# Patient Record
Sex: Male | Born: 1950 | Race: White | Hispanic: No | Marital: Married | Smoking: Never smoker
Health system: Southern US, Community
[De-identification: ages and names within clinical notes are randomized; demographics above are authoritative.]

## PROBLEM LIST (undated history)

## (undated) DIAGNOSIS — Z973 Presence of spectacles and contact lenses: Secondary | ICD-10-CM

## (undated) DIAGNOSIS — R140 Abdominal distension (gaseous): Secondary | ICD-10-CM

## (undated) DIAGNOSIS — E7800 Pure hypercholesterolemia, unspecified: Secondary | ICD-10-CM

## (undated) DIAGNOSIS — R42 Dizziness and giddiness: Secondary | ICD-10-CM

## (undated) DIAGNOSIS — K219 Gastro-esophageal reflux disease without esophagitis: Secondary | ICD-10-CM

## (undated) DIAGNOSIS — R109 Unspecified abdominal pain: Secondary | ICD-10-CM

## (undated) DIAGNOSIS — K439 Ventral hernia without obstruction or gangrene: Secondary | ICD-10-CM

## (undated) DIAGNOSIS — C801 Malignant (primary) neoplasm, unspecified: Secondary | ICD-10-CM

## (undated) HISTORY — PX: CYSTECTOMY: SUR359

## (undated) HISTORY — PX: HERNIA REPAIR: SHX51

## (undated) HISTORY — DX: Dizziness and giddiness: R42

## (undated) HISTORY — PX: KNEE ARTHROSCOPY: SUR90

## (undated) HISTORY — DX: Unspecified abdominal pain: R10.9

## (undated) HISTORY — DX: Abdominal distension (gaseous): R14.0

## (undated) HISTORY — DX: Ventral hernia without obstruction or gangrene: K43.9

## (undated) HISTORY — DX: Gastro-esophageal reflux disease without esophagitis: K21.9

---

## 2001-05-31 DIAGNOSIS — D229 Melanocytic nevi, unspecified: Secondary | ICD-10-CM

## 2001-05-31 HISTORY — DX: Melanocytic nevi, unspecified: D22.9

## 2001-08-23 ENCOUNTER — Ambulatory Visit (HOSPITAL_COMMUNITY): Admission: RE | Admit: 2001-08-23 | Discharge: 2001-08-23 | Payer: Self-pay | Admitting: Pulmonary Disease

## 2006-02-24 ENCOUNTER — Ambulatory Visit (HOSPITAL_COMMUNITY): Admission: RE | Admit: 2006-02-24 | Discharge: 2006-02-24 | Payer: Self-pay | Admitting: Pulmonary Disease

## 2006-03-05 ENCOUNTER — Ambulatory Visit (HOSPITAL_COMMUNITY): Admission: RE | Admit: 2006-03-05 | Discharge: 2006-03-05 | Payer: Self-pay | Admitting: Pulmonary Disease

## 2006-03-25 DIAGNOSIS — C4492 Squamous cell carcinoma of skin, unspecified: Secondary | ICD-10-CM

## 2006-03-25 HISTORY — DX: Squamous cell carcinoma of skin, unspecified: C44.92

## 2006-10-26 ENCOUNTER — Emergency Department (HOSPITAL_COMMUNITY): Admission: EM | Admit: 2006-10-26 | Discharge: 2006-10-27 | Payer: Self-pay | Admitting: Emergency Medicine

## 2006-10-28 ENCOUNTER — Encounter (INDEPENDENT_AMBULATORY_CARE_PROVIDER_SITE_OTHER): Payer: Self-pay | Admitting: Specialist

## 2006-10-28 ENCOUNTER — Ambulatory Visit (HOSPITAL_COMMUNITY): Admission: RE | Admit: 2006-10-28 | Discharge: 2006-10-28 | Payer: Self-pay | Admitting: General Surgery

## 2007-04-14 ENCOUNTER — Ambulatory Visit: Payer: Self-pay | Admitting: Orthopedic Surgery

## 2007-04-28 ENCOUNTER — Ambulatory Visit: Payer: Self-pay | Admitting: Orthopedic Surgery

## 2008-07-17 ENCOUNTER — Ambulatory Visit (HOSPITAL_COMMUNITY): Admission: RE | Admit: 2008-07-17 | Discharge: 2008-07-17 | Payer: Self-pay | Admitting: Pulmonary Disease

## 2008-08-30 ENCOUNTER — Ambulatory Visit (HOSPITAL_COMMUNITY): Admission: RE | Admit: 2008-08-30 | Discharge: 2008-08-30 | Payer: Self-pay | Admitting: Internal Medicine

## 2008-08-30 ENCOUNTER — Ambulatory Visit: Payer: Self-pay | Admitting: Internal Medicine

## 2010-02-21 ENCOUNTER — Encounter: Payer: Self-pay | Admitting: Internal Medicine

## 2010-03-13 ENCOUNTER — Ambulatory Visit (HOSPITAL_COMMUNITY): Admission: RE | Admit: 2010-03-13 | Discharge: 2010-03-13 | Payer: Self-pay | Admitting: Internal Medicine

## 2010-03-13 ENCOUNTER — Ambulatory Visit: Payer: Self-pay | Admitting: Internal Medicine

## 2010-10-04 ENCOUNTER — Emergency Department (HOSPITAL_COMMUNITY)
Admission: EM | Admit: 2010-10-04 | Discharge: 2010-10-04 | Payer: Self-pay | Source: Home / Self Care | Admitting: Emergency Medicine

## 2010-11-21 NOTE — Letter (Signed)
Summary: TRIAGE ORDER  TRIAGE ORDER   Imported By: Ave Filter 02/21/2010 11:42:22  _____________________________________________________________________  External Attachment:    Type:   Image     Comment:   External Document  Appended Document: TRIAGE ORDER ok

## 2011-03-04 NOTE — Op Note (Signed)
Jeffrey [name], Jeffrey [name] NO.:  [id number]   MEDICAL RECORD NO.:  [id number]          PATIENT TYPE:  AMB   LOCATION:  DAY                           FACILITY:  [facility]   PHYSICIAN:  R. Roetta Sessions, M.D. DATE OF BIRTH:  1951-10-06   DATE OF PROCEDURE:  08/30/2008  DATE OF DISCHARGE:                               OPERATIVE REPORT   INDICATIONS FOR PROCEDURE:  A 60 year old gentleman with no lower GI  tract symptoms who comes referral from Dr. Juanetta Gosling for colorectal cancer  screening.  He has never had his lower GI tract imaged.  There is no  family history of polyps or colon cancer.  The risks, benefits,  alternatives, and limitations have been reviewed.  Questions are  answered.  Please see the documentation in the medical record.   PROCEDURE NOTE:  O2 saturation, blood pressure, pulse, and respirations  were monitored throughout the entire procedure.  Conscious sedation of  Versed 4 mg IV, Demerol 100 mg IV in divided doses.   INSTRUMENT:  Pentax video chip system.   FINDINGS:  Digital rectal exam revealed no abnormalities.   Endoscopic findings:  The prep was good.   Colon:  Colonic mucosa was surveyed from the rectosigmoid junction  through the left transverse, right colon to the appendiceal orifice,  ileocecal valve, and cecum.  These structures were well seen and  photographed for the record.  Terminal ileum was intubated to 10 cm.  From this level, scope was slowly and cautiously withdrawn.  All  previously mentioned mucosal surfaces were again seen.  The patient had  few scattered shallow sigmoid diverticula in colonic mucosa.  Terminal  ileum mucosa appeared normal.  The scope was pulled down into the rectum  where thorough examination of rectal mucosa including retroflexion and  anteversion demonstrated no abnormalities, confirmed small anal  dilatation.  He tolerated the procedure well and was a reactive  endoscopy.   IMPRESSION:  1. Small anal  papilla, otherwise normal rectum.  2. A few shallow sigmoid diverticula in colonic mucosa.  Terminal      ileal mucosa appeared normal.   RECOMMENDATIONS:  1. Diverticulosis literature provided to Mr. Neighbors.  2. Repeat screening colonoscopy in 10 years.      Jonathon Bellows, M.D.  Electronically Signed     RMR/MEDQ  D:  08/30/2008  T:  08/30/2008  Job:  [id number]   cc:   Ramon Dredge L. Juanetta Gosling, M.D.  Fax: [phone]

## 2011-03-07 NOTE — H&P (Signed)
NAMEJUNAID, [name] NO.:  [id number]   MEDICAL RECORD NO.:  [id number]          PATIENT TYPE:  AMB   LOCATION:  SDS                          FACILITY:  [facility]   PHYSICIAN:  Anselm Pancoast. Weatherly, M.D.DATE OF BIRTH:  1951/09/24   DATE OF ADMISSION:  10/28/2006  DATE OF DISCHARGE:  10/28/2006                              HISTORY & PHYSICAL   CHIEF COMPLAINT:  Pain at the umbilicus.   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  He said, about 3 weeks ago, he  started noticing a little bulge at his umbilicus that was intermittently  painful.  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  He treated himself with mild pain medicine that evening,  started having more pain, and presented to the emergency room in  [place] where they did extensive lab work and a CT of the abdomen.  The CT showed a little, small umbilical hernia with incarcerated  preperitoneal fat.  There was no evidence of any loops of small bowel or  other abnormalities.  Some stool in his colon, but no diverticulitis.  He does have an enlarged prostate, and his bladder was not all prominent  when he had the CT.  Lab studies showed a normal white count, a negative  urine and CMET was negative, and they advised that he see a surgeon  promptly, and Dr. Juanetta Gosling suggested that he see [place] and was seen in the  urgent office yesterday.   EXAM:  On exam, he was acting as if he was having more pain, but it is  all real localized at the umbilicus, and you could reduce the little  preperitoneal fatty mass, but with straining, it would pop back out  again, and he desired to proceed on with a fairly urgent  umbilical  hernia repair.   HISTORY:  He has been on Flomax previously, and he is now on Avodart.  Says he gets up 1-2 times at night, thinks that he can void a  fairly  significant quantity, but he certainly is noticing a very low stream  flow and etc,.  I discussed about that he needed to see a urologist, as  I am wondering if the straining from that could be contributing to the  little hernia.  He felt that his symptoms were bad enough that he needed  to go ahead and proceed with the hernia repair and then if he is still  having symptoms, then see a urologist.  I did get a urine culture on him  this morning, prior to the repair of the hernia.   CURRENT MEDICATIONS:  He is on Avodart 0.5 p.o. in the morning.  He took  his tablet this morning.  Advil p.r.n.  Percocet that  he has been for  the last 36 hours for the hernia, that was prescribed in the ER.  I  discussed with him that the Percocet frequently makes it very difficult  to void and would try to get off the medicine as soon as possible.   PAST MEDICAL HISTORY:  He has no history of cardiac or hypertensive  issues.  The medicines are listed above.   FAMILY HISTORY:  I think his parents had cardiac issues.   PAST SURGICAL HISTORY:  I do not think he has had any previous  surgeries.   SOCIAL HISTORY:  His wife was with him when I saw him yesterday.   PHYSICAL EXAM:  His temperature was 98.3, pulse 72, respirations 16.  Blood pressure is 130/90.  He is 5 feet 8 inches, weighs 185 pounds.  Eyes, ears, nose and throat are unremarkable.  He is well-hydrated.  Lungs are clear.  Cardiac:  Normal sinus rhythm.  Abdomen:  He has a  soft abdomen with good bowel sounds and has voided today without  problems.  He has been n.p.o. since midnight.   IMPRESSION:  1. Small symptomatic umbilical hernia.  2. History of benign prostatic hypertrophy, on Avodart.   PLAN:  The patient will have repair of this umbilical hernia today with   general anesthesia with local in the wound and hopefully, can be  released after OR, providing that he is voiding without problems.  He  understands that he should not be doing any strenuous lifting, etc, for  approximately 3 weeks following the surgery.           ______________________________  Anselm Pancoast. Zachery Dakins, M.D.     WJW/MEDQ  D:  10/28/2006  T:  10/29/2006  Job:  [id number]

## 2011-03-07 NOTE — Op Note (Signed)
Jeffrey, [name] NO.:  [id number]   MEDICAL RECORD NO.:  [id number]          PATIENT TYPE:  AMB   LOCATION:  SDS                          FACILITY:  [facility]   PHYSICIAN:  Anselm Pancoast. Weatherly, M.D.DATE OF BIRTH:  16-Mar-1951   DATE OF PROCEDURE:  10/28/2006  DATE OF DISCHARGE:  10/28/2006                               OPERATIVE REPORT   PREOPERATIVE DIAGNOSIS:  Small incarcerated umbilical hernia.   POSTOPERATIVE DIAGNOSIS:  Small incarcerated umbilical hernia.   OPERATION:  Repair of small incarcerated umbilical hernia.   SURGEON:  Anselm Pancoast. Zachery Dakins, M.D.   ASSISTANT:  Nurse.   ANESTHESIA:  General anesthesia.   HISTORY:  Jeffrey Witherington. [name] is a 60 year old male, who was referred to our  office from the emergency room at [facility] 2 nights earlier, after he  presented their with a pain right at his navel with the following  history.  He runs an exercise center.  He does not really do real  strenuous activity, he said; but approximately 3 weeks earlier, he had  noticed a little bulge at his umbilicus that would intermittently swell  up, and then it would kind of resolve.  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  And as this became more painful and was definitely swollen, he presented  to the emergency room, where he was seen in the early a.m. 2 days  earlier.  They did extensive blood work and urinalysis and a CT that  showed a small incarcerated umbilical hernia,  about the size of a  grape.  There was no bowel up in the area.  There were no diverticulitis  or other problems.  He does have a past history of prostate problems,  for which he is on Avodart now.  He has been on Flomax previously, and  definitely he is having kind of BPH-type symptoms intermittently.  He  says that he voids about 1 time per night, and he  says there has not  really been any actual change of this.  I saw him in the office  yesterday and could reduce the little hernia.  When he strains and  pushes it back out, he is symptomatic enough that he wanted to proceed  on with urgent repair.  I have some concern about the prostate issue,  but on reviewing the CT with the radiologist this morning, he definitely  has an enlarged prostate.  He does not have dilatation of his ureters or  calices.  He has got some cysts, more on the left than right, in the  center portion of his kidneys, and I did get a clean-catch urine for  culture and sensitivity this morning when he came in.   DESCRIPTION OF PROCEDURE:  Preoperatively, the patient was taken to the  operating suite.  We identified him and he had received 1 g of Kefzol.  Induction of general anesthesia, endotracheal tube.  And  then the  abdomen, after clipping the hair around the umbilicus, was prepped with  Betadine Surgical Scrubbing Solution and draped in a sterile manner.  A  small transverse incision below the umbilicus was made.  The hernia sac  was separated from the surrounding skin over the umbilicus, and there  was a well-developed little hernia sac with incarcerated preperitoneal  fat in the area.  This was separated circumferentially, and the actual  little area of fatty tissue that was protruding was actually amputated,  ligating the pedicle with a 3-0 Vicryl.  The hernia sac was likewise  removed.  This defect itself was about the size of a small fingernail,  or a centimeter size, and I elected not to place any mesh.  I did place  transversely interrupted sutures of 0 Surgilon.  The subcutaneous tissue  was closed with 3-0 Vicryl to pull the skin down at the umbilicus, and I  had anesthetized the fascia and the peritoneum with about 10 mL  Sensorcaine 0.25%, and then also used the same in the subcutaneous  tissue.  Three subcutaneous stitches were placed, and then the  skin was  closed with intermittent 5-0 nylon.  I put antibiotic ointment on the  sutures, and then kind of rolled up a 4 x 4 to kind of press the  umbilicus, and then 4 x 4's over this.  If the patient can void without  problems in the recovery room, he can be released today; and he is aware  that he should tolerate a normal amount of fluids - do not over do it -  since he certainly is having problems with his prostate.  Dr. Kari Baars in [place] is his regular physician, but it may be necessary  that he see a urologist if he continues to have more symptoms with his  prostate.  The patient is 60 years of old, and, of course, had an  enlarged prostate on the CT.           ______________________________  Anselm Pancoast. Zachery Dakins, M.D.     WJW/MEDQ  D:  10/28/2006  T:  10/28/2006  Job:  [id number]   cc:   Jeffrey [name], M.D.

## 2011-07-07 NOTE — Progress Notes (Unsigned)
Per RMR. Ok to call in zegerid 40mg  qd #30 [id number]. Rx called to [facility]/[place].

## 2011-11-04 ENCOUNTER — Other Ambulatory Visit: Payer: Self-pay | Admitting: Physician Assistant

## 2011-11-04 DIAGNOSIS — C4491 Basal cell carcinoma of skin, unspecified: Secondary | ICD-10-CM

## 2011-11-04 DIAGNOSIS — C4492 Squamous cell carcinoma of skin, unspecified: Secondary | ICD-10-CM

## 2011-11-04 HISTORY — DX: Basal cell carcinoma of skin, unspecified: C44.91

## 2011-11-04 HISTORY — DX: Squamous cell carcinoma of skin, unspecified: C44.92

## 2011-11-28 ENCOUNTER — Encounter (HOSPITAL_COMMUNITY): Payer: Self-pay | Admitting: Pharmacy Technician

## 2011-12-08 ENCOUNTER — Other Ambulatory Visit: Payer: Self-pay

## 2011-12-08 ENCOUNTER — Encounter (HOSPITAL_COMMUNITY): Payer: Self-pay

## 2011-12-08 ENCOUNTER — Encounter (HOSPITAL_COMMUNITY)
Admission: RE | Admit: 2011-12-08 | Discharge: 2011-12-08 | Disposition: A | Discharge status: Home / Self Care | Payer: 59 | Source: Ambulatory Visit | Attending: Podiatry | Admitting: Podiatry

## 2011-12-08 HISTORY — DX: Malignant (primary) neoplasm, unspecified: C80.1

## 2011-12-08 HISTORY — DX: Pure hypercholesterolemia, unspecified: E78.00

## 2011-12-08 LAB — HEMOGLOBIN AND HEMATOCRIT, BLOOD
HCT: 40.5 % (ref 39.0–52.0)
Hemoglobin: 14 g/dL (ref 13.0–17.0)

## 2011-12-08 LAB — BASIC METABOLIC PANEL
GFR calc Af Amer: >90 mL/min (ref >90)
GFR calc non Af Amer: 90 mL/min — ABNORMAL LOW (ref >90)
Glucose, Bld: 112 mg/dL — ABNORMAL HIGH (ref 70–99)
Potassium: 4.9 mEq/L (ref 3.5–5.1)

## 2011-12-08 LAB — SURGICAL PCR SCREEN: Staphylococcus aureus: NEGATIVE

## 2011-12-08 MED ORDER — CEFAZOLIN SODIUM-DEXTROSE 2-3 GM-% IV SOLR: 2.0000 g | INTRAVENOUS | Status: DC

## 2011-12-08 NOTE — Patient Instructions (Addendum)
[address] SP[address]LER  12/08/2011   Your procedure is scheduled on:  12/11/2011  Report to [facility] at  830  AM.  Call this number if you have problems the morning of surgery: [phone]   Remember:   Do not eat food:After Midnight.  May have clear liquids:until Midnight .  Clear liquids include soda, tea, black coffee, apple or grape juice, broth.  Take these medicines the morning of surgery with A SIP OF WATER: flonase   Do not wear jewelry, make-up or nail polish.  Do not wear lotions, powders, or perfumes. You may wear deodorant.  Do not shave 48 hours prior to surgery.  Do not bring valuables to the hospital.  Contacts, dentures or bridgework may not be worn into surgery.  Leave suitcase in the car. After surgery it may be brought to your room.  For patients admitted to the hospital, checkout time is 11:00 AM the day of discharge.   Patients discharged the day of surgery will not be allowed to drive home.  Name and phone number of your driver: family  Special Instructions: CHG Shower Use Special Wash: 1/2 bottle night before surgery and 1/2 bottle morning of surgery.   Please read over the following fact sheets that you were given: Pain Booklet, MRSA Information, Surgical Site Infection Prevention, Anesthesia Post-op Instructions and Care and Recovery After Surgery PATIENT INSTRUCTIONS POST-ANESTHESIA  IMMEDIATELY FOLLOWING SURGERY:  Do not drive or operate machinery for the first twenty four hours after surgery.  Do not make any important decisions for twenty four hours after surgery or while taking narcotic pain medications or sedatives.  If you develop intractable nausea and vomiting or a severe headache please notify your doctor immediately.  FOLLOW-UP:  Please make an appointment with your surgeon as instructed. You do not need to follow up with anesthesia unless specifically instructed to do so.  WOUND CARE INSTRUCTIONS (if applicable):  Keep a dry clean dressing on the  anesthesia/puncture wound site if there is drainage.  Once the wound has quit draining you may leave it open to air.  Generally you should leave the bandage intact for twenty four hours unless there is drainage.  If the epidural site drains for more than 36-48 hours please call the anesthesia department.  QUESTIONS?:  Please feel free to call your physician or the hospital operator if you have any questions, and they will be happy to assist you.     [facility] Anesthesia Department [address] [place] [phone]

## 2011-12-10 NOTE — H&P (Signed)
Chief Complaint / History of Present Illness: This 61 year old male returns today for a consultation to discusses the proposed surgery.  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  Past Medical History:  Dermatologic Hx: (+) skin cancer.   Cardiovascular Hx: (+) high cholesterol.   Medication History: Active: crestor (active), Aspirin 81 mg (active).   Allergies: No known medical allergies Past Surgical History: Patient/Guardian admits past surgical history of hernia repair, leg surgery, skin cancer removed in 2013.   Social History: Patient/Guardian denies alcohol use, Patient/Guardian denies tobacco use, Patient/Guardian denies illegal drug use.   Family History: Patient/Guardian admits a family history of diabetes associated with father, mother, cancer of bladder associated with father, hypertension associated with father, mother, cancer of breast associated with mother.   Review of Systems: No abnormal findings with the exception of the chief complaint.    Physical Exam: The patient is a pleasant, 61 year old male in no apparent distress.  He is oriented to person, place and time.  He is ambulatory.  Skin is warm, dry and supple.  All nails are well trimmed and free of pathology.  Pedal hair growth and distribution is normal.  No hyperkeratotic lesions are present.  No ulceration are present.  Dorsalis pedis and posterior tibial pulses measure 2/4.  Capillary refill time is immediate.  No pedal edema is present.  Muscle tone is normal.  Muscle strength is 5/5 with regards to dorsiflexion, plantarflexion, inversion and eversion.   There is a firm, palpable tender mass noted along the plantar aspect of the arch of the left foot.  The finding is consistent with a plantar fibroma.  There is pain with palpation of the 2nd MTPJ of the left foot.  Digital drawer is negative.  Light touch sensation is grossly intact.    Test Results:  None to  report at this time.    Impression:  Plantar fibroma of the left foot  Plan:   The podiatric pathology and treatment options were again reviewed with the him.  I discussed with the him the surgical procedure itself, the indications, the risks, possible complications, post-operative course and alternative treatments. I gave no guarantees regarding the outcome. He would like to proceed with the proposed surgery.  An informed consent was obtained.  He is to return for his post-operative appointment or sooner if problems arise.  Prescriptions: Rx: Crutches-  ,  Refills: 0.  Allow Generic: Yes Rx: Walker-  ,  Refills: 0.  Allow Generic: Yes Rx: Phenergan- 12.5 mg tablet , Take 1 tablet by mouth every four to six hours as needed. for nausea or vomiting. Max daily dose: 6.  Dispense: 21 tablet. Refills: 1.  Allow Generic: Yes Rx: Percocet- 325 mg-10mg  tablet , Take 1 tablet by mouth every six hours as needed for pain. Please eat before taking this medicaiton.. Max daily dose: 4.  Dispense: 56 tablet. Refills: 0.  Allow Generic: Yes

## 2011-12-11 ENCOUNTER — Encounter (HOSPITAL_COMMUNITY): Payer: Self-pay | Admitting: *Deleted

## 2011-12-11 ENCOUNTER — Ambulatory Visit (HOSPITAL_COMMUNITY)
Admission: RE | Admit: 2011-12-11 | Discharge: 2011-12-11 | Disposition: A | Discharge status: Home / Self Care | Payer: 59 | Source: Ambulatory Visit | Attending: Podiatry | Admitting: Podiatry

## 2011-12-11 ENCOUNTER — Ambulatory Visit (HOSPITAL_COMMUNITY): Payer: 59 | Admitting: Anesthesiology

## 2011-12-11 ENCOUNTER — Encounter (HOSPITAL_COMMUNITY): Payer: Self-pay | Admitting: Anesthesiology

## 2011-12-11 ENCOUNTER — Encounter (HOSPITAL_COMMUNITY)
Admission: RE | Disposition: A | Discharge status: Home / Self Care | Payer: Self-pay | Source: Ambulatory Visit | Attending: Podiatry

## 2011-12-11 ENCOUNTER — Other Ambulatory Visit: Payer: Self-pay

## 2011-12-11 DIAGNOSIS — Z01810 Encounter for preprocedural cardiovascular examination: Secondary | ICD-10-CM | POA: Insufficient documentation

## 2011-12-11 DIAGNOSIS — Z01812 Encounter for preprocedural laboratory examination: Secondary | ICD-10-CM | POA: Insufficient documentation

## 2011-12-11 DIAGNOSIS — Z7982 Long term (current) use of aspirin: Secondary | ICD-10-CM | POA: Insufficient documentation

## 2011-12-11 DIAGNOSIS — E7800 Pure hypercholesterolemia, unspecified: Secondary | ICD-10-CM | POA: Insufficient documentation

## 2011-12-11 DIAGNOSIS — M722 Plantar fascial fibromatosis: Secondary | ICD-10-CM

## 2011-12-11 DIAGNOSIS — D2370 Other benign neoplasm of skin of unspecified lower limb, including hip: Secondary | ICD-10-CM | POA: Insufficient documentation

## 2011-12-11 HISTORY — PX: MASS EXCISION: SHX2000

## 2011-12-11 SURGERY — EXCISION MASS
Anesthesia: Monitor Anesthesia Care | Site: Foot | Laterality: Left | Wound class: Clean

## 2011-12-11 MED ORDER — BUPIVACAINE HCL (PF) 0.5 % IJ SOLN
INTRAMUSCULAR | Status: DC | PRN
  Administered 2011-12-11: 20 mL

## 2011-12-11 MED ORDER — MIDAZOLAM HCL 2 MG/2ML IJ SOLN
INTRAMUSCULAR | Status: AC
  Filled 2011-12-11: qty 2

## 2011-12-11 MED ORDER — MIDAZOLAM HCL 2 MG/2ML IJ SOLN
INTRAMUSCULAR | Status: AC
  Administered 2011-12-11: 2 mg via INTRAVENOUS
  Filled 2011-12-11: qty 2

## 2011-12-11 MED ORDER — FENTANYL CITRATE 0.05 MG/ML IJ SOLN
INTRAMUSCULAR | Status: DC | PRN
  Administered 2011-12-11 (×2): 50 ug via INTRAVENOUS

## 2011-12-11 MED ORDER — SODIUM CHLORIDE 0.9 % IR SOLN
Status: DC | PRN
  Administered 2011-12-11: 1000 mL

## 2011-12-11 MED ORDER — LIDOCAINE HCL (CARDIAC) 10 MG/ML IV SOLN
INTRAVENOUS | Status: DC | PRN
  Administered 2011-12-11: 25 mg via INTRAVENOUS

## 2011-12-11 MED ORDER — LACTATED RINGERS IV SOLN
INTRAVENOUS | Status: DC | PRN
  Administered 2011-12-11 (×2): via INTRAVENOUS

## 2011-12-11 MED ORDER — POVIDONE-IODINE 10 % EX SOLN
CUTANEOUS | Status: DC | PRN
  Administered 2011-12-11: 1 via TOPICAL

## 2011-12-11 MED ORDER — BUPIVACAINE HCL (PF) 0.5 % IJ SOLN
INTRAMUSCULAR | Status: AC
  Filled 2011-12-11: qty 30

## 2011-12-11 MED ORDER — MIDAZOLAM HCL 2 MG/2ML IJ SOLN
1.0000 mg | INTRAMUSCULAR | Status: DC | PRN
  Administered 2011-12-11: 2 mg via INTRAVENOUS

## 2011-12-11 MED ORDER — LIDOCAINE HCL (PF) 1 % IJ SOLN
INTRAMUSCULAR | Status: AC
  Filled 2011-12-11: qty 5

## 2011-12-11 MED ORDER — CEFAZOLIN SODIUM 1-5 GM-% IV SOLN
INTRAVENOUS | Status: DC | PRN
  Administered 2011-12-11: 2 g via INTRAVENOUS

## 2011-12-11 MED ORDER — PROPOFOL 10 MG/ML IV EMUL
INTRAVENOUS | Status: AC
  Filled 2011-12-11: qty 20

## 2011-12-11 MED ORDER — PROPOFOL 10 MG/ML IV EMUL
INTRAVENOUS | Status: DC | PRN
  Administered 2011-12-11: 75 ug/kg/min via INTRAVENOUS

## 2011-12-11 MED ORDER — MIDAZOLAM HCL 5 MG/5ML IJ SOLN
INTRAMUSCULAR | Status: DC | PRN
  Administered 2011-12-11 (×2): 1 mg via INTRAVENOUS

## 2011-12-11 MED ORDER — CEFAZOLIN SODIUM-DEXTROSE 2-3 GM-% IV SOLR: 2.0000 g | Freq: Once | INTRAVENOUS | Status: DC

## 2011-12-11 MED ORDER — ONDANSETRON HCL 4 MG/2ML IJ SOLN: 4.0000 mg | Freq: Once | INTRAMUSCULAR | Status: DC | PRN

## 2011-12-11 MED ORDER — FENTANYL CITRATE 0.05 MG/ML IJ SOLN: 25.0000 ug | INTRAMUSCULAR | Status: DC | PRN

## 2011-12-11 MED ORDER — LACTATED RINGERS IV SOLN
INTRAVENOUS | Status: DC
  Administered 2011-12-11: 1000 mL via INTRAVENOUS

## 2011-12-11 MED ORDER — CEFAZOLIN SODIUM 1-5 GM-% IV SOLN
INTRAVENOUS | Status: AC
  Filled 2011-12-11: qty 100

## 2011-12-11 MED ORDER — FENTANYL CITRATE 0.05 MG/ML IJ SOLN
INTRAMUSCULAR | Status: AC
  Filled 2011-12-11: qty 2

## 2011-12-11 SURGICAL SUPPLY — 39 items
BAG HAMPER (MISCELLANEOUS) ×2 IMPLANT
BANDAGE ELASTIC 4 VELCRO NS (GAUZE/BANDAGES/DRESSINGS) ×2 IMPLANT
BANDAGE ESMARK 4X12 BL STRL LF (DISPOSABLE) ×1 IMPLANT
BANDAGE GAUZE ELAST BULKY 4 IN (GAUZE/BANDAGES/DRESSINGS) ×2 IMPLANT
BENZOIN TINCTURE PRP APPL 2/3 (GAUZE/BANDAGES/DRESSINGS) ×2 IMPLANT
BLADE SURG 15 STRL LF DISP TIS (BLADE) ×2 IMPLANT
BLADE SURG 15 STRL SS (BLADE) ×2
BNDG ESMARK 4X12 BLUE STRL LF (DISPOSABLE) ×2
CHLORAPREP W/TINT 26ML (MISCELLANEOUS) ×2 IMPLANT
CLOTH BEACON ORANGE TIMEOUT ST (SAFETY) ×2 IMPLANT
COVER LIGHT HANDLE STERIS (MISCELLANEOUS) ×6 IMPLANT
CUFF TOURNIQUET SINGLE 18IN (TOURNIQUET CUFF) IMPLANT
DECANTER SPIKE VIAL GLASS SM (MISCELLANEOUS) ×2 IMPLANT
DRSG ADAPTIC 3X8 NADH LF (GAUZE/BANDAGES/DRESSINGS) ×2 IMPLANT
DURA STEPPER MED (CAST SUPPLIES) ×2 IMPLANT
ELECT REM PT RETURN 9FT ADLT (ELECTROSURGICAL) ×2
ELECTRODE REM PT RTRN 9FT ADLT (ELECTROSURGICAL) ×1 IMPLANT
GLOVE BIO SURGEON STRL SZ7.5 (GLOVE) ×2 IMPLANT
GLOVE ECLIPSE 6.5 STRL STRAW (GLOVE) ×2 IMPLANT
GLOVE EXAM NITRILE MD LF STRL (GLOVE) ×2 IMPLANT
GLOVE INDICATOR 7.0 STRL GRN (GLOVE) ×4 IMPLANT
GOWN STRL REIN XL XLG (GOWN DISPOSABLE) ×6 IMPLANT
KIT ROOM TURNOVER AP CYSTO (KITS) ×2 IMPLANT
MANIFOLD NEPTUNE II (INSTRUMENTS) ×2 IMPLANT
NEEDLE HYPO 27GX1-1/4 (NEEDLE) ×6 IMPLANT
NS IRRIG 1000ML POUR BTL (IV SOLUTION) ×2 IMPLANT
PACK BASIC LIMB (CUSTOM PROCEDURE TRAY) ×2 IMPLANT
PAD ARMBOARD 7.5X6 YLW CONV (MISCELLANEOUS) ×2 IMPLANT
SET BASIN LINEN APH (SET/KITS/TRAYS/PACK) ×2 IMPLANT
SPONGE GAUZE 4X4 12PLY (GAUZE/BANDAGES/DRESSINGS) ×2 IMPLANT
STRIP CLOSURE SKIN 1/2X4 (GAUZE/BANDAGES/DRESSINGS) IMPLANT
STRIP CLOSURE SKIN 1/4X3 (GAUZE/BANDAGES/DRESSINGS) ×4 IMPLANT
SUT MON AB 5-0 PS2 18 (SUTURE) ×4 IMPLANT
SUT PROLENE 4 0 PS 2 18 (SUTURE) ×2 IMPLANT
SUT VIC AB 2-0 CT2 27 (SUTURE) IMPLANT
SUT VIC AB 4-0 PS2 27 (SUTURE) ×2 IMPLANT
SUT VICRYL AB 3-0 FS1 BRD 27IN (SUTURE) ×2 IMPLANT
SYR BULB IRRIGATION 50ML (SYRINGE) ×2 IMPLANT
SYR CONTROL 10ML LL (SYRINGE) ×4 IMPLANT

## 2011-12-11 NOTE — Transfer of Care (Signed)
Immediate Anesthesia Transfer of Care Note  Patient: Jeffrey [name]  Procedure(s) Performed: Procedure(s) (LRB): EXCISION MASS (Left)  Patient Location: PACU  Anesthesia Type: MAC  Level of Consciousness: awake, alert , oriented and patient cooperative  Airway & Oxygen Therapy: Patient Spontanous Breathing  Post-op Assessment: Report given to PACU RN and Post -op Vital signs reviewed and stable  Post vital signs: Reviewed and stable  Complications: No apparent anesthesia complications

## 2011-12-11 NOTE — Brief Op Note (Signed)
OPERATIVE REPORT  SURGEON:   Dallas Schimke, [place]  OR STAFF:   Rogene Houston, RN - Circulator Hurshel Party, CST - Scrub Person Lennox Pippins, RN - Circulator Assistant Sherri Sear, CST - Scrub Person   PREOPERATIVE DIAGNOSIS:   Plantar fibroma left foot  POSTOPERATIVE DIAGNOSIS: Plantar fibroma left foot  PROCEDURE: Excision of plantar fibroma left foot   ANESTHESIA:  Monitor Anesthesia Care   HEMOSTASIS:    Total Tourniquet Time Documented: Calf (Left) - 30 minutes; Tourniquet set at 250 mmHg  ESTIMATED BLOOD LOSS:   * No blood loss amount entered *  MATERIALS USED:  None  INJECTABLES: Marcaine 0.5% plain  PATHOLOGY:   Soft tissue mass left foot  COMPLICATIONS:   None  INDICATIONS:  Painful mass along the bottom of the left foot  DICTATION:  Other Dictation: Dictation Number [id number]

## 2011-12-11 NOTE — H&P (Signed)
HISTORY AND PHYSICAL INTERVAL NOTE:  12/11/2011  9:50 AM  Jeffrey [name]  has presented today for surgery, with the diagnosis of plantar fibroma.  The various methods of treatment have been discussed with the patient.  No guarantees were given.  After consideration of risks, benefits and other options for treatment, the patient has consented to surgery.  I have reviewed the patients' chart and labs.    Patient Vitals for the past 24 hrs:  BP Temp Temp src Pulse Resp SpO2  12/11/11 0925 148/89 mmHg - - - 19  98 %  12/11/11 0920 147/81 mmHg - - - 24  99 %  12/11/11 0915 148/86 mmHg - - - 25  98 %  12/11/11 0910 135/81 mmHg - - - 23  99 %  12/11/11 0906 135/81 mmHg - - - - 98 %  12/11/11 0905 - - - - 19  99 %  12/11/11 0851 - 97.7 F (36.5 C) Oral 76  16  -    A history and physical examination was performed in my office on 12/02/2011.  The patient was reexamined.  There have been no changes to this history and physical examination.  Dallas Schimke, DPM

## 2011-12-11 NOTE — Preoperative (Signed)
Beta Blockers   Reason not to administer Beta Blockers:Not Applicable 

## 2011-12-11 NOTE — Anesthesia Postprocedure Evaluation (Signed)
  Anesthesia Post-op Note  Patient: Jeffrey [name]  Procedure(s) Performed: Procedure(s) (LRB): EXCISION MASS (Left)  Patient Location: PACU  Anesthesia Type: MAC  Level of Consciousness: awake, alert , oriented and patient cooperative  Airway and Oxygen Therapy: Patient Spontanous Breathing  Post-op Pain: none  Post-op Assessment: Post-op Vital signs reviewed, Patient's Cardiovascular Status Stable, Respiratory Function Stable, Patent Airway and No signs of Nausea or vomiting  Post-op Vital Signs: Reviewed and stable  Complications: No apparent anesthesia complications

## 2011-12-11 NOTE — Anesthesia Preprocedure Evaluation (Signed)
Anesthesia Evaluation  Patient identified by MRN, date of birth, ID band Patient awake    Reviewed: Allergy & Precautions, H&P , NPO status , Patient's Chart, lab work & pertinent test results  History of Anesthesia Complications Negative for: history of anesthetic complications  Airway Mallampati: I      Dental  (+) Teeth Intact and Caps   Pulmonary neg pulmonary ROS,    Pulmonary exam normal       Cardiovascular neg cardio ROS Regular Normal    Neuro/Psych    GI/Hepatic   Endo/Other    Renal/GU      Musculoskeletal   Abdominal   Peds  Hematology   Anesthesia Other Findings   Reproductive/Obstetrics                           Anesthesia Physical Anesthesia Plan  ASA: II  Anesthesia Plan: MAC   Post-op Pain Management:    Induction:   Airway Management Planned: Nasal Cannula  Additional Equipment:   Intra-op Plan:   Post-operative Plan:   Informed Consent: I have reviewed the patients History and Physical, chart, labs and discussed the procedure including the risks, benefits and alternatives for the proposed anesthesia with the patient or authorized representative who has indicated his/her understanding and acceptance.     Plan Discussed with:   Anesthesia Plan Comments:         Anesthesia Quick Evaluation

## 2011-12-12 ENCOUNTER — Encounter (HOSPITAL_COMMUNITY): Payer: Self-pay | Admitting: Podiatry

## 2011-12-12 NOTE — Op Note (Signed)
NAMEDWAIN, HUHN NO.:  [id number]  MEDICAL RECORD NO.:  [id number]  LOCATION:  [facility]                          FACILITY:  [facility]  PHYSICIAN:  B. Theola Sequin, MD   DATE OF BIRTH:  [birth year]  DATE OF PROCEDURE:  12/11/2011 DATE OF DISCHARGE:  12/11/2011                              OPERATIVE REPORT   SURGEON:  B. Theola Sequin, MD  ASSISTANT:  None.  PREOPERATIVE DIAGNOSIS:  Plantar fibroma, left foot.  POSTOPERATIVE DIAGNOSIS:  Plantar fibroma, left foot.  PROCEDURE:  Excision of plantar fibroma, left foot.  ANESTHESIA:  MAC with local.  HEMOSTASIS:  Pneumatic ankle tourniquet at 250 mmHg.  MATERIALS:  None.  INJECTABLE:  0.5% Marcaine plain.  PATHOLOGY:  Soft tissue mass, left foot.  COMPLICATIONS:  None.  PROCEDURE IN DETAIL:  The patient was brought to the operating room, placed on the operative table in the supine position.  The pneumatic ankle tourniquet was placed about the patient's left ankle.  The foot was then anesthetized using 0.5% Marcaine plain.  The foot was scrubbed, prepped, and draped in the usual sterile manner.  The limb was then elevated, exsanguinated, and the pneumatic ankle tourniquet was inflated to 250 mmHg.  Attention was directed to the plantar surface of the left foot where the plantar fibroma was identified.  A curvilinear incision was made overlying the mass.  Dissection was continued deep down to the level of the plantar fascia where the plantar fibroma was identified.  The plantar fibroma was excised with a perimeter of normal-appearing plantar fibroma.  After excision of the plantar fibroma, the remaining plantar fascia was identified and evaluated.  No remaining abnormal soft tissue pathology was identified.  The wound was irrigated with copious amounts of sterile irrigant.  The subcutaneous structures were reapproximated using 4-0 Vicryl.  The skin was reapproximated using 5-0 Monocryl in a running  subcuticular manner.  The incision was reinforced with 4-0 Prolene in a horizontal mattress and simple suture technique.  Steri-Strips were applied.  A sterile compressive dressing was then applied to the operative foot.  The pneumatic ankle tourniquet was deflated and a prompt hyperemic response was noted to all digits of the operative foot.          ______________________________ B. Theola Sequin, MD     BIM/MEDQ  D:  12/11/2011  T:  12/12/2011  Job:  [id number]

## 2012-01-29 ENCOUNTER — Other Ambulatory Visit: Payer: Self-pay | Admitting: Physician Assistant

## 2012-03-30 ENCOUNTER — Inpatient Hospital Stay (HOSPITAL_COMMUNITY)
Admission: EM | Admit: 2012-03-30 | Discharge: 2012-04-06 | DRG: 331 | Disposition: A | Discharge status: Home / Self Care | Payer: 59 | Attending: General Surgery | Admitting: General Surgery

## 2012-03-30 ENCOUNTER — Inpatient Hospital Stay (HOSPITAL_COMMUNITY): Payer: 59 | Admitting: Anesthesiology

## 2012-03-30 ENCOUNTER — Encounter (HOSPITAL_COMMUNITY)
Admission: EM | Disposition: A | Discharge status: Home / Self Care | Payer: Self-pay | Source: Home / Self Care | Attending: General Surgery

## 2012-03-30 ENCOUNTER — Emergency Department (HOSPITAL_COMMUNITY): Payer: 59

## 2012-03-30 ENCOUNTER — Encounter (HOSPITAL_COMMUNITY): Payer: Self-pay

## 2012-03-30 ENCOUNTER — Encounter (HOSPITAL_COMMUNITY): Payer: Self-pay | Admitting: Anesthesiology

## 2012-03-30 DIAGNOSIS — E785 Hyperlipidemia, unspecified: Secondary | ICD-10-CM

## 2012-03-30 DIAGNOSIS — Z7982 Long term (current) use of aspirin: Secondary | ICD-10-CM

## 2012-03-30 DIAGNOSIS — R109 Unspecified abdominal pain: Secondary | ICD-10-CM

## 2012-03-30 DIAGNOSIS — E7800 Pure hypercholesterolemia, unspecified: Secondary | ICD-10-CM | POA: Diagnosis present

## 2012-03-30 DIAGNOSIS — D7289 Other specified disorders of white blood cells: Secondary | ICD-10-CM

## 2012-03-30 DIAGNOSIS — K631 Perforation of intestine (nontraumatic): Secondary | ICD-10-CM

## 2012-03-30 DIAGNOSIS — Z85828 Personal history of other malignant neoplasm of skin: Secondary | ICD-10-CM

## 2012-03-30 DIAGNOSIS — K219 Gastro-esophageal reflux disease without esophagitis: Secondary | ICD-10-CM | POA: Diagnosis present

## 2012-03-30 DIAGNOSIS — D72829 Elevated white blood cell count, unspecified: Secondary | ICD-10-CM | POA: Diagnosis present

## 2012-03-30 DIAGNOSIS — Z79899 Other long term (current) drug therapy: Secondary | ICD-10-CM

## 2012-03-30 HISTORY — PX: LAPAROTOMY: SHX154

## 2012-03-30 HISTORY — PX: BOWEL RESECTION: SHX1257

## 2012-03-30 LAB — COMPREHENSIVE METABOLIC PANEL
Albumin: 4.5 g/dL (ref 3.5–5.2)
BUN: 18 mg/dL (ref 6–23)
Chloride: 108 mEq/L (ref 96–112)
Creatinine, Ser: 0.83 mg/dL (ref 0.50–1.35)
Total Bilirubin: 0.4 mg/dL (ref 0.3–1.2)
Total Protein: 7.8 g/dL (ref 6.0–8.3)

## 2012-03-30 LAB — CBC
MCH: 31.8 pg (ref 26.0–34.0)
MCV: 93.4 fL (ref 78.0–100.0)
Platelets: 133 10*3/uL — ABNORMAL LOW (ref 150–400)
RDW: 13 % (ref 11.5–15.5)
WBC: 16.7 10*3/uL — ABNORMAL HIGH (ref 4.0–10.5)

## 2012-03-30 LAB — DIFFERENTIAL
Basophils Absolute: 0 10*3/uL (ref 0.0–0.1)
Basophils Relative: 0 % (ref 0–1)
Eosinophils Absolute: 0.1 10*3/uL (ref 0.0–0.7)
Eosinophils Relative: 0 % (ref 0–5)

## 2012-03-30 LAB — LIPASE, BLOOD: Lipase: 32 U/L (ref 11–59)

## 2012-03-30 LAB — URINALYSIS, ROUTINE W REFLEX MICROSCOPIC
Bilirubin Urine: NEGATIVE
Ketones, ur: NEGATIVE mg/dL
Nitrite: NEGATIVE
Urobilinogen, UA: 0.2 mg/dL (ref 0.0–1.0)

## 2012-03-30 LAB — SURGICAL PCR SCREEN: MRSA, PCR: NEGATIVE

## 2012-03-30 SURGERY — LAPAROTOMY, EXPLORATORY
Anesthesia: General | Site: Abdomen | Wound class: Clean Contaminated

## 2012-03-30 MED ORDER — HYDROMORPHONE HCL PF 1 MG/ML IJ SOLN
1.0000 mg | INTRAMUSCULAR | Status: DC | PRN
  Administered 2012-03-30: 1 mg via INTRAVENOUS
  Filled 2012-03-30: qty 1

## 2012-03-30 MED ORDER — FENTANYL CITRATE 0.05 MG/ML IJ SOLN
INTRAMUSCULAR | Status: AC
  Filled 2012-03-30: qty 5

## 2012-03-30 MED ORDER — ROCURONIUM BROMIDE 100 MG/10ML IV SOLN
INTRAVENOUS | Status: DC | PRN
  Administered 2012-03-30: 15 mg via INTRAVENOUS
  Administered 2012-03-30: 10 mg via INTRAVENOUS
  Administered 2012-03-30: 40 mg via INTRAVENOUS

## 2012-03-30 MED ORDER — SODIUM CHLORIDE 0.9 % IV SOLN: INTRAVENOUS | Status: DC

## 2012-03-30 MED ORDER — SUCCINYLCHOLINE CHLORIDE 20 MG/ML IJ SOLN
INTRAMUSCULAR | Status: DC | PRN
  Administered 2012-03-30: 160 mg via INTRAVENOUS

## 2012-03-30 MED ORDER — ERTAPENEM SODIUM 1 G IJ SOLR
1.0000 g | INTRAMUSCULAR | Status: DC
  Filled 2012-03-30: qty 1

## 2012-03-30 MED ORDER — PHENOL 1.4 % MT LIQD
1.0000 | OROMUCOSAL | Status: DC | PRN
  Filled 2012-03-30: qty 177

## 2012-03-30 MED ORDER — POTASSIUM CHLORIDE IN NACL 20-0.9 MEQ/L-% IV SOLN
INTRAVENOUS | Status: DC
  Administered 2012-03-30: 17:00:00 via INTRAVENOUS

## 2012-03-30 MED ORDER — ROCURONIUM BROMIDE 50 MG/5ML IV SOLN
INTRAVENOUS | Status: AC
  Filled 2012-03-30: qty 1

## 2012-03-30 MED ORDER — HYDROMORPHONE 0.3 MG/ML IV SOLN
INTRAVENOUS | Status: DC
Start: 2012-03-31 — End: 2012-04-05
  Administered 2012-03-30 – 2012-03-31 (×2): via INTRAVENOUS
  Administered 2012-03-31: 4.32 mg via INTRAVENOUS
  Administered 2012-03-31 (×2): 4.8 mg via INTRAVENOUS
  Administered 2012-03-31 (×2): via INTRAVENOUS
  Administered 2012-04-01: 2.95 mg via INTRAVENOUS
  Administered 2012-04-01: 1.6 mg via INTRAVENOUS
  Administered 2012-04-01: 3.2 mg via INTRAVENOUS
  Administered 2012-04-01: 0.3 mg via INTRAVENOUS
  Administered 2012-04-02: 2.7 mg via INTRAVENOUS
  Administered 2012-04-02: 4.13 mg via INTRAVENOUS
  Administered 2012-04-02: 11:00:00 via INTRAVENOUS
  Administered 2012-04-02: 25 mL via INTRAVENOUS
  Administered 2012-04-02: 1.79 mg via INTRAVENOUS
  Administered 2012-04-02: 2.9 mg via INTRAVENOUS
  Administered 2012-04-02: 22:00:00 via INTRAVENOUS
  Administered 2012-04-03: 1.8 mg via INTRAVENOUS
  Administered 2012-04-03: 4.8 mg via INTRAVENOUS
  Administered 2012-04-03: 10 mL via INTRAVENOUS
  Administered 2012-04-03: 13 mL via INTRAVENOUS
  Administered 2012-04-04: 06:00:00 via INTRAVENOUS
  Administered 2012-04-04: 1.2 mg via INTRAVENOUS
  Administered 2012-04-04: 4 mL via INTRAVENOUS
  Administered 2012-04-04: 19.8 mL via INTRAVENOUS
  Administered 2012-04-04: 1.5 mg via INTRAVENOUS
  Administered 2012-04-04: 6 mL via INTRAVENOUS
  Administered 2012-04-05: 05:00:00 via INTRAVENOUS
  Administered 2012-04-05: 0.9 mg via INTRAVENOUS
  Administered 2012-04-05: 4 mg via INTRAVENOUS
  Filled 2012-03-30 (×13): qty 25

## 2012-03-30 MED ORDER — POVIDONE-IODINE 10 % EX OINT
TOPICAL_OINTMENT | CUTANEOUS | Status: DC | PRN
  Administered 2012-03-30: 1 via TOPICAL

## 2012-03-30 MED ORDER — DIPHENHYDRAMINE HCL 50 MG/ML IJ SOLN: 12.5000 mg | Freq: Four times a day (QID) | INTRAMUSCULAR | Status: DC | PRN

## 2012-03-30 MED ORDER — ALBUTEROL SULFATE (5 MG/ML) 0.5% IN NEBU: 2.5000 mg | INHALATION_SOLUTION | RESPIRATORY_TRACT | Status: DC | PRN

## 2012-03-30 MED ORDER — MIDAZOLAM HCL 5 MG/5ML IJ SOLN
INTRAMUSCULAR | Status: DC | PRN
  Administered 2012-03-30: 2 mg via INTRAVENOUS

## 2012-03-30 MED ORDER — NEOSTIGMINE METHYLSULFATE 1 MG/ML IJ SOLN
INTRAMUSCULAR | Status: DC | PRN
  Administered 2012-03-30: 3 mg via INTRAVENOUS

## 2012-03-30 MED ORDER — SODIUM CHLORIDE 0.9 % IV SOLN
Freq: Once | INTRAVENOUS | Status: AC
  Administered 2012-03-30: 11:00:00 via INTRAVENOUS

## 2012-03-30 MED ORDER — NEOSTIGMINE METHYLSULFATE 1 MG/ML IJ SOLN
INTRAMUSCULAR | Status: AC
  Filled 2012-03-30: qty 10

## 2012-03-30 MED ORDER — SODIUM CHLORIDE 0.9 % IR SOLN
Status: DC | PRN
  Administered 2012-03-30: 4000 mL

## 2012-03-30 MED ORDER — SODIUM CHLORIDE 0.9 % IJ SOLN
3.0000 mL | Freq: Two times a day (BID) | INTRAMUSCULAR | Status: DC
  Administered 2012-03-31 – 2012-04-06 (×10): 3 mL via INTRAVENOUS
  Filled 2012-03-30 (×6): qty 3

## 2012-03-30 MED ORDER — NALOXONE HCL 0.4 MG/ML IJ SOLN: 0.4000 mg | INTRAMUSCULAR | Status: DC | PRN

## 2012-03-30 MED ORDER — GLYCOPYRROLATE 0.2 MG/ML IJ SOLN
INTRAMUSCULAR | Status: AC
  Filled 2012-03-30: qty 2

## 2012-03-30 MED ORDER — LIDOCAINE HCL (PF) 1 % IJ SOLN
INTRAMUSCULAR | Status: AC
  Filled 2012-03-30: qty 5

## 2012-03-30 MED ORDER — ONDANSETRON HCL 4 MG/2ML IJ SOLN
4.0000 mg | Freq: Four times a day (QID) | INTRAMUSCULAR | Status: DC | PRN
  Administered 2012-04-02 – 2012-04-06 (×2): 4 mg via INTRAVENOUS
  Filled 2012-03-30 (×2): qty 2

## 2012-03-30 MED ORDER — SODIUM CHLORIDE 0.9 % IV SOLN
INTRAVENOUS | Status: AC
  Filled 2012-03-30: qty 1

## 2012-03-30 MED ORDER — PROPOFOL 10 MG/ML IV EMUL
INTRAVENOUS | Status: AC
  Filled 2012-03-30: qty 20

## 2012-03-30 MED ORDER — SODIUM CHLORIDE 0.9 % IJ SOLN: 9.0000 mL | INTRAMUSCULAR | Status: DC | PRN

## 2012-03-30 MED ORDER — HYDROMORPHONE HCL PF 1 MG/ML IJ SOLN
1.0000 mg | Freq: Once | INTRAMUSCULAR | Status: AC
  Administered 2012-03-30: 1 mg via INTRAVENOUS
  Filled 2012-03-30: qty 1

## 2012-03-30 MED ORDER — HYDROMORPHONE 0.3 MG/ML IV SOLN
INTRAVENOUS | Status: AC
  Filled 2012-03-30: qty 25

## 2012-03-30 MED ORDER — PROPOFOL 10 MG/ML IV BOLUS
INTRAVENOUS | Status: DC | PRN
  Administered 2012-03-30: 160 mg via INTRAVENOUS

## 2012-03-30 MED ORDER — POVIDONE-IODINE 10 % EX OINT
TOPICAL_OINTMENT | CUTANEOUS | Status: AC
  Filled 2012-03-30: qty 3

## 2012-03-30 MED ORDER — DIPHENHYDRAMINE HCL 12.5 MG/5ML PO ELIX: 12.5000 mg | ORAL_SOLUTION | Freq: Four times a day (QID) | ORAL | Status: DC | PRN

## 2012-03-30 MED ORDER — LIDOCAINE HCL 1 % IJ SOLN
INTRAMUSCULAR | Status: DC | PRN
  Administered 2012-03-30: 25 mg via INTRADERMAL

## 2012-03-30 MED ORDER — LACTATED RINGERS IV SOLN
INTRAVENOUS | Status: DC | PRN
  Administered 2012-03-30: 19:00:00 via INTRAVENOUS

## 2012-03-30 MED ORDER — FENTANYL CITRATE 0.05 MG/ML IJ SOLN
INTRAMUSCULAR | Status: DC | PRN
  Administered 2012-03-30: 100 ug via INTRAVENOUS
  Administered 2012-03-30 (×2): 50 ug via INTRAVENOUS
  Administered 2012-03-30: 100 ug via INTRAVENOUS
  Administered 2012-03-30 (×4): 50 ug via INTRAVENOUS

## 2012-03-30 MED ORDER — MIDAZOLAM HCL 2 MG/2ML IJ SOLN
INTRAMUSCULAR | Status: AC
  Filled 2012-03-30: qty 2

## 2012-03-30 MED ORDER — ONDANSETRON HCL 4 MG/2ML IJ SOLN
INTRAMUSCULAR | Status: AC
  Filled 2012-03-30: qty 2

## 2012-03-30 MED ORDER — ONDANSETRON HCL 4 MG/2ML IJ SOLN: 4.0000 mg | Freq: Three times a day (TID) | INTRAMUSCULAR | Status: DC | PRN

## 2012-03-30 MED ORDER — ONDANSETRON HCL 4 MG/2ML IJ SOLN
INTRAMUSCULAR | Status: DC | PRN
  Administered 2012-03-30: 4 mg via INTRAVENOUS

## 2012-03-30 MED ORDER — ONDANSETRON HCL 4 MG PO TABS: 4.0000 mg | ORAL_TABLET | Freq: Four times a day (QID) | ORAL | Status: DC | PRN

## 2012-03-30 MED ORDER — LACTATED RINGERS IV SOLN
INTRAVENOUS | Status: DC
Start: 2012-03-30 — End: 2012-04-06
  Administered 2012-03-31 – 2012-04-01 (×2): 100 mL/h via INTRAVENOUS
  Administered 2012-04-01 – 2012-04-05 (×9): via INTRAVENOUS

## 2012-03-30 MED ORDER — IOHEXOL 300 MG/ML  SOLN
100.0000 mL | Freq: Once | INTRAMUSCULAR | Status: AC | PRN
  Administered 2012-03-30: 100 mL via INTRAVENOUS

## 2012-03-30 MED ORDER — SUCCINYLCHOLINE CHLORIDE 20 MG/ML IJ SOLN
INTRAMUSCULAR | Status: AC
  Filled 2012-03-30: qty 1

## 2012-03-30 MED ORDER — SODIUM CHLORIDE 0.9 % IV SOLN
1.0000 g | INTRAVENOUS | Status: DC | PRN
  Administered 2012-03-30: 1 g via INTRAVENOUS

## 2012-03-30 MED ORDER — ACETAMINOPHEN 650 MG RE SUPP: 650.0000 mg | Freq: Four times a day (QID) | RECTAL | Status: DC | PRN

## 2012-03-30 MED ORDER — PIPERACILLIN-TAZOBACTAM 3.375 G IVPB
3.3750 g | Freq: Three times a day (TID) | INTRAVENOUS | Status: DC
  Administered 2012-03-30: 3.375 g via INTRAVENOUS
  Filled 2012-03-30 (×3): qty 50

## 2012-03-30 MED ORDER — ONDANSETRON HCL 4 MG/2ML IJ SOLN
4.0000 mg | Freq: Once | INTRAMUSCULAR | Status: AC
  Administered 2012-03-30: 4 mg via INTRAVENOUS
  Filled 2012-03-30: qty 2

## 2012-03-30 MED ORDER — ENOXAPARIN SODIUM 40 MG/0.4ML ~~LOC~~ SOLN
40.0000 mg | Freq: Once | SUBCUTANEOUS | Status: AC
  Administered 2012-03-30: 40 mg via SUBCUTANEOUS
  Filled 2012-03-30: qty 0.4

## 2012-03-30 MED ORDER — GLYCOPYRROLATE 0.2 MG/ML IJ SOLN
INTRAMUSCULAR | Status: DC | PRN
  Administered 2012-03-30: 0.4 mg via INTRAVENOUS

## 2012-03-30 MED ORDER — ACETAMINOPHEN 325 MG PO TABS: 650.0000 mg | ORAL_TABLET | Freq: Four times a day (QID) | ORAL | Status: DC | PRN

## 2012-03-30 SURGICAL SUPPLY — 67 items
APPLIER CLIP 11 MED OPEN (CLIP)
APPLIER CLIP 13 LRG OPEN (CLIP)
BAG HAMPER (MISCELLANEOUS) ×3 IMPLANT
BARRIER SKIN 2 3/4 (OSTOMY) IMPLANT
BLADE CLIPPER SURG NEURO (BLADE) ×3 IMPLANT
CLAMP POUCH DRAINAGE QUIET (OSTOMY) IMPLANT
CLIP APPLIE 11 MED OPEN (CLIP) IMPLANT
CLIP APPLIE 13 LRG OPEN (CLIP) IMPLANT
CLOTH BEACON ORANGE TIMEOUT ST (SAFETY) ×3 IMPLANT
COVER LIGHT HANDLE STERIS (MISCELLANEOUS) ×6 IMPLANT
DRAIN PENROSE 12X.25 LTX STRL (MISCELLANEOUS) IMPLANT
DRAPE WARM FLUID 44X44 (DRAPE) ×6 IMPLANT
DURAPREP 26ML APPLICATOR (WOUND CARE) ×3 IMPLANT
ELECT BLADE 6 FLAT ULTRCLN (ELECTRODE) ×3 IMPLANT
ELECT REM PT RETURN 9FT ADLT (ELECTROSURGICAL) ×3
ELECTRODE REM PT RTRN 9FT ADLT (ELECTROSURGICAL) ×2 IMPLANT
GLOVE BIOGEL M 7.0 STRL (GLOVE) IMPLANT
GLOVE BIOGEL PI IND STRL 7.5 (GLOVE) ×4 IMPLANT
GLOVE BIOGEL PI INDICATOR 7.5 (GLOVE) ×2
GLOVE ECLIPSE 7.0 STRL STRAW (GLOVE) ×6 IMPLANT
GOWN STRL REIN XL XLG (GOWN DISPOSABLE) ×6 IMPLANT
HARMONIC SHEARS 14CM COAG (MISCELLANEOUS) IMPLANT
INST SET MAJOR GENERAL (KITS) ×3 IMPLANT
KIT ROOM TURNOVER APOR (KITS) ×3 IMPLANT
LIGASURE IMPACT 36 18CM CVD LR (INSTRUMENTS) IMPLANT
MANIFOLD NEPTUNE II (INSTRUMENTS) ×3 IMPLANT
NS IRRIG 1000ML POUR BTL (IV SOLUTION) ×12 IMPLANT
PACK ABDOMINAL MAJOR (CUSTOM PROCEDURE TRAY) ×3 IMPLANT
PAD ARMBOARD 7.5X6 YLW CONV (MISCELLANEOUS) ×3 IMPLANT
POUCH OSTOMY 2 3/4  H 3804 (WOUND CARE)
POUCH OSTOMY 2 PC DRNBL 2.75 (WOUND CARE) IMPLANT
RELOAD LINEAR CUT PROX 55 BLUE (ENDOMECHANICALS) ×12 IMPLANT
RELOAD PROXIMATE 75MM BLUE (ENDOMECHANICALS) IMPLANT
RETAINER VISCERA MED (MISCELLANEOUS) IMPLANT
RETRACTOR WND ALEXIS 25 LRG (MISCELLANEOUS) IMPLANT
RETRACTOR WOUND ALXS 34CM XLRG (MISCELLANEOUS) IMPLANT
RTRCTR WOUND ALEXIS 25CM LRG (MISCELLANEOUS)
RTRCTR WOUND ALEXIS 34CM XLRG (MISCELLANEOUS)
SEALER TISSUE G2 CVD JAW 35 (ENDOMECHANICALS) ×2 IMPLANT
SEALER TISSUE G2 CVD JAW 45CM (ENDOMECHANICALS) ×1
SET BASIN LINEN APH (SET/KITS/TRAYS/PACK) ×3 IMPLANT
SHEARS HARMONIC 23CM COAG (MISCELLANEOUS) IMPLANT
SOL PREP PROV IODINE SCRUB 4OZ (MISCELLANEOUS) IMPLANT
SPONGE GAUZE 4X4 12PLY (GAUZE/BANDAGES/DRESSINGS) ×3 IMPLANT
SPONGE LAP 18X18 X RAY DECT (DISPOSABLE) ×3 IMPLANT
STAPLER GUN LINEAR PROX 60 (STAPLE) IMPLANT
STAPLER PROXIMATE 55 BLUE (STAPLE) ×3 IMPLANT
STAPLER PROXIMATE 75MM BLUE (STAPLE) IMPLANT
STAPLER VISISTAT 35W (STAPLE) ×3 IMPLANT
SUCTION POOLE TIP (SUCTIONS) ×3 IMPLANT
SUT CHROMIC 0 SH (SUTURE) IMPLANT
SUT CHROMIC 2 0 SH (SUTURE) IMPLANT
SUT ETHIBOND NAB MO 7 #0 18IN (SUTURE) IMPLANT
SUT MNCRL AB 4-0 PS2 18 (SUTURE) IMPLANT
SUT NOVA NAB GS-26 0 60 (SUTURE) IMPLANT
SUT PDS AB 0 CTX 60 (SUTURE) ×6 IMPLANT
SUT SILK 2 0 (SUTURE)
SUT SILK 2 0 REEL (SUTURE) IMPLANT
SUT SILK 2-0 18XBRD TIE 12 (SUTURE) IMPLANT
SUT SILK 3 0 SH CR/8 (SUTURE) ×6 IMPLANT
SUT VIC AB 3-0 SH 27 (SUTURE)
SUT VIC AB 3-0 SH 27X BRD (SUTURE) IMPLANT
SWAB COLLECTION DEVICE MRSA (MISCELLANEOUS) ×3 IMPLANT
SYR BULB IRRIGATION 50ML (SYRINGE) ×3 IMPLANT
TOWEL OR 17X26 4PK STRL BLUE (TOWEL DISPOSABLE) IMPLANT
TRAY FOLEY CATH 14FR (SET/KITS/TRAYS/PACK) ×3 IMPLANT
TUBE ANAEROBIC PORT A CUL  W/M (MISCELLANEOUS) ×3 IMPLANT

## 2012-03-30 NOTE — Progress Notes (Signed)
Ng tube to rt nares. Placement verified per Dr Leticia Penna in OR. Ng connected to LIWS and draining clear pink drainage.

## 2012-03-30 NOTE — Op Note (Signed)
Patient:  Jeffrey [name]  DOB:  [birth year]  MRN:  [id number]   Preop Diagnosis:  Perforated hollow viscus  Postop Diagnosis:  Perforated small bowel diverticulum  Procedure:  Exploratory laparotomy, small bowel resection, primary side-to-side anastomoses  Surgeon:  Dr. Tilford Pillar  Anes:  General endotracheal  Indications:  Patient is a 61 year old male presented to [facility] with right lower quadrant abdominal pain. This is been increasing over the day. He presents the emergency department and workup and evaluation was consistent for perforation. Risks benefits alternatives of exploratory laparotomy possible bowel resection possible ostomy were discussed with the patient and family. Their questions and concerns were addressed the patient was consented for the emergent procedure.  Procedure note:  She was taken to the operating room was placed in supine position on the OR table time the general anesthetic is a Optician, dispensing. Once patient was asleep he is in endotracheally intubated by the nurse anesthetist. At this point a Foley catheter is placed in standard sterile fashion. Patient's abdomen is prepped with DuraPrep solution and draped in standard fashion. A midline incision was created with a 10 blade scalpel with a left lateral keyhole defect around the umbilicus. Additional dissection down to subcuticular tissue including the anterior fascia is carried out using electrocautery. The fascia is grasped Coker clamps and lifted anteriorly. The peritoneum was grasped with a hemostat and entered into with electrocautery. At this point the incision is opened both superiorly and inferiorly. There is some purulent secretions within the abdomen. There is no large amount of gross contamination. There is no odor. There is no enteric debris. At this time I began following the bowel. This continued distally to the cecum. The latter portion of the terminal ileum was tethered retroperitoneally and I did  scored the peritoneum in order to reflect the terminal ileum, cecum and appendix medially. While there was some purulent fluid in this area, there is no evidence of bowel wall thickening or perforation. The appendix appeared normal. I did obtain some cultures at this time for both anaerobic and aerobic evaluation. I continue to mobilize the ascending colon scoring the peritoneal reflection along the white line of Toltz. The ascending colon easily mobilized medially. It appeared normal. I do not find any evidence of a perforation of the right lower quadrant and opted to run the bowel proximally. The small bowel was followed from the terminal ileum to the ligament of Treitz. During this I did identify a large proximal small bowel diverticulum. This is hyperemic with thickening around the area. There was tenderness material surrounding this consistent with a localized perforation. No enteric discharge is noted upon palpation of the bowel. Several other large diverticulum of the small bowel were identified both proximally and distally. At this point I turned my attention to the bowel resection.  A window was created in the small bowel mesentery both proximal and distal to the inflamed diverticulum. I did divide the bowel with a and a GIA 55 staple load x2. The mesentery was divided using the Enseal bipolar device. At this point the small bowel specimen was placed in the back table and sent as a permanent specimen to pathology. On further inspection of the possible segment of small bowel admitted several other large diverticulum. Due to the close proximity to the perforated diverticulum I did opt at this time to remove these diverticulum. A reload of the GIA 55 stapler was utilized to divide the proximal small bowel. Again the Enseal the device was utilized  to divide the mesentery. This portion of small bowel was also sent as a permanent specimen to pathology. Inspection of the remainder of the small intestine did not  elicit any large diverticulum at this time I opted to proceed with the anastomosis. The small bowel was brought to close proximation in a side-to-side fashion. A 3-0 silk suture was utilized as pexing suture. An enterotomy was created and both limbs of the small intestine. A reload of the GIA 55 stapler was utilized to create the stapled anastomosis. The remaining enterotomy was closed with interrupted 3-0 silk. The apex of the anastomosis was also reinforced with a 3-0 silk. The small bowel mesentery was closed using 3-0 silk. Inspection of the remainder of the abdomen demonstrate no evidence of any other abnormalities. The liver was palpated and was felt to be normal. The spleen was palpated and was normal. The remainder of the colon was without abnormalities. No masses were felt. The stomach was palpated and nasogastric tube should then placed by the nurse anesthetist was noted be in excellent position. It was secured by the nurse anesthetist at this point.  At this point I did termite attention to initiating closure. The abdominal cavity was irrigated with a copious amount of sterile saline. All 4 corners were irrigated with sterile saline. I did create an omental tongue places over the anastomosis and secured this with several of the silk sutures which had been left long to facilitate tethering the omentum. The remainder of the small intestine was returned to the abdomen. The remaining omentum was pulled back in its proper orientation over the small intestine. The fascia was reapproximated with an oh looped PDS suture. The initial suture was started superiorly was brought to the midportion of the incision. A second oh looped PDS was started inferiorly and brought to the first. These were secured to each other. The skin edges were reapproximated using skin staples. The skin was washed dried moist dry towel. Betadine ointment was placed over the incision. Sterile dressings were placed. The drapes removed. The  patient was allowed to come out of general anesthetic and was transferred to PACU in stable condition. At the conclusion of the procedure all instrument, sponge, needle counts are correct. Patient tolerated procedure extremely well.  Complications:  None  EBL:  Less than 100 ML's  Specimen:  Small bowel segments x2, anaerobic and aerobic cultures.

## 2012-03-30 NOTE — Progress Notes (Signed)
Awake. Wants to see family. Rates pain 2. Transferred to 326 in good condition.

## 2012-03-30 NOTE — ED Notes (Signed)
Pt reports that he started having some abd pain that began a few hours ago.  Pt reports the pain in the low-mid area of his abd.  Pt reports some nausea but denies any vomiting.
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
## 2012-03-30 NOTE — Transfer of Care (Signed)
Immediate Anesthesia Transfer of Care Note  Patient: Jeffrey [name]  Procedure(s) Performed: Procedure(s) (LRB): EXPLORATORY LAPAROTOMY (N/A) SMALL BOWEL RESECTION (N/A)  Patient Location: PACU  Anesthesia Type: General  Level of Consciousness: awake and patient cooperative  Airway & Oxygen Therapy: Patient Spontanous Breathing and Patient connected to face mask oxygen  Post-op Assessment: Report given to PACU RN, Post -op Vital signs reviewed and stable and Patient moving all extremities  Post vital signs: Reviewed and stable  Complications: No apparent anesthesia complications
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
## 2012-03-30 NOTE — Anesthesia Procedure Notes (Signed)
Procedure Name: Intubation Date/Time: 03/30/2012 8:07 PM Performed by: Despina Hidden Pre-anesthesia Checklist: Emergency Drugs available, Suction available, Patient identified and Patient being monitored Patient Re-evaluated:Patient Re-evaluated prior to inductionOxygen Delivery Method: Circle system utilized Preoxygenation: Pre-oxygenation with 100% oxygen Intubation Type: IV induction, Cricoid Pressure applied and Rapid sequence Ventilation: Mask ventilation without difficulty Laryngoscope Size: 3 and Mac Grade View: Grade I Tube type: Oral Tube size: 8.0 mm Number of attempts: 1 Airway Equipment and Method: Stylet Placement Confirmation: ETT inserted through vocal cords under direct vision,  breath sounds checked- equal and bilateral and positive ETCO2 Secured at: 22 cm Tube secured with: Tape Dental Injury: Teeth and Oropharynx as per pre-operative assessment

## 2012-03-30 NOTE — Progress Notes (Signed)
ANTIBIOTIC CONSULT NOTE - INITIAL  Pharmacy Consult for Zosyn Indication: intra-abdominal infection  No Known Allergies  Patient Measurements: Height: 5\' 8"  (172.7 cm) Weight: 201 lb 15.1 oz (91.6 kg) IBW/kg (Calculated) : 68.4   Vital Signs: Temp: 98.5 F (36.9 C) (06/11 1629) Temp src: Oral (06/11 1629) BP: 152/88 mmHg (06/11 1629) Pulse Rate: 99  (06/11 1629) Intake/Output from previous day:   Intake/Output from this shift:    Labs:  Basename 03/30/12 1031  WBC 16.7*  HGB 14.9  PLT 133*  LABCREA --  CREATININE 0.83   Estimated Creatinine Clearance: 102.7 ml/min (by C-G formula based on Cr of 0.83). No results found for this basename: VANCOTROUGH:2,VANCOPEAK:2,VANCORANDOM:2,GENTTROUGH:2,GENTPEAK:2,GENTRANDOM:2,TOBRATROUGH:2,TOBRAPEAK:2,TOBRARND:2,AMIKACINPEAK:2,AMIKACINTROU:2,AMIKACIN:2, in the last 72 hours   Microbiology: No results found for this or any previous visit (from the past 720 hour(s)).  Medical History: Past Medical History  Diagnosis Date  . Hypercholesteremia   . Cancer     skin cancer-chest and neck    Medications:  Scheduled:    . sodium chloride   Intravenous Once  . HYDROmorphone  1 mg Intravenous Once  . ondansetron  4 mg Intravenous Once  . piperacillin-tazobactam (ZOSYN)  IV  3.375 g Intravenous Q8H  . sodium chloride  3 mL Intravenous Q12H  . DISCONTD: sodium chloride   Intravenous STAT   Assessment: 61 yo M admitted with intra-abdominal infection from perforated bowel to start on Zosyn.    Goal of Therapy:  Eradicate infection.  Plan:  1) Zosyn 3.375gm IV Q8h to be infused over 4hrs. 2) Monitor renal function and cx data.  Elson Clan 03/30/2012,4:46 PM

## 2012-03-30 NOTE — Plan of Care (Signed)
Problem: Phase I Progression Outcomes Goal: Initial discharge plan identified Outcome: Adequate for Discharge Plan to return home with wife
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
## 2012-03-30 NOTE — ED Provider Notes (Signed)
History  This chart was scribed for Jeffrey Lennert, Jeffrey [name] by Bennett Scrape. This patient was seen in room [facility] and the patient's care was started at 10:16AM.  CSN: [id number]  Arrival date & time 03/30/12  0936   First Jeffrey [name] Initiated Contact with Patient 03/30/12 1016      Chief Complaint  Patient presents with  . Abdominal Pain  . Nausea    Patient is a 61 y.o. male presenting with abdominal pain. The history is provided by the patient. No language interpreter was used.  Abdominal Pain The primary symptoms of the illness include abdominal pain and nausea (resolved ). The primary symptoms of the illness do not include fatigue, vomiting or diarrhea. The current episode started 3 to 5 hours ago. The onset of the illness was sudden. The problem has been gradually worsening.  The abdominal pain is located in the RLQ. The abdominal pain radiates to the suprapubic region. The severity of the abdominal pain is 10/10. The abdominal pain is relieved by being still. The abdominal pain is exacerbated by movement.  Symptoms associated with the illness do not include hematuria, frequency or back pain. Significant associated medical issues do not include GERD, inflammatory bowel disease or diabetes.    Jeffrey [name] is a 61 y.o. male who presents to the Emergency Department complaining of 3.5 hour of sudden onset, gradually worsening, constant RLQ abdominal pain described as sharp that radiates in the suprapubic area. He states that he was nauseated but that this has resolved. He denies taking any OTC medications PTA to improve his symptoms. He denies prior episodes of similar symptoms. He denies fever, emesis and diarrhea as associated symptoms. He denies having any major abdominal surgeries. He has a h/o CA and hypercholesteremia. He denies smoking and alcohol use.  PCP is Dr. Dwana [name].    Past Medical History  Diagnosis Date  . Hypercholesteremia   . Cancer     skin cancer-chest and neck      Past Surgical History  Procedure Date  . Knee arthroscopy     with bone removal-right leg- 2 yrs ago  . Hernia repair     umbilical hernia repair-3-4 yrs ago  . Cystectomy     right thumb 2o yrs  . Mass excision 12/11/2011    Procedure: EXCISION MASS;  Surgeon: Dallas Schimke, DPM;  Location: [facility];  Service: Orthopedics;  Laterality: Left;  Excision of plantar fibroma  . Cystectomy     Family History  Problem Relation Age of Onset  . Anesthesia problems Neg Hx   . Hypotension Neg Hx   . Malignant hyperthermia Neg Hx   . Pseudochol deficiency Neg Hx     History  Substance Use Topics  . Smoking status: Never Smoker   . Smokeless tobacco: Not on file  . Alcohol Use: No      Review of Systems  Constitutional: Negative for fatigue.  HENT: Negative for congestion, sinus pressure and ear discharge.   Eyes: Negative for discharge.  Respiratory: Negative for cough.   Cardiovascular: Negative for chest pain.  Gastrointestinal: Positive for nausea (resolved ) and abdominal pain. Negative for vomiting and diarrhea.  Genitourinary: Negative for frequency and hematuria.  Musculoskeletal: Negative for back pain.  Skin: Negative for rash.  Neurological: Negative for seizures and headaches.  Hematological: Negative.   Psychiatric/Behavioral: Negative for hallucinations.    Allergies  Review of patient's allergies indicates no known allergies.  Home Medications   Current Outpatient Rx  Name Route Sig Dispense Refill  . ASPIRIN EC 81 MG PO TBEC Oral Take 81 mg by mouth daily.    Marland Kitchen FLUTICASONE PROPIONATE 50 MCG/ACT NA SUSP Nasal Place 1 spray into the nose daily as needed. Allergies    . OLOPATADINE HCL 0.2 % OP SOLN Both Eyes Place 1 drop into both eyes daily.    Marland Kitchen ROSUVASTATIN CALCIUM 10 MG PO TABS Oral Take 10 mg by mouth daily.      Triage Vitals: BP 146/106  Pulse 106  Temp(Src) 99 F (37.2 C) (Oral)  Resp 18  Ht 5\' 8"  (1.727 m)  Wt 200 lb (90.719 kg)   BMI 30.41 kg/m2  SpO2 99%  Physical Exam  Nursing note and vitals reviewed. Constitutional: He is oriented to person, place, and time. He appears well-developed and well-nourished. No distress.  HENT:  Head: Normocephalic and atraumatic.  Eyes: Conjunctivae and EOM are normal. No scleral icterus.  Neck: Neck supple. No thyromegaly present.  Cardiovascular: Normal rate and regular rhythm.  Exam reveals no gallop and no friction rub.   No murmur heard. Pulmonary/Chest: Effort normal and breath sounds normal. No stridor. He has no wheezes. He has no rales. He exhibits no tenderness.  Abdominal: He exhibits no distension. There is tenderness (Mild tenderness suprapubic). There is rebound (Rebound tenderness in the RLQ).  Musculoskeletal: Normal range of motion. He exhibits no edema.  Lymphadenopathy:    He has no cervical adenopathy.  Neurological: He is alert and oriented to person, place, and time. Coordination normal.  Skin: Skin is warm and dry. No rash noted. No erythema.  Psychiatric: He has a normal mood and affect. His behavior is normal.    ED Course  Procedures (including critical care time)  DIAGNOSTIC STUDIES: Oxygen Saturation is 99% on room air, normal by my interpretation.    COORDINATION OF CARE: 10:23AM-Discussed treatment plan which includes blood work and CT scan with pt and pt agreed to plan. Pt states that the last time that he ate was around 7:30AM this morning.  11:37AM-Pt rechecked and says that his pain is improved. Informed pt of normal x-ray but advised him that I am still waiting on CT to be available.   Labs Reviewed  CBC - Abnormal; Notable for the following:    WBC 16.7 (*)    Platelets 133 (*)    All other components within normal limits  DIFFERENTIAL - Abnormal; Notable for the following:    Neutrophils Relative 87 (*)    Neutro Abs 14.4 (*)    Lymphocytes Relative 10 (*)    All other components within normal limits  COMPREHENSIVE METABOLIC PANEL  - Abnormal; Notable for the following:    Glucose, Bld 129 (*)    All other components within normal limits  LIPASE, BLOOD  URINALYSIS, ROUTINE W REFLEX MICROSCOPIC   Ct Abdomen Pelvis W Contrast  03/30/2012  *RADIOLOGY REPORT*  Clinical Data: Right lower quadrant pelvic and abdominal pain for 3- 5 hours  CT ABDOMEN AND PELVIS WITH CONTRAST  Technique:  Multidetector CT imaging of the abdomen and pelvis was performed following the standard protocol during bolus administration of intravenous contrast.  Contrast: OMNIPAQUE IOHEXOL 300 MG/ML  SOLN  Comparison: CT 10/27/2006  Findings: The lung bases are clear.  Liver, gallbladder, adrenal glands, kidneys, spleen, and pancreas are normal.  Bilateral parapelvic renal cysts are incidentally noted.  No hydronephrosis. Allowing for presence of contrast within the nondilated collecting systems, no radiopaque renal or ureteral calculus  is identified.  No bowel wall thickening or focal segmental dilatation.  The appendix is normal.  There is trace nonspecific fluid adjacent to a nondilated loop of small bowel on the right lower quadrant image 75.  No pelvic lymphadenopathy.  No acute osseous abnormality.  IMPRESSION: Trace nonspecific fluid adjacent to nondilated, non thickened loop of small bowel in the right lower quadrant.  No free air. Generally free fluid would be considered abnormal in a male patient but there is no other abnormality to explain its presence on the current exam.  This could be a subtle sign of inflammatory bowel disease, infection/inflammation, or less likely ischemia.  Original Report Authenticated By: Harrel Lemon, M.D.   Dg Abd Acute W/chest  03/30/2012  *RADIOLOGY REPORT*  Clinical Data: Abdominal pain.  ACUTE ABDOMEN SERIES (ABDOMEN 2 VIEW & CHEST 1 VIEW)  Comparison: Chest radiograph 07/17/2008  Findings: Heart, mediastinal, and hilar contours are within normal limits and stable.  The lung volumes are low.  No focal airspace  opacities or pleural effusions.  Negative for pneumothorax.  No evidence of free intraperitoneal air.  The bowel gas pattern is nonobstructive.  No significant stool burden identified.  Calcified phleboliths in the right pelvis.  No urinary tract stones are identified.  No acute bony abnormality.  IMPRESSION:  1.  Low lung volumes.  No acute cardiopulmonary disease. 2.  Nonobstructive bowel gas pattern.  Original Report Authenticated By: Britta Mccreedy, M.D.     No diagnosis found.    MDM    The chart was scribed for me under my direct supervision.  I personally performed the history, physical, and medical decision making and all procedures in the evaluation of this patient.Jeffrey Lennert, Jeffrey [name] 03/30/12 [phone]

## 2012-03-30 NOTE — Anesthesia Postprocedure Evaluation (Signed)
  Anesthesia Post-op Note  Patient: Jeffrey [name]  Procedure(s) Performed: Procedure(s) (LRB): EXPLORATORY LAPAROTOMY (N/A) SMALL BOWEL RESECTION (N/A)  Patient Location: PACU  Anesthesia Type: General  Level of Consciousness: awake, alert , oriented and patient cooperative  Airway and Oxygen Therapy: Patient Spontanous Breathing and Patient connected to nasal cannula oxygen  Post-op Pain: 5 /10, moderate  Post-op Assessment: Post-op Vital signs reviewed, Patient's Cardiovascular Status Stable, Respiratory Function Stable, Patent Airway, No signs of Nausea or vomiting and Pain level controlled  Post-op Vital Signs: Reviewed and stable  Complications: No apparent anesthesia complications

## 2012-03-31 LAB — CBC
Hemoglobin: 12.7 g/dL — ABNORMAL LOW (ref 13.0–17.0)
MCHC: 33.2 g/dL (ref 30.0–36.0)
RDW: 13.3 % (ref 11.5–15.5)
WBC: 14.3 10*3/uL — ABNORMAL HIGH (ref 4.0–10.5)

## 2012-03-31 LAB — BASIC METABOLIC PANEL
GFR calc Af Amer: >90 mL/min (ref >90)
GFR calc non Af Amer: 90 mL/min — ABNORMAL LOW (ref >90)
Glucose, Bld: 119 mg/dL — ABNORMAL HIGH (ref 70–99)
Potassium: 4.2 mEq/L (ref 3.5–5.1)
Sodium: 139 mEq/L (ref 135–145)

## 2012-03-31 LAB — GLUCOSE, CAPILLARY: Glucose-Capillary: 109 mg/dL — ABNORMAL HIGH (ref 70–99)

## 2012-03-31 MED ORDER — ROCURONIUM BROMIDE 50 MG/5ML IV SOLN
INTRAVENOUS | Status: AC
  Filled 2012-03-31: qty 1

## 2012-03-31 NOTE — Addendum Note (Signed)
Addendum  created 03/31/12 1406 by Despina Hidden, CRNA   Modules edited:Notes Section

## 2012-03-31 NOTE — Progress Notes (Signed)
UR chart review completed.  

## 2012-03-31 NOTE — Anesthesia Postprocedure Evaluation (Signed)
  Anesthesia Post-op Note  Patient: Jeffrey [name]  Procedure(s) Performed: Procedure(s) (LRB): EXPLORATORY LAPAROTOMY (N/A) SMALL BOWEL RESECTION (N/A)  Patient Location: room 326  Anesthesia Type: General  Level of Consciousness: awake and patient cooperative  Airway and Oxygen Therapy: Patient Spontanous Breathing and Patient connected to nasal cannula oxygen  Post-op Pain: 8 /10, severe  Post-op Assessment: Post-op Vital signs reviewed, Patient's Cardiovascular Status Stable, Respiratory Function Stable, Patent Airway, No signs of Nausea or vomiting and Pain level controlled  Post-op Vital Signs: Reviewed and stable  Complications: No apparent anesthesia complications

## 2012-03-31 NOTE — Care Management Note (Unsigned)
    Page 1 of 1   03/31/2012     11:18:40 AM   CARE MANAGEMENT NOTE 03/31/2012  Patient:  Jeffrey [name], Jeffrey [name]   Account Number:  [id number]  Date Initiated:  03/31/2012  Documentation initiated by:  Sharrie Rothman  Subjective/Objective Assessment:   Pt admitted from home with small bowel perforation. Pt lives at home with wife and is independent with ADL's. Pt will return home at discharge. Pt is s/p exp lap with small bowel resection.     Action/Plan:   No CM needs noted.   Anticipated DC Date:  04/04/2012   Anticipated DC Plan:  HOME/SELF CARE      DC Planning Services  CM consult      Choice offered to / List presented to:             Status of service:  Completed, signed off Medicare Important Message given?   (If response is "NO", the following Medicare IM given date fields will be blank) Date Medicare IM given:   Date Additional Medicare IM given:    Discharge Disposition:    Per UR Regulation:    If discussed at Long Length of Stay Meetings, dates discussed:    Comments:  03/31/12 1117 Arlyss Queen, RN BSN CM

## 2012-03-31 NOTE — Progress Notes (Signed)
1 Day Post-Op  Subjective: Pain present but controlled with PCA. No complaint of nausea. Throat is sore with a nasogastric tube. No fevers or chills. No flatus.  Objective: Vital signs in last 24 hours: Temp:  [97.8 F (36.6 C)-99.5 F (37.5 C)] 98.8 F (37.1 C) (06/12 0859) Pulse Rate:  [77-99] 79  (06/12 0859) Resp:  [15-24] 18  (06/12 0859) BP: (114-152)/(64-88) 116/74 mmHg (06/12 0859) SpO2:  [93 %-100 %] 97 % (06/12 0859) Weight:  [91.6 kg (201 lb 15.1 oz)] 91.6 kg (201 lb 15.1 oz) (06/11 1629) Last BM Date: 03/30/12  Intake/Output from previous day: 06/11 0701 - 06/12 0700 In: 1950 [I.V.:1900; IV Piggyback:50] Out: 430 [Urine:330; Blood:100] Intake/Output this shift:    General appearance: alert and no distress GI: Quiet, soft, expected moderate tenderness. Dressing is clean dry and intact. No peritoneal signs.  Lab Results:   [facility] 03/31/12 0614 03/30/12 1031  WBC 14.3* 16.7*  HGB 12.7* 14.9  HCT 38.2* 43.7  PLT 113* 133*   BMET  Basename 03/31/12 0614 03/30/12 1031  NA 139 143  K 4.2 4.1  CL 105 108  CO2 26 24  GLUCOSE 119* 129*  BUN 14 18  CREATININE 0.91 0.83  CALCIUM 8.9 10.4   PT/INR No results found for this basename: LABPROT:2,INR:2 in the last 72 hours ABG No results found for this basename: PHART:2,PCO2:2,PO2:2,HCO3:2 in the last 72 hours  Studies/Results: Ct Abdomen Pelvis W Contrast  03/30/2012  **ADDENDUM** CREATED: 03/30/2012 15:48:44  The study was reviewed after additional clinical information was provided at 3:50 p.m. on 03/30/2012.  There are several foci of mesenteric free air within the upper abdomen, for example image 24 series 2.  More inferiorly within the central mesentery, there is an abnormal possible collection or a blind ending tubular structure, for example image 61, appearing adjacent to small bowel. This raises the question of small bowel perforation, Meckel's diverticulum, or possibly enteric duplication with inflammation,  or non radiopaque foreign body.  Surgical consultation is recommended. Findings discussed with Dr. Kendell Bane by Dr. Tyron Russell and discussed with Dr. Tyron Russell by myself on 03/30/2012.  Addendum by Dr. Christiana Pellant on 03/30/2012 at 3:50 p.m.  **END ADDENDUM** SIGNED BY: Harrel Lemon, M.D.   03/30/2012  *RADIOLOGY REPORT*  Clinical Data: Right lower quadrant pelvic and abdominal pain for 3- 5 hours  CT ABDOMEN AND PELVIS WITH CONTRAST  Technique:  Multidetector CT imaging of the abdomen and pelvis was performed following the standard protocol during bolus administration of intravenous contrast.  Contrast: OMNIPAQUE IOHEXOL 300 MG/ML  SOLN  Comparison: CT 10/27/2006  Findings: The lung bases are clear.  Liver, gallbladder, adrenal glands, kidneys, spleen, and pancreas are normal.  Bilateral parapelvic renal cysts are incidentally noted.  No hydronephrosis. Allowing for presence of contrast within the nondilated collecting systems, no radiopaque renal or ureteral calculus is identified.  No bowel wall thickening or focal segmental dilatation.  The appendix is normal.  There is trace nonspecific fluid adjacent to a nondilated loop of small bowel on the right lower quadrant image 75.  No pelvic lymphadenopathy.  No acute osseous abnormality.  IMPRESSION: Trace nonspecific fluid adjacent to nondilated, non thickened loop of small bowel in the right lower quadrant.  No free air. Generally free fluid would be considered abnormal in a male patient but there is no other abnormality to explain its presence on the current exam.  This could be a subtle sign of inflammatory bowel disease, infection/inflammation, or less likely  ischemia.  Original Report Authenticated By: Harrel Lemon, M.D.   Dg Abd Acute W/chest  03/30/2012  *RADIOLOGY REPORT*  Clinical Data: Abdominal pain.  ACUTE ABDOMEN SERIES (ABDOMEN 2 VIEW & CHEST 1 VIEW)  Comparison: Chest radiograph 07/17/2008  Findings: Heart, mediastinal, and hilar contours  are within normal limits and stable.  The lung volumes are low.  No focal airspace opacities or pleural effusions.  Negative for pneumothorax.  No evidence of free intraperitoneal air.  The bowel gas pattern is nonobstructive.  No significant stool burden identified.  Calcified phleboliths in the right pelvis.  No urinary tract stones are identified.  No acute bony abnormality.  IMPRESSION:  1.  Low lung volumes.  No acute cardiopulmonary disease. 2.  Nonobstructive bowel gas pattern.  Original Report Authenticated By: Britta Mccreedy, M.D.    Anti-infectives: Anti-infectives     Start     Dose/Rate Route Frequency Ordered Stop   03/30/12 1839   ertapenem (INVANZ) 1 g in sodium chloride 0.9 % 50 mL IVPB  Status:  Discontinued        1 g 100 mL/hr over 30 Minutes Intravenous 60 min pre-op 03/30/12 1839 03/30/12 2311   03/30/12 1700   piperacillin-tazobactam (ZOSYN) IVPB 3.375 g  Status:  Discontinued        3.375 g 12.5 mL/hr over 240 Minutes Intravenous Every 8 hours 03/30/12 1645 03/30/12 2311          Assessment/Plan: s/p Procedure(s) (LRB): EXPLORATORY LAPAROTOMY (N/A) SMALL BOWEL RESECTION (N/A) Reassured patient. Continue nasogastric tube for now. We'll continue the Foley catheter until tomorrow per request. We'll increase patient's activity. Continue to await for bowel function.  LOS: 1 day    Hilman Kissling C 03/31/2012

## 2012-04-01 ENCOUNTER — Encounter (HOSPITAL_COMMUNITY): Payer: Self-pay | Admitting: General Surgery

## 2012-04-01 LAB — BASIC METABOLIC PANEL
CO2: 28 mEq/L (ref 19–32)
Chloride: 102 mEq/L (ref 96–112)
GFR calc Af Amer: >90 mL/min (ref >90)
Sodium: 137 mEq/L (ref 135–145)

## 2012-04-01 LAB — CBC
Platelets: 106 10*3/uL — ABNORMAL LOW (ref 150–400)
RBC: 3.92 MIL/uL — ABNORMAL LOW (ref 4.22–5.81)
RDW: 13.3 % (ref 11.5–15.5)
WBC: 9.6 10*3/uL (ref 4.0–10.5)

## 2012-04-01 MED ORDER — SODIUM CHLORIDE 0.9 % IJ SOLN
INTRAMUSCULAR | Status: AC
  Filled 2012-04-01: qty 3

## 2012-04-01 NOTE — Progress Notes (Signed)
2 Days Post-Op  Subjective: No flatus, no nausea.  No fever or chills.  Pain slightly better.  Objective: Vital signs in last 24 hours: Temp:  [99.4 F (37.4 C)-100.2 F (37.9 C)] 100.2 F (37.9 C) (06/13 2138) Pulse Rate:  [78-98] 98  (06/13 2138) Resp:  [13-20] 16  (06/13 2138) BP: (124-135)/(76-86) 133/86 mmHg (06/13 2138) SpO2:  [95 %-99 %] 97 % (06/13 2138) Last BM Date: 03/30/12  Intake/Output from previous day: 06/12 0701 - 06/13 0700 In: 2339 [P.O.:4; I.V.:2335] Out: 1700 [Urine:1400; Emesis/NG output:300] Intake/Output this shift:    General appearance: alert and no distress Resp: clear to auscultation bilaterally Cardio: regular rate and rhythm GI: Quiet.  Soft, flat.  Expected tenderness.  No peritoneal signs.  Incision clean dry intact.  Lab Results:   Basename 04/01/12 0540 03/31/12 0614  WBC 9.6 14.3*  HGB 12.4* 12.7*  HCT 37.3* 38.2*  PLT 106* 113*   BMET  Basename 04/01/12 0540 03/31/12 0614  NA 137 139  K 4.0 4.2  CL 102 105  CO2 28 26  GLUCOSE 104* 119*  BUN 12 14  CREATININE 0.90 0.91  CALCIUM 9.0 8.9   PT/INR No results found for this basename: LABPROT:2,INR:2 in the last 72 hours ABG No results found for this basename: PHART:2,PCO2:2,PO2:2,HCO3:2 in the last 72 hours  Studies/Results: No results found.  Anti-infectives: Anti-infectives     Start     Dose/Rate Route Frequency Ordered Stop   03/30/12 1839   ertapenem (INVANZ) 1 g in sodium chloride 0.9 % 50 mL IVPB  Status:  Discontinued        1 g 100 mL/hr over 30 Minutes Intravenous 60 min pre-op 03/30/12 1839 03/30/12 2311   03/30/12 1700   piperacillin-tazobactam (ZOSYN) IVPB 3.375 g  Status:  Discontinued        3.375 g 12.5 mL/hr over 240 Minutes Intravenous Every 8 hours 03/30/12 1645 03/30/12 2311          Assessment/Plan: s/p Procedure(s) (LRB): EXPLORATORY LAPAROTOMY (N/A) SMALL BOWEL RESECTION (N/A) Await bowel function.  Will clamp NG.  If tolertes will d/c  NG.  Continue IV fluid hydration.  Increase activity.  LOS: 2 days    Evalena Fujii C 04/01/2012

## 2012-04-02 LAB — CBC
HCT: 35.9 % — ABNORMAL LOW (ref 39.0–52.0)
Hemoglobin: 12.1 g/dL — ABNORMAL LOW (ref 13.0–17.0)
RDW: 12.9 % (ref 11.5–15.5)
WBC: 7.1 10*3/uL (ref 4.0–10.5)

## 2012-04-02 LAB — BASIC METABOLIC PANEL
BUN: 13 mg/dL (ref 6–23)
Chloride: 102 mEq/L (ref 96–112)
GFR calc Af Amer: >90 mL/min (ref >90)
Potassium: 3.7 mEq/L (ref 3.5–5.1)
Sodium: 138 mEq/L (ref 135–145)

## 2012-04-02 NOTE — Progress Notes (Signed)
3 Days Post-Op  Subjective: No nausea. No flatus. Abdominal pain is controlled. No fevers or chills. Has ambulated some.  Objective: Vital signs in last 24 hours: Temp:  [98.4 F (36.9 C)-100.2 F (37.9 C)] 98.4 F (36.9 C) (06/14 1445) Pulse Rate:  [72-98] 79  (06/14 1445) Resp:  [13-20] 18  (06/14 1445) BP: (115-133)/(70-86) 115/70 mmHg (06/14 1445) SpO2:  [94 %-99 %] 98 % (06/14 1445) Last BM Date: 03/30/12  Intake/Output from previous day: 06/13 0701 - 06/14 0700 In: 2911.7 [P.O.:140; I.V.:2471.7; NG/GT:300] Out: 1000 [Urine:550; Emesis/NG output:450] Intake/Output this shift:    General appearance: alert and no distress GI: Quiet, soft, mildly distended, moderate expected tenderness. Incision is clean dry and intact. No peritoneal signs are elicited.  Lab Results:   Basename 04/02/12 0532 04/01/12 0540  WBC 7.1 9.6  HGB 12.1* 12.4*  HCT 35.9* 37.3*  PLT 110* 106*   BMET  Basename 04/02/12 0532 04/01/12 0540  NA 138 137  K 3.7 4.0  CL 102 102  CO2 27 28  GLUCOSE 88 104*  BUN 13 12  CREATININE 0.76 0.90  CALCIUM 9.2 9.0   PT/INR No results found for this basename: LABPROT:2,INR:2 in the last 72 hours ABG No results found for this basename: PHART:2,PCO2:2,PO2:2,HCO3:2 in the last 72 hours  Studies/Results: No results found.  Anti-infectives: Anti-infectives     Start     Dose/Rate Route Frequency Ordered Stop   03/30/12 1839   ertapenem (INVANZ) 1 g in sodium chloride 0.9 % 50 mL IVPB  Status:  Discontinued        1 g 100 mL/hr over 30 Minutes Intravenous 60 min pre-op 03/30/12 1839 03/30/12 2311   03/30/12 1700   piperacillin-tazobactam (ZOSYN) IVPB 3.375 g  Status:  Discontinued        3.375 g 12.5 mL/hr over 240 Minutes Intravenous Every 8 hours 03/30/12 1645 03/30/12 2311          Assessment/Plan: s/p Procedure(s) (LRB): EXPLORATORY LAPAROTOMY (N/A) SMALL BOWEL RESECTION (N/A) Continue to await for bowel function. Continue ambulation as  tolerated. Continue n.p.o. except for ice chips at this time. Continue IV fluid hydration. Continue pain control and DVT prophylaxis. Overall patient is doing well and progressing.  LOS: 3 days    Jeffrey [name] C 04/02/2012

## 2012-04-02 NOTE — Progress Notes (Signed)
Patient ambulated in hall with minimal assistance, ambulated 19ft, no voiced complaints, tolerated well, assisted patient back into to bed, family at bedside

## 2012-04-03 LAB — BASIC METABOLIC PANEL
BUN: 14 mg/dL (ref 6–23)
CO2: 26 mEq/L (ref 19–32)
Chloride: 100 mEq/L (ref 96–112)
GFR calc non Af Amer: >90 mL/min (ref >90)
Glucose, Bld: 87 mg/dL (ref 70–99)
Potassium: 3.7 mEq/L (ref 3.5–5.1)
Sodium: 137 mEq/L (ref 135–145)

## 2012-04-03 LAB — WOUND CULTURE

## 2012-04-03 LAB — CBC
HCT: 33.2 % — ABNORMAL LOW (ref 39.0–52.0)
Hemoglobin: 11.5 g/dL — ABNORMAL LOW (ref 13.0–17.0)
MCHC: 34.6 g/dL (ref 30.0–36.0)
RBC: 3.6 MIL/uL — ABNORMAL LOW (ref 4.22–5.81)

## 2012-04-03 MED ORDER — SODIUM CHLORIDE 0.9 % IJ SOLN
INTRAMUSCULAR | Status: AC
  Administered 2012-04-04: 3 mL via INTRAVENOUS
  Filled 2012-04-03: qty 3

## 2012-04-03 NOTE — Progress Notes (Signed)
4 Days Post-Op  Subjective: No nausea. Abdominal pain is better. No fevers or chills. Does feel" rumbling" in his abdomen.  Objective: Vital signs in last 24 hours: Temp:  [98 F (36.7 C)-99.8 F (37.7 C)] 98.1 F (36.7 C) (06/15 0552) Pulse Rate:  [72-86] 83  (06/15 0552) Resp:  [13-21] 15  (06/15 0552) BP: (115-146)/(70-83) 120/74 mmHg (06/15 0552) SpO2:  [95 %-98 %] 97 % (06/15 0552) Last BM Date: 03/30/12  Intake/Output from previous day: 06/14 0701 - 06/15 0700 In: 5701.9 [I.V.:5701.9] Out: 550 [Urine:550] Intake/Output this shift:    General appearance: alert and no distress GI: Positive bowel sounds, soft, moderate tenderness. Incision is clean dry and intact.  Lab Results:   [facility] 04/03/12 0605 04/02/12 0532  WBC 4.9 7.1  HGB 11.5* 12.1*  HCT 33.2* 35.9*  PLT 124* 110*   BMET  Basename 04/03/12 0605 04/02/12 0532  NA 137 138  K 3.7 3.7  CL 100 102  CO2 26 27  GLUCOSE 87 88  BUN 14 13  CREATININE 0.73 0.76  CALCIUM 9.0 9.2   PT/INR No results found for this basename: LABPROT:2,INR:2 in the last 72 hours ABG No results found for this basename: PHART:2,PCO2:2,PO2:2,HCO3:2 in the last 72 hours  Studies/Results: No results found.  Anti-infectives: Anti-infectives     Start     Dose/Rate Route Frequency Ordered Stop   03/30/12 1839   ertapenem (INVANZ) 1 g in sodium chloride 0.9 % 50 mL IVPB  Status:  Discontinued        1 g 100 mL/hr over 30 Minutes Intravenous 60 min pre-op 03/30/12 1839 03/30/12 2311   03/30/12 1700   piperacillin-tazobactam (ZOSYN) IVPB 3.375 g  Status:  Discontinued        3.375 g 12.5 mL/hr over 240 Minutes Intravenous Every 8 hours 03/30/12 1645 03/30/12 2311          Assessment/Plan: s/p Procedure(s) (LRB): EXPLORATORY LAPAROTOMY (N/A) SMALL BOWEL RESECTION (N/A) Overall improving. Still waiting for bowel function improvement. Continue granulation as tolerated. We'll start clear liquids. Patient is aware that  should he develop any nausea he should back off clear liquids.   LOS: 4 days    Kahlin Mark C 04/03/2012

## 2012-04-04 LAB — BASIC METABOLIC PANEL
BUN: 13 mg/dL (ref 6–23)
CO2: 26 mEq/L (ref 19–32)
Calcium: 9.4 mg/dL (ref 8.4–10.5)
Creatinine, Ser: 0.75 mg/dL (ref 0.50–1.35)
GFR calc non Af Amer: >90 mL/min (ref >90)
Glucose, Bld: 95 mg/dL (ref 70–99)

## 2012-04-04 LAB — CBC
HCT: 34.6 % — ABNORMAL LOW (ref 39.0–52.0)
Hemoglobin: 12 g/dL — ABNORMAL LOW (ref 13.0–17.0)
MCH: 31.7 pg (ref 26.0–34.0)
MCHC: 34.7 g/dL (ref 30.0–36.0)
MCV: 91.5 fL (ref 78.0–100.0)
RBC: 3.78 MIL/uL — ABNORMAL LOW (ref 4.22–5.81)

## 2012-04-04 LAB — ANAEROBIC CULTURE

## 2012-04-04 MED ORDER — MAGNESIUM HYDROXIDE 400 MG/5ML PO SUSP
30.0000 mL | Freq: Every day | ORAL | Status: AC
  Administered 2012-04-04 – 2012-04-05 (×2): 30 mL via ORAL
  Filled 2012-04-04 (×2): qty 30

## 2012-04-04 NOTE — Progress Notes (Signed)
5 Days Post-Op  Subjective: Pain improved. Passing flatus. No bowel movement. No nausea clear liquids. No fevers or chills.  Objective: Vital signs in last 24 hours: Temp:  [98.1 F (36.7 C)-98.4 F (36.9 C)] 98.3 F (36.8 C) (06/16 0935) Pulse Rate:  [68-88] 79  (06/16 0935) Resp:  [16-20] 20  (06/16 0935) BP: (124-138)/(75-80) 138/80 mmHg (06/16 0935) SpO2:  [95 %-100 %] 97 % (06/16 0935) Last BM Date: 03/30/12  Intake/Output from previous day: 06/15 0701 - 06/16 0700 In: 200 [P.O.:200] Out: -  Intake/Output this shift: Total I/O In: 240 [P.O.:240] Out: -   General appearance: alert and no distress GI: Positive bowel sounds, soft, moderate expected tenderness. Incision is clean dry and intact. Staples are present. No peritoneal signs.  Lab Results:   [facility] 04/04/12 0623 04/03/12 0605  WBC 4.4 4.9  HGB 12.0* 11.5*  HCT 34.6* 33.2*  PLT 149* 124*   BMET  Basename 04/04/12 0623 04/03/12 0605  NA 137 137  K 4.3 3.7  CL 101 100  CO2 26 26  GLUCOSE 95 87  BUN 13 14  CREATININE 0.75 0.73  CALCIUM 9.4 9.0   PT/INR No results found for this basename: LABPROT:2,INR:2 in the last 72 hours ABG No results found for this basename: PHART:2,PCO2:2,PO2:2,HCO3:2 in the last 72 hours  Studies/Results: No results found.  Anti-infectives: Anti-infectives     Start     Dose/Rate Route Frequency Ordered Stop   03/30/12 1839   ertapenem (INVANZ) 1 g in sodium chloride 0.9 % 50 mL IVPB  Status:  Discontinued        1 g 100 mL/hr over 30 Minutes Intravenous 60 min pre-op 03/30/12 1839 03/30/12 2311   03/30/12 1700   piperacillin-tazobactam (ZOSYN) IVPB 3.375 g  Status:  Discontinued        3.375 g 12.5 mL/hr over 240 Minutes Intravenous Every 8 hours 03/30/12 1645 03/30/12 2311          Assessment/Plan: s/p Procedure(s) (LRB): EXPLORATORY LAPAROTOMY (N/A) SMALL BOWEL RESECTION (N/A) Patient does have increasing signs of bowel function. At this time we'll enhance  the patient to full liquid diet. I did discuss with the patient is a diet once he has a bowel movement. Continue ambulation. Continue IV fluid and IV pain medication for now. Once patient has improved bowel function we'll switch him to oral pain medication. Hopeful discharge in the next 24-48 hours  LOS: 5 days    Sidney Silberman C 04/04/2012

## 2012-04-04 NOTE — Progress Notes (Signed)
Patient ambulated in hall, ambulated 362ft, tolerated well, no voiced complaints, family at bedside, incentive used at this time as well

## 2012-04-04 NOTE — Progress Notes (Signed)
Patient ambulated in hall with nurse tech, ambulated 200 ft, tolerated well, no voiced complaints, family at bedside

## 2012-04-05 LAB — CBC
MCH: 31.7 pg (ref 26.0–34.0)
MCHC: 35 g/dL (ref 30.0–36.0)
MCV: 90.6 fL (ref 78.0–100.0)
Platelets: 143 10*3/uL — ABNORMAL LOW (ref 150–400)
RBC: 3.63 MIL/uL — ABNORMAL LOW (ref 4.22–5.81)

## 2012-04-05 LAB — BASIC METABOLIC PANEL
CO2: 24 mEq/L (ref 19–32)
Calcium: 9.1 mg/dL (ref 8.4–10.5)
Creatinine, Ser: 0.7 mg/dL (ref 0.50–1.35)
GFR calc non Af Amer: >90 mL/min (ref >90)
Sodium: 139 mEq/L (ref 135–145)

## 2012-04-05 MED ORDER — HYDROCODONE-ACETAMINOPHEN 5-325 MG PO TABS
1.0000 | ORAL_TABLET | ORAL | Status: DC | PRN
  Administered 2012-04-05 – 2012-04-06 (×6): 2 via ORAL
  Filled 2012-04-05 (×6): qty 2

## 2012-04-05 MED ORDER — HYDROMORPHONE HCL PF 1 MG/ML IJ SOLN: 1.0000 mg | INTRAMUSCULAR | Status: DC | PRN

## 2012-04-05 MED ORDER — CELECOXIB 100 MG PO CAPS
200.0000 mg | ORAL_CAPSULE | Freq: Two times a day (BID) | ORAL | Status: DC
  Administered 2012-04-05 – 2012-04-06 (×3): 200 mg via ORAL
  Filled 2012-04-05 (×3): qty 2

## 2012-04-05 NOTE — Progress Notes (Signed)
UR Chart Review Completed  

## 2012-04-05 NOTE — Progress Notes (Signed)
6 Days Post-Op  Subjective: Comfortable. Pain control. No nausea. Tolerating full liquids. Patient did report a bowel movement this morning. Continues to have flatus. Patient has been ambulatory.  Objective: Vital signs in last 24 hours: Temp:  [97.9 F (36.6 C)-98.6 F (37 C)] 98.1 F (36.7 C) (06/17 1027) Pulse Rate:  [70-87] 72  (06/17 1027) Resp:  [17-20] 20  (06/17 1027) BP: (125-149)/(72-92) 149/92 mmHg (06/17 1027) SpO2:  [96 %-100 %] 99 % (06/17 1027) Last BM Date: 04/05/12  Intake/Output from previous day: 06/16 0701 - 06/17 0700 In: 744 [P.O.:600; I.V.:144] Out: 200 [Urine:200] Intake/Output this shift: Total I/O In: 120 [P.O.:120] Out: -   General appearance: alert and no distress GI: Positive bowel sounds, soft, moderate tenderness. Incision is clean dry and intact. No hernias. No peritoneal signs.  Lab Results:   [facility] 04/05/12 0518 04/04/12 0623  WBC 3.9* 4.4  HGB 11.5* 12.0*  HCT 32.9* 34.6*  PLT 143* 149*   BMET  Basename 04/05/12 0518 04/04/12 0623  NA 139 137  K 3.8 4.3  CL 104 101  CO2 24 26  GLUCOSE 104* 95  BUN 10 13  CREATININE 0.70 0.75  CALCIUM 9.1 9.4   PT/INR No results found for this basename: LABPROT:2,INR:2 in the last 72 hours ABG No results found for this basename: PHART:2,PCO2:2,PO2:2,HCO3:2 in the last 72 hours  Studies/Results: No results found.  Anti-infectives: Anti-infectives     Start     Dose/Rate Route Frequency Ordered Stop   03/30/12 1839   ertapenem (INVANZ) 1 g in sodium chloride 0.9 % 50 mL IVPB  Status:  Discontinued        1 g 100 mL/hr over 30 Minutes Intravenous 60 min pre-op 03/30/12 1839 03/30/12 2311   03/30/12 1700   piperacillin-tazobactam (ZOSYN) IVPB 3.375 g  Status:  Discontinued        3.375 g 12.5 mL/hr over 240 Minutes Intravenous Every 8 hours 03/30/12 1645 03/30/12 2311          Assessment/Plan: s/p Procedure(s) (LRB): EXPLORATORY LAPAROTOMY (N/A) SMALL BOWEL RESECTION  (N/A) Patient continues to demonstrate improvement. At this time Will advance patient to a regular diet. Patient will be started on oral pain medication. Breakthrough medication will be provided. Continue ambulation. If patient continues to progress hopeful discharge in the next 24-48 hours.  LOS: 6 days    Jettie Lazare C 04/05/2012

## 2012-04-05 NOTE — Progress Notes (Signed)
Pt up in hallways with family, pt tolerated well; discontinued PCA and IV fluids per MD order; will continue to assess pain frequently; family at bedside with patient

## 2012-04-06 ENCOUNTER — Encounter (HOSPITAL_COMMUNITY): Payer: Self-pay

## 2012-04-06 LAB — CBC
MCH: 31.6 pg (ref 26.0–34.0)
MCV: 91.1 fL (ref 78.0–100.0)
Platelets: 149 10*3/uL — ABNORMAL LOW (ref 150–400)
RBC: 3.61 MIL/uL — ABNORMAL LOW (ref 4.22–5.81)
RDW: 12.5 % (ref 11.5–15.5)
WBC: 3.4 10*3/uL — ABNORMAL LOW (ref 4.0–10.5)

## 2012-04-06 LAB — BASIC METABOLIC PANEL
Calcium: 9.4 mg/dL (ref 8.4–10.5)
Chloride: 107 mEq/L (ref 96–112)
Creatinine, Ser: 0.86 mg/dL (ref 0.50–1.35)
GFR calc Af Amer: >90 mL/min (ref >90)
Sodium: 143 mEq/L (ref 135–145)

## 2012-04-06 MED ORDER — HYDROCODONE-ACETAMINOPHEN 5-325 MG PO TABS: 1.0000 | ORAL_TABLET | ORAL | Status: AC | PRN

## 2012-04-06 NOTE — Progress Notes (Signed)
Discharge instructions and prescriptions given, verbalized understanding, out in stable condition via w/c with staff and family. 

## 2012-05-06 NOTE — Discharge Summary (Signed)
Physician Discharge Summary  Patient ID: Jeffrey [name] MRN: [id number] DOB/AGE: 61-Aug-1952 61 y.o.  Admit date: 03/30/2012 Discharge date: 04/06/2012  Admission Diagnoses:Abdominal pain  Discharge Diagnoses: Perforated small bowel diverticulum. Active Problems:  Abdominal pain  Perforated bowel  Leukocytosis  Hyperlipidemia  GERD (gastroesophageal reflux disease)   Discharged Condition: stable  Hospital Course: Patient presented to [facility] with increasing abdominal pain.  Was initially admitted to the Hospitalist service but was later found to have free air on an radiologic study.  Patient was taken to the OR and a perforated SB diverticulum was identified.  Patient underwent a small bowel resection.  Was transferred back to a med-surg floor and continued to have improvement.  Patient had slow return of bowel function but once increased, he was advanced on his diet.  Pt did well and was discharged to home.  Consults: None  Significant Diagnostic Studies: labs: daily labs and radiology: CT scan: Abdomen and pelvis.  Treatments: surgery: exploratory laparotomy with SB resection.  Discharge Exam: Blood pressure 115/75, pulse 65, temperature 98.1 F (36.7 C), temperature source Oral, resp. rate 18, height 5\' 8"  (1.727 m), weight 91.6 kg (201 lb 15.1 oz), SpO2 94.00%. General appearance: alert and no distress Resp: clear to auscultation bilaterally Cardio: regular rate and rhythm GI: +BS, soft, incision c/d/i.  Expected tenderness.  Disposition: 01-Home or Self Care  Discharge Orders    Future Orders Please Complete By Expires   Diet - low sodium heart healthy      Increase activity slowly      Discharge instructions      Comments:   Increase activity as tolerated. May place ice pack for comfort.  Alternate an anti-inflammatory such as ibuprofen (Motrin, Advil) 400-600mg  every 6 hours with the prescribed pain medication.   Do not take any additional acetaminophen as there is  Tylenol in the pain medication.   Driving Restrictions      Comments:   No driving while on pain medications.   Lifting restrictions      Comments:   No lifting over 20lbs for 4-5 weeks post-op.   Discharge wound care:      Comments:   Clean surgical sites with soap and water.  May shower the morning after surgery unless instructed by Dr. Leticia Penna otherwise.  No soaking for 2-3 weeks.    If adhesive strips are in place, they may be removed in 1-2 weeks while in the shower.   Call MD for:  persistant nausea and vomiting      Call MD for:  temperature >100.4      Call MD for:  severe uncontrolled pain      Call MD for:  redness, tenderness, or signs of infection (pain, swelling, redness, odor or green/yellow discharge around incision site)        Medication List  As of 05/06/2012 11:49 PM   STOP taking these medications         predniSONE 10 MG tablet         TAKE these medications         aspirin EC 81 MG tablet   Take 81 mg by mouth at bedtime.      cholecalciferol 1000 UNITS tablet   Commonly known as: VITAMIN D   Take 1,000 Units by mouth daily.      fish oil-omega-3 fatty acids 1000 MG capsule   Take 1 g by mouth daily.      fluticasone 50 MCG/ACT nasal spray   Commonly  known as: FLONASE   Place 1 spray into the nose daily as needed. Allergies      metaxalone 800 MG tablet   Commonly known as: SKELAXIN   Take 800 mg by mouth 3 (three) times daily as needed. For muscle pain      multivitamin with minerals Tabs   Take 1 tablet by mouth daily.      naproxen 500 MG tablet   Commonly known as: NAPROSYN   Take 500 mg by mouth 2 (two) times daily with a meal.      omeprazole-sodium bicarbonate 40-1100 MG per capsule   Commonly known as: ZEGERID   Take 1 capsule by mouth daily as needed.      rosuvastatin 10 MG tablet   Commonly known as: CRESTOR   Take 10 mg by mouth at bedtime.           Follow-up Information    Follow up with Nell Schrack C, MD in 2  weeks. (Your appointment is July 2 at 10:15am)    Contact information:   [address] [place] [address] [phone]          Signed: Fabio Bering 05/06/2012, 11:49 PM

## 2012-08-06 ENCOUNTER — Other Ambulatory Visit: Payer: Self-pay

## 2012-08-06 NOTE — Telephone Encounter (Signed)
Patient has not been seen since 02/2010. Needs OV prior to refill.

## 2012-08-11 NOTE — Telephone Encounter (Signed)
LMOM to call back

## 2012-08-13 NOTE — Telephone Encounter (Signed)
Mailed Pt letter out.

## 2012-08-23 ENCOUNTER — Other Ambulatory Visit: Payer: Self-pay

## 2012-08-23 MED ORDER — OMEPRAZOLE-SODIUM BICARBONATE 40-1100 MG PO CAPS: 1.0000 | ORAL_CAPSULE | Freq: Every day | ORAL | Status: DC | PRN

## 2012-08-23 NOTE — Progress Notes (Signed)
Per RMR- ok to send in Zegerid 40mg  #30 with 20yr refills to [facility] [place].

## 2012-09-29 ENCOUNTER — Encounter: Payer: Self-pay | Admitting: Internal Medicine

## 2012-09-29 ENCOUNTER — Other Ambulatory Visit: Payer: Self-pay

## 2012-09-29 ENCOUNTER — Other Ambulatory Visit: Payer: Self-pay | Admitting: Internal Medicine

## 2012-09-29 DIAGNOSIS — Z139 Encounter for screening, unspecified: Secondary | ICD-10-CM

## 2012-09-29 DIAGNOSIS — R1900 Intra-abdominal and pelvic swelling, mass and lump, unspecified site: Secondary | ICD-10-CM

## 2012-09-29 LAB — CREATININE, SERUM: Creat: 0.89 mg/dL (ref 0.50–1.35)

## 2012-09-29 NOTE — Progress Notes (Signed)
Patient ID: Jeffrey [name], male   DOB: [birth year], 61 y.o.   MRN: [id number] Patient underwent laparotomy for perforated jejunal diverticulum about 6 months. He has had an uneventful postoperative course. However, he has approached me outside the hospital setting; has been concerned about a bulge just inferior to the left of the umbilicus when he eats he senses a bulge and mild discomfort there. I have examined him. He does have an asymmetrical area and slight proturberance; Do not perceive an obvious hernia. This is been an ongoing complaint since surgery. Dr. Leticia Penna has released him  from postoperative care. He is a friend (as well as the patient )of mine t and I have seen him outside the hospital/office setting. He has been complaining about this persistently and it bothers him, particularly in light of the fact that he has been a lifelong body builder and this issue appears to be impeding his ability to get back into his workout routine. Therefore, to evaluate further, we'll proceed with abdominal/ pelvic CT with IV and oral contrast to get a good look at his abdominal wall, etc.

## 2012-09-29 NOTE — Progress Notes (Signed)
CT scheduled for Thurs Dec 12th at patient is aware

## 2012-09-29 NOTE — Progress Notes (Signed)
Creatinine order has been done and faxed to lab. Jeffrey [name] is scheduling CT for pt.

## 2012-09-30 ENCOUNTER — Ambulatory Visit (HOSPITAL_COMMUNITY)
Admission: RE | Admit: 2012-09-30 | Discharge: 2012-09-30 | Disposition: A | Discharge status: Home / Self Care | Payer: 59 | Source: Ambulatory Visit | Attending: Internal Medicine | Admitting: Internal Medicine

## 2012-09-30 DIAGNOSIS — R1900 Intra-abdominal and pelvic swelling, mass and lump, unspecified site: Secondary | ICD-10-CM

## 2012-09-30 DIAGNOSIS — K439 Ventral hernia without obstruction or gangrene: Secondary | ICD-10-CM | POA: Insufficient documentation

## 2012-09-30 MED ORDER — IOHEXOL 300 MG/ML  SOLN
100.0000 mL | Freq: Once | INTRAMUSCULAR | Status: AC | PRN
  Administered 2012-09-30: 100 mL via INTRAVENOUS

## 2012-10-01 ENCOUNTER — Telehealth (INDEPENDENT_AMBULATORY_CARE_PROVIDER_SITE_OTHER): Payer: Self-pay | Admitting: General Surgery

## 2012-10-01 NOTE — Telephone Encounter (Signed)
Called patient to advise of appointment set up to see Dr. Derrell Lolling on 10/04/12 at 10:00 due to cancellation. Patient advised to bring insurance and id information, in addition to office location. Patient agreed.

## 2012-10-04 ENCOUNTER — Ambulatory Visit (INDEPENDENT_AMBULATORY_CARE_PROVIDER_SITE_OTHER): Payer: 59 | Admitting: General Surgery

## 2012-10-04 ENCOUNTER — Encounter (INDEPENDENT_AMBULATORY_CARE_PROVIDER_SITE_OTHER): Payer: Self-pay | Admitting: General Surgery

## 2012-10-04 VITALS — BP 128/76 | HR 78 | Temp 97.9°F | Resp 16 | Ht 68.0 in | Wt 199.5 lb

## 2012-10-04 DIAGNOSIS — K432 Incisional hernia without obstruction or gangrene: Secondary | ICD-10-CM | POA: Insufficient documentation

## 2012-10-04 NOTE — Patient Instructions (Signed)
The the pain and bulge you feel in your abdominal wall is due to an incisional hernia. We have discussed the natural history of this hernia today.  You will be scheduled for laparoscopic repair of your ventral hernia with mesh, possible open repair in the near future.     Hernia A hernia occurs when an internal organ pushes out through a weak spot in the abdominal wall. Hernias most commonly occur in the groin and around the navel. Hernias often can be pushed back into place (reduced). Most hernias tend to get worse over time. Some abdominal hernias can get stuck in the opening (irreducible or incarcerated hernia) and cannot be reduced. An irreducible abdominal hernia which is tightly squeezed into the opening is at risk for impaired blood supply (strangulated hernia). A strangulated hernia is a medical emergency. Because of the risk for an irreducible or strangulated hernia, surgery may be recommended to repair a hernia. CAUSES   Heavy lifting.  Prolonged coughing.  Straining to have a bowel movement.  A cut (incision) made during an abdominal surgery. HOME CARE INSTRUCTIONS   Bed rest is not required. You may continue your normal activities.  Avoid lifting more than 10 pounds (4.5 kg) or straining.  Cough gently. If you are a smoker it is best to stop. Even the best hernia repair can break down with the continual strain of coughing. Even if you do not have your hernia repaired, a cough will continue to aggravate the problem.  Do not wear anything tight over your hernia. Do not try to keep it in with an outside bandage or truss. These can damage abdominal contents if they are trapped within the hernia sac.  Eat a normal diet.  Avoid constipation. Straining over long periods of time will increase hernia size and encourage breakdown of repairs. If you cannot do this with diet alone, stool softeners may be used. SEEK IMMEDIATE MEDICAL CARE IF:   You have a fever.  You develop  increasing abdominal pain.  You feel nauseous or vomit.  Your hernia is stuck outside the abdomen, looks discolored, feels hard, or is tender.  You have any changes in your bowel habits or in the hernia that are unusual for you.  You have increased pain or swelling around the hernia.  You cannot push the hernia back in place by applying gentle pressure while lying down. MAKE SURE YOU:   Understand these instructions.  Will watch your condition.  Will get help right away if you are not doing well or get worse. Document Released: 10/06/2005 Document Revised: 12/29/2011 Document Reviewed: 05/25/2008 [facility] Patient Information 2013 [place], [place].

## 2012-10-04 NOTE — Progress Notes (Addendum)
Patient ID: Jeffrey [name], male   DOB: 06/09/1951, 61 y.o.   MRN: [id number]  Chief Complaint  Patient presents with  . Hernia    Ventral    HPI Jeffrey [name] is a 61 y.o. male.  He is referred by Dr. Gaetana Michaelis in [place] for evaluation of a symptomatic ventral hernia. Dr. Dwana Melena is his primary care physician.HPI  This patient is generally healthy. 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 In June of 2013 he developed acute abdominal pain, radiographic evidence of a perforated viscus and underwent  laparotomy by Dr. Leticia Penna. 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 He found a couple of other diverticula nearby and did a small bowel resection. The patient was hospitalized for one week. Postop he did fairly well although he had drainage from his wound for about 2 months. He now has completely healed the wound. He now feels a bulge just above and to the left of the umbilicus. This causes some burning and stinging. No history of incarceration.  Recent CT scan shows a hernia in the abdominal wall in the mid abdomen. This appears to be just at and above the umbilicus.  He is here with his wife today to discuss options for management.  He is generally healthy. Dr. Zachery Dakins performed an umbilical hernia repair without mesh about 8 years ago. He has GERD symptoms and hyperlipidemia.  Past Medical History  Diagnosis Date  . Hypercholesteremia   . Cancer     skin cancer-chest and neck  . GERD (gastroesophageal reflux disease)   . Ventral hernia   . Abdominal distension   . Abdominal pain     Past Surgical History  Procedure Date  . Knee arthroscopy     with bone removal-right leg- 2 yrs ago  . Hernia repair     umbilical hernia repair-3-4 yrs ago  . Cystectomy     right thumb 2o yrs  . Mass excision 12/11/2011    Procedure: EXCISION MASS;  Surgeon: Dallas Schimke, DPM;  Location: [facility];  Service: Orthopedics;  Laterality: Left;   Excision of plantar fibroma  . Cystectomy   . Laparotomy 03/30/2012    Procedure: EXPLORATORY LAPAROTOMY;  Surgeon: Fabio Bering, MD;  Location: [facility];  Service: General;  Laterality: N/A;  . Bowel resection 03/30/2012    Procedure: SMALL BOWEL RESECTION;  Surgeon: Fabio Bering, MD;  Location: [facility];  Service: General;  Laterality: N/A;    Family History  Problem Relation Age of Onset  . Anesthesia problems Neg Hx   . Hypotension Neg Hx   . Malignant hyperthermia Neg Hx   . Pseudochol deficiency Neg Hx   . Cancer Mother     breast  . Stroke Mother     Social History History  Substance Use Topics  . Smoking status: Never Smoker   . Smokeless tobacco: Never Used  . Alcohol Use: No     Comment: a beer on occasion    No Known Allergies  Current Outpatient Prescriptions  Medication Sig Dispense Refill  . aspirin EC 81 MG tablet Take 81 mg by mouth at bedtime.       . fluticasone (FLONASE) 50 MCG/ACT nasal spray Place 1 spray into the nose daily as needed. Allergies      . Krill Oil Ultra Strength 1500 MG CAPS Take by mouth daily.      . Multiple Vitamin (MULTIVITAMIN WITH MINERALS) TABS Take 1 tablet by  mouth daily.      . rosuvastatin (CRESTOR) 10 MG tablet Take 10 mg by mouth at bedtime.         Review of Systems Review of Systems  Constitutional: Negative for fever, chills and unexpected weight change.  HENT: Negative for hearing loss, congestion, sore throat, trouble swallowing and voice change.   Eyes: Negative for visual disturbance.  Respiratory: Negative for cough and wheezing.   Cardiovascular: Negative for chest pain, palpitations and leg swelling.  Gastrointestinal: Positive for abdominal pain. Negative for nausea, vomiting, diarrhea, constipation, blood in stool, abdominal distention, anal bleeding and rectal pain.  Genitourinary: Negative for hematuria and difficulty urinating.  Musculoskeletal: Negative for arthralgias.  Skin: Negative for rash and  wound.  Neurological: Negative for seizures, syncope, weakness and headaches.  Hematological: Negative for adenopathy. Does not bruise/bleed easily.  Psychiatric/Behavioral: Negative for confusion.    Blood pressure 128/76, pulse 78, temperature 97.9 F (36.6 C), temperature source Temporal, resp. rate 16, height 5\' 8"  (1.727 m), weight 199 lb 8 oz (90.493 kg).  Physical Exam Physical Exam  Constitutional: He is oriented to person, place, and time. He appears well-developed and well-nourished. No distress.  HENT:  Head: Normocephalic.  Nose: Nose normal.  Mouth/Throat: No oropharyngeal exudate.  Eyes: Conjunctivae normal and EOM are normal. Pupils are equal, round, and reactive to light. Right eye exhibits no discharge. Left eye exhibits no discharge. No scleral icterus.  Neck: Normal range of motion. Neck supple. No JVD present. No tracheal deviation present. No thyromegaly present.  Cardiovascular: Normal rate, regular rhythm, normal heart sounds and intact distal pulses.   No murmur heard. Pulmonary/Chest: Effort normal and breath sounds normal. No stridor. No respiratory distress. He has no wheezes. He has no rales. He exhibits no tenderness.  Abdominal: Soft. Bowel sounds are normal. He exhibits no distension and no mass. There is no tenderness. There is no rebound and no guarding.         Midline scar. Transverse scar below umbilicus. Hernia bulge just above and to the left of the umbilicus. Defect probably 4 cm. Reducible. Don't feel any other hernias in the abdominal wall or in the inguinal areas.  Musculoskeletal: Normal range of motion. He exhibits no edema and no tenderness.  Lymphadenopathy:    He has no cervical adenopathy.  Neurological: He is alert and oriented to person, place, and time. He has normal reflexes. Coordination normal.  Skin: Skin is warm and dry. No rash noted. He is not diaphoretic. No erythema. No pallor.  Psychiatric: He has a normal mood and affect. His  behavior is normal. Judgment and thought content normal.    Data Reviewed Phone conversation with Dr. Benard Rink.  CT scan. Dr. Illene Regulus operative note.  Assessment    Ventral incisional hernia, periumbilical, symptomatic  History of laparotomy for perforated jejunal diverticulum with small bowel resection, June 2013  History of umbilical hernia repair, details unknown. No mesh according to patient    Plan    The patient would like to have this repaired sooner rather than later. He is aware that this is elective. He would like to go ahead before this gets any larger.  He'll be scheduled for laparoscopic repair of his ventral incisional hernia with mesh, possible open repair in the near future.  I discussed the indications, details, techniques, and numerous risks with the patient and his wife. He understands all of these issues. His questions were answered. He agrees with this plan.  Angelia Mould. Derrell Lolling, M.D., [facility] General and Minimally invasive Surgery Breast and Colorectal Surgery Office:   [phone] Pager:   [phone]  10/04/2012, 11:02 AM

## 2012-10-12 ENCOUNTER — Encounter (HOSPITAL_COMMUNITY): Payer: Self-pay | Admitting: Pharmacy Technician

## 2012-10-14 ENCOUNTER — Encounter (HOSPITAL_COMMUNITY)
Admission: RE | Admit: 2012-10-14 | Discharge: 2012-10-14 | Disposition: A | Discharge status: Home / Self Care | Payer: 59 | Source: Ambulatory Visit | Attending: General Surgery | Admitting: General Surgery

## 2012-10-14 ENCOUNTER — Encounter (HOSPITAL_COMMUNITY): Payer: Self-pay

## 2012-10-14 LAB — CBC
MCV: 91.1 fL (ref 78.0–100.0)
Platelets: 140 10*3/uL — ABNORMAL LOW (ref 150–400)
RDW: 12.8 % (ref 11.5–15.5)
WBC: 5.9 10*3/uL (ref 4.0–10.5)

## 2012-10-14 LAB — SURGICAL PCR SCREEN
MRSA, PCR: NEGATIVE
Staphylococcus aureus: NEGATIVE

## 2012-10-14 NOTE — Patient Instructions (Addendum)
20 Jeffrey [name]  10/14/2012   Your procedure is scheduled on:   10-22-2012  Report to [facility] at      1000  AM   Call this number if you have problems the morning of surgery: [phone]  Or Presurgical Testing [phone](Marcy Bogosian)   Remember: Follow any bowel prep instructions per MD office.    Do not eat food:After Midnight.  May have clear liquids:up to 6 Hours before arrival. Nothing after : 0600 AM  Clear liquids include soda, tea, black coffee, apple or grape juice, broth.  Take these medicines the morning of surgery with A SIP OF WATER: none   Do not wear jewelry, make-up or nail polish.  Do not wear lotions, powders, or perfumes. You may wear deodorant.  Do not shave 48 hours prior to surgery.(face and neck okay, no shaving of legs)  Do not bring valuables to the hospital.  Contacts, dentures or bridgework,body piercing,  may not be worn into surgery.  Leave suitcase in the car. After surgery it may be brought to your room.  For patients admitted to the hospital, checkout time is 11:00 AM the day of discharge.   Patients discharged the day of surgery will not be allowed to drive home. Must have responsible person with you x 24 hours once discharged.  Name and phone number of your driver:Jeffrey [name], spouse [phone] cell   Special Instructions: CHG Shower Use Special Wash: see special instructions.(avoid face and genitals)   Please read over the following fact sheets that you were given: MRSA Information, Blood Transfusion fact sheet, Incentive Spirometry Instruction.    Failure to follow these instructions may result in Cancellation of your surgery.   Patient signature_______________________________________________________

## 2012-10-19 NOTE — H&P (Signed)
Jeffrey [name]    MRN: [id number]   Description: 61 year old male  Provider: Ernestene Mention, MD  Department: [facility]       Diagnoses     Incisional hernia   - Primary    553.21       Vitals    BP Pulse Temp Resp Ht Wt    128/76 78 97.9 F (36.6 C) (Temporal) 16 5\' 8"  (1.727 m) 199 lb 8 oz (90.493 kg)     BMI - 30.33 kg/m2                 History and Physical   Ernestene Mention, MD   Patient ID: Jeffrey [name], male   DOB: Sep 30, 1951, 61 y.o.   MRN: [id number]               HPI [name] is a 61 y.o. male.  He is referred by Dr. Gaetana Michaelis in [place] for evaluation of a symptomatic ventral hernia. Dr. Dwana Melena is his primary care physician.HPI   This patient is generally healthy. 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 In June of 2013 he developed acute abdominal pain, radiographic evidence of a perforated viscus and underwent  laparotomy by Dr. Leticia Penna. 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 He found a couple of other diverticula nearby and did a small bowel resection. The patient was hospitalized for one week. Postop he did fairly well although he had drainage from his wound for about 2 months. He now has completely healed the wound. He now feels a bulge just above and to the left of the umbilicus. This causes some burning and stinging. No history of incarceration.   Recent CT scan shows a hernia in the abdominal wall in the mid abdomen. This appears to be just at and above the umbilicus.   He is here with his wife today to discuss options for management.   He is generally healthy. Dr. Zachery Dakins performed an umbilical hernia repair without mesh about 8 years ago. He has GERD symptoms and hyperlipidemia.        Past Medical History   Diagnosis  Date   .  Hypercholesteremia     .  Cancer         skin cancer-chest and neck   .  GERD (gastroesophageal reflux disease)     .  Ventral hernia     .   Abdominal distension     .  Abdominal pain         Past Surgical History   Procedure  Date   .  Knee arthroscopy         with bone removal-right leg- 2 yrs ago   .  Hernia repair         umbilical hernia repair-3-4 yrs ago   .  Cystectomy         right thumb 2o yrs   .  Mass excision  12/11/2011       Procedure: EXCISION MASS;  Surgeon: Dallas Schimke, DPM;  Location: [facility];  Service: Orthopedics;  Laterality: Left;  Excision of plantar fibroma   .  Cystectomy     .  Laparotomy  03/30/2012       Procedure: EXPLORATORY LAPAROTOMY;  Surgeon: Fabio Bering, MD;  Location: [facility];  Service: General;  Laterality: N/A;   .  Bowel resection  03/30/2012       Procedure:  SMALL BOWEL RESECTION;  Surgeon: Fabio Bering, MD;  Location: [facility];  Service: General;  Laterality: N/A;       Family History   Problem  Relation  Age of Onset   .  Anesthesia problems  Neg Hx     .  Hypotension  Neg Hx     .  Malignant hyperthermia  Neg Hx     .  Pseudochol deficiency  Neg Hx     .  Cancer  Mother         breast   .  Stroke  Mother        Social History History   Substance Use Topics   .  Smoking status:  Never Smoker    .  Smokeless tobacco:  Never Used   .  Alcohol Use:  No         Comment: a beer on occasion      No Known Allergies    Current Outpatient Prescriptions   Medication  Sig  Dispense  Refill   .  aspirin EC 81 MG tablet  Take 81 mg by mouth at bedtime.          .  fluticasone (FLONASE) 50 MCG/ACT nasal spray  Place 1 spray into the nose daily as needed. Allergies         .  Krill Oil Ultra Strength 1500 MG CAPS  Take by mouth daily.         .  Multiple Vitamin (MULTIVITAMIN WITH MINERALS) TABS  Take 1 tablet by mouth daily.         .  rosuvastatin (CRESTOR) 10 MG tablet  Take 10 mg by mouth at bedtime.             Review of Systems   Constitutional: Negative for fever, chills and unexpected weight change.  HENT: Negative for hearing loss, congestion, sore  throat, trouble swallowing and voice change.   Eyes: Negative for visual disturbance.  Respiratory: Negative for cough and wheezing.   Cardiovascular: Negative for chest pain, palpitations and leg swelling.  Gastrointestinal: Positive for abdominal pain. Negative for nausea, vomiting, diarrhea, constipation, blood in stool, abdominal distention, anal bleeding and rectal pain.  Genitourinary: Negative for hematuria and difficulty urinating.  Musculoskeletal: Negative for arthralgias.  Skin: Negative for rash and wound.  Neurological: Negative for seizures, syncope, weakness and headaches.  Hematological: Negative for adenopathy. Does not bruise/bleed easily.  Psychiatric/Behavioral: Negative for confusion.    Blood pressure 128/76, pulse 78, temperature 97.9 F (36.6 C), temperature source Temporal, resp. rate 16, height 5\' 8"  (1.727 m), weight 199 lb 8 oz (90.493 kg).   Physical Exam   Constitutional: He is oriented to person, place, and time. He appears well-developed and well-nourished. No distress.  HENT:   Head: Normocephalic.   Nose: Nose normal.   Mouth/Throat: No oropharyngeal exudate.  Eyes: Conjunctivae normal and EOM are normal. Pupils are equal, round, and reactive to light. Right eye exhibits no discharge. Left eye exhibits no discharge. No scleral icterus.  Neck: Normal range of motion. Neck supple. No JVD present. No tracheal deviation present. No thyromegaly present.  Cardiovascular: Normal rate, regular rhythm, normal heart sounds and intact distal pulses.    No murmur heard. Pulmonary/Chest: Effort normal and breath sounds normal. No stridor. No respiratory distress. He has no wheezes. He has no rales. He exhibits no tenderness.  Abdominal: Soft. Bowel sounds are normal. He exhibits no distension and no mass. There is  no tenderness. There is no rebound and no guarding.         Midline scar. Transverse scar below umbilicus. Hernia bulge just above and to the left of  the umbilicus. Defect probably 4 cm. Reducible. Don't feel any other hernias in the abdominal wall or in the inguinal areas.  Musculoskeletal: Normal range of motion. He exhibits no edema and no tenderness.  Lymphadenopathy:    He has no cervical adenopathy.  Neurological: He is alert and oriented to person, place, and time. He has normal reflexes. Coordination normal.  Skin: Skin is warm and dry. No rash noted. He is not diaphoretic. No erythema. No pallor.  Psychiatric: He has a normal mood and affect. His behavior is normal. Judgment and thought content normal.    Data Reviewed Phone conversation with Dr. Benard Rink.  CT scan. Dr. Illene Regulus operative note.   Assessment Ventral incisional hernia, periumbilical, symptomatic   History of laparotomy for perforated jejunal diverticulum with small bowel resection, June 2013   History of umbilical hernia repair, details unknown. No mesh according to patient   Plan The patient would like to have this repaired sooner rather than later. He is aware that this is elective. He would like to go ahead before this gets any larger.   He'll be scheduled for laparoscopic repair of his ventral incisional hernia with mesh, possible open repair in the near future.   I discussed the indications, details, techniques, and numerous risks with the patient and his wife. He understands all of these issues. His questions were answered. He agrees with this plan.       Angelia Mould. Derrell Lolling, M.D., [facility] General and Minimally invasive Surgery Breast and Colorectal Surgery Office:   [phone] Pager:   [phone]

## 2012-10-22 ENCOUNTER — Encounter (HOSPITAL_COMMUNITY): Payer: Self-pay | Admitting: Certified Registered Nurse Anesthetist

## 2012-10-22 ENCOUNTER — Ambulatory Visit (HOSPITAL_COMMUNITY): Payer: 59 | Admitting: Certified Registered Nurse Anesthetist

## 2012-10-22 ENCOUNTER — Encounter (HOSPITAL_COMMUNITY)
Admission: RE | Disposition: A | Discharge status: Home / Self Care | Payer: Self-pay | Source: Ambulatory Visit | Attending: General Surgery

## 2012-10-22 ENCOUNTER — Encounter (HOSPITAL_COMMUNITY): Payer: Self-pay | Admitting: *Deleted

## 2012-10-22 ENCOUNTER — Inpatient Hospital Stay (HOSPITAL_COMMUNITY)
Admission: RE | Admit: 2012-10-22 | Discharge: 2012-10-26 | DRG: 337 | Disposition: A | Discharge status: Home / Self Care | Payer: 59 | Source: Ambulatory Visit | Attending: General Surgery | Admitting: General Surgery

## 2012-10-22 ENCOUNTER — Encounter (HOSPITAL_COMMUNITY): Payer: Self-pay | Admitting: Surgery

## 2012-10-22 DIAGNOSIS — K660 Peritoneal adhesions (postprocedural) (postinfection): Secondary | ICD-10-CM | POA: Diagnosis present

## 2012-10-22 DIAGNOSIS — Z79899 Other long term (current) drug therapy: Secondary | ICD-10-CM

## 2012-10-22 DIAGNOSIS — K432 Incisional hernia without obstruction or gangrene: Secondary | ICD-10-CM | POA: Diagnosis present

## 2012-10-22 DIAGNOSIS — K430 Incisional hernia with obstruction, without gangrene: Principal | ICD-10-CM | POA: Diagnosis present

## 2012-10-22 DIAGNOSIS — E7800 Pure hypercholesterolemia, unspecified: Secondary | ICD-10-CM | POA: Diagnosis present

## 2012-10-22 DIAGNOSIS — Z7982 Long term (current) use of aspirin: Secondary | ICD-10-CM

## 2012-10-22 DIAGNOSIS — K219 Gastro-esophageal reflux disease without esophagitis: Secondary | ICD-10-CM | POA: Diagnosis present

## 2012-10-22 HISTORY — PX: INSERTION OF MESH: SHX5868

## 2012-10-22 HISTORY — PX: INCISIONAL HERNIA REPAIR: SHX193

## 2012-10-22 SURGERY — REPAIR, HERNIA, INCISIONAL, LAPAROSCOPIC
Anesthesia: General | Wound class: Clean

## 2012-10-22 MED ORDER — ATORVASTATIN CALCIUM 20 MG PO TABS
20.0000 mg | ORAL_TABLET | Freq: Every day | ORAL | Status: DC
  Administered 2012-10-22 – 2012-10-25 (×4): 20 mg via ORAL
  Filled 2012-10-22 (×5): qty 1

## 2012-10-22 MED ORDER — LACTATED RINGERS IV SOLN: INTRAVENOUS | Status: DC

## 2012-10-22 MED ORDER — HYDROMORPHONE HCL PF 1 MG/ML IJ SOLN
INTRAMUSCULAR | Status: DC | PRN
  Administered 2012-10-22 (×4): 0.5 mg via INTRAVENOUS

## 2012-10-22 MED ORDER — ONDANSETRON HCL 4 MG PO TABS: 4.0000 mg | ORAL_TABLET | Freq: Four times a day (QID) | ORAL | Status: DC | PRN

## 2012-10-22 MED ORDER — DOCUSATE SODIUM 100 MG PO CAPS
100.0000 mg | ORAL_CAPSULE | Freq: Every day | ORAL | Status: DC | PRN
  Filled 2012-10-22: qty 1

## 2012-10-22 MED ORDER — INFLUENZA VIRUS VACC SPLIT PF IM SUSP
0.5000 mL | INTRAMUSCULAR | Status: AC
  Administered 2012-10-23: 0.5 mL via INTRAMUSCULAR
  Filled 2012-10-22 (×2): qty 0.5

## 2012-10-22 MED ORDER — PHENYLEPHRINE HCL 10 MG/ML IJ SOLN
INTRAMUSCULAR | Status: DC | PRN
  Administered 2012-10-22: 40 ug via INTRAVENOUS
  Administered 2012-10-22: 80 ug via INTRAVENOUS

## 2012-10-22 MED ORDER — ONDANSETRON HCL 4 MG/2ML IJ SOLN
INTRAMUSCULAR | Status: DC | PRN
  Administered 2012-10-22: 4 mg via INTRAVENOUS

## 2012-10-22 MED ORDER — BUPIVACAINE-EPINEPHRINE (PF) 0.5% -1:200000 IJ SOLN
INTRAMUSCULAR | Status: AC
  Filled 2012-10-22: qty 10

## 2012-10-22 MED ORDER — MIDAZOLAM HCL 5 MG/5ML IJ SOLN
INTRAMUSCULAR | Status: DC | PRN
  Administered 2012-10-22: 2 mg via INTRAVENOUS

## 2012-10-22 MED ORDER — ROCURONIUM BROMIDE 100 MG/10ML IV SOLN
INTRAVENOUS | Status: DC | PRN
  Administered 2012-10-22: 40 mg via INTRAVENOUS
  Administered 2012-10-22: 10 mg via INTRAVENOUS
  Administered 2012-10-22: 5 mg via INTRAVENOUS
  Administered 2012-10-22 (×2): 10 mg via INTRAVENOUS

## 2012-10-22 MED ORDER — HYDROMORPHONE HCL PF 1 MG/ML IJ SOLN
INTRAMUSCULAR | Status: AC
  Filled 2012-10-22: qty 1

## 2012-10-22 MED ORDER — CEFAZOLIN SODIUM-DEXTROSE 2-3 GM-% IV SOLR
2.0000 g | INTRAVENOUS | Status: AC
  Administered 2012-10-22: 2 g via INTRAVENOUS

## 2012-10-22 MED ORDER — ONDANSETRON HCL 4 MG/2ML IJ SOLN: 4.0000 mg | Freq: Four times a day (QID) | INTRAMUSCULAR | Status: DC | PRN

## 2012-10-22 MED ORDER — ASPIRIN EC 81 MG PO TBEC
81.0000 mg | DELAYED_RELEASE_TABLET | Freq: Every day | ORAL | Status: DC
  Administered 2012-10-22 – 2012-10-25 (×4): 81 mg via ORAL
  Filled 2012-10-22 (×5): qty 1

## 2012-10-22 MED ORDER — ATORVASTATIN CALCIUM 10 MG PO TABS: 10.0000 mg | ORAL_TABLET | Freq: Every day | ORAL | Status: DC

## 2012-10-22 MED ORDER — CEFAZOLIN SODIUM-DEXTROSE 2-3 GM-% IV SOLR
2.0000 g | Freq: Three times a day (TID) | INTRAVENOUS | Status: AC
  Administered 2012-10-22 – 2012-10-23 (×3): 2 g via INTRAVENOUS
  Filled 2012-10-22 (×3): qty 50

## 2012-10-22 MED ORDER — NEOSTIGMINE METHYLSULFATE 1 MG/ML IJ SOLN
INTRAMUSCULAR | Status: DC | PRN
  Administered 2012-10-22: 5 mg via INTRAVENOUS

## 2012-10-22 MED ORDER — POTASSIUM CHLORIDE IN NACL 20-0.9 MEQ/L-% IV SOLN
INTRAVENOUS | Status: DC
  Administered 2012-10-22 – 2012-10-24 (×4): via INTRAVENOUS
  Filled 2012-10-22 (×5): qty 1000

## 2012-10-22 MED ORDER — LACTATED RINGERS IV SOLN
INTRAVENOUS | Status: DC | PRN
Start: 2012-10-22 — End: 2012-10-22
  Administered 2012-10-22 (×2): via INTRAVENOUS

## 2012-10-22 MED ORDER — HEPARIN SODIUM (PORCINE) 5000 UNIT/ML IJ SOLN
5000.0000 [IU] | Freq: Once | INTRAMUSCULAR | Status: AC
  Administered 2012-10-22: 5000 [IU] via SUBCUTANEOUS
  Filled 2012-10-22: qty 1

## 2012-10-22 MED ORDER — MORPHINE SULFATE 2 MG/ML IJ SOLN
2.0000 mg | INTRAMUSCULAR | Status: DC | PRN
  Administered 2012-10-22 (×5): 2 mg via INTRAVENOUS
  Filled 2012-10-22 (×5): qty 1

## 2012-10-22 MED ORDER — EPHEDRINE SULFATE 50 MG/ML IJ SOLN
INTRAMUSCULAR | Status: DC | PRN
  Administered 2012-10-22: 5 mg via INTRAVENOUS

## 2012-10-22 MED ORDER — 0.9 % SODIUM CHLORIDE (POUR BTL) OPTIME
TOPICAL | Status: DC | PRN
  Administered 2012-10-22: 1000 mL

## 2012-10-22 MED ORDER — FENTANYL CITRATE 0.05 MG/ML IJ SOLN
INTRAMUSCULAR | Status: DC | PRN
  Administered 2012-10-22 (×5): 50 ug via INTRAVENOUS
  Administered 2012-10-22: 100 ug via INTRAVENOUS

## 2012-10-22 MED ORDER — LIDOCAINE HCL (CARDIAC) 20 MG/ML IV SOLN
INTRAVENOUS | Status: DC | PRN
  Administered 2012-10-22: 100 mg via INTRAVENOUS

## 2012-10-22 MED ORDER — BUPIVACAINE-EPINEPHRINE 0.5% -1:200000 IJ SOLN
INTRAMUSCULAR | Status: DC | PRN
  Administered 2012-10-22: 36 mL

## 2012-10-22 MED ORDER — BUPIVACAINE-EPINEPHRINE 0.5% -1:200000 IJ SOLN
INTRAMUSCULAR | Status: AC
  Filled 2012-10-22: qty 1

## 2012-10-22 MED ORDER — HYDROMORPHONE HCL PF 1 MG/ML IJ SOLN
0.2500 mg | INTRAMUSCULAR | Status: DC | PRN
  Administered 2012-10-22 (×4): 0.5 mg via INTRAVENOUS

## 2012-10-22 MED ORDER — HEPARIN SODIUM (PORCINE) 5000 UNIT/ML IJ SOLN
5000.0000 [IU] | Freq: Three times a day (TID) | INTRAMUSCULAR | Status: DC
  Administered 2012-10-23 – 2012-10-26 (×9): 5000 [IU] via SUBCUTANEOUS
  Filled 2012-10-22 (×12): qty 1

## 2012-10-22 MED ORDER — GLYCOPYRROLATE 0.2 MG/ML IJ SOLN
INTRAMUSCULAR | Status: DC | PRN
  Administered 2012-10-22: .8 mg via INTRAVENOUS

## 2012-10-22 MED ORDER — FLUTICASONE PROPIONATE 50 MCG/ACT NA SUSP
1.0000 | Freq: Every day | NASAL | Status: DC | PRN
  Filled 2012-10-22: qty 16

## 2012-10-22 MED ORDER — HYDROCODONE-ACETAMINOPHEN 10-325 MG PO TABS
2.0000 | ORAL_TABLET | ORAL | Status: DC | PRN
  Administered 2012-10-22 – 2012-10-26 (×18): 2 via ORAL
  Filled 2012-10-22 (×18): qty 2

## 2012-10-22 MED ORDER — ACETAMINOPHEN 10 MG/ML IV SOLN
INTRAVENOUS | Status: DC | PRN
  Administered 2012-10-22: 1000 mg via INTRAVENOUS

## 2012-10-22 MED ORDER — PROPOFOL 10 MG/ML IV BOLUS
INTRAVENOUS | Status: DC | PRN
  Administered 2012-10-22: 170 mg via INTRAVENOUS

## 2012-10-22 MED ORDER — CHLORHEXIDINE GLUCONATE 4 % EX LIQD
1.0000 "application " | Freq: Once | CUTANEOUS | Status: DC
  Filled 2012-10-22: qty 15

## 2012-10-22 MED ORDER — ACETAMINOPHEN 10 MG/ML IV SOLN
INTRAVENOUS | Status: AC
  Filled 2012-10-22: qty 100

## 2012-10-22 MED ORDER — CEFAZOLIN SODIUM-DEXTROSE 2-3 GM-% IV SOLR
INTRAVENOUS | Status: AC
  Filled 2012-10-22: qty 50

## 2012-10-22 SURGICAL SUPPLY — 54 items
APPLIER CLIP 5 13 M/L LIGAMAX5 (MISCELLANEOUS)
BENZOIN TINCTURE PRP APPL 2/3 (GAUZE/BANDAGES/DRESSINGS) IMPLANT
BINDER ABD UNIV 12 45-62 (WOUND CARE) ×1 IMPLANT
BINDER ABDOMINAL 46IN 62IN (WOUND CARE) ×2
CANISTER SUCTION 2500CC (MISCELLANEOUS) ×2 IMPLANT
CANNULA ENDOPATH XCEL 11M (ENDOMECHANICALS) IMPLANT
CHLORAPREP W/TINT 26ML (MISCELLANEOUS) ×2 IMPLANT
CLIP APPLIE 5 13 M/L LIGAMAX5 (MISCELLANEOUS) IMPLANT
CLOTH BEACON ORANGE TIMEOUT ST (SAFETY) ×2 IMPLANT
DECANTER SPIKE VIAL GLASS SM (MISCELLANEOUS) ×2 IMPLANT
DERMABOND ADVANCED (GAUZE/BANDAGES/DRESSINGS) ×1
DERMABOND ADVANCED .7 DNX12 (GAUZE/BANDAGES/DRESSINGS) ×1 IMPLANT
DEVICE SECURE STRAP 25 ABSORB (INSTRUMENTS) IMPLANT
DEVICE TROCAR PUNCTURE CLOSURE (ENDOMECHANICALS) ×2 IMPLANT
DISSECTOR BLUNT TIP ENDO 5MM (MISCELLANEOUS) IMPLANT
DRAIN CHANNEL RND F F (WOUND CARE) IMPLANT
DRAPE INCISE IOBAN 66X45 STRL (DRAPES) IMPLANT
DRAPE LAPAROSCOPIC ABDOMINAL (DRAPES) ×2 IMPLANT
DRAPE POUCH INSTRU U-SHP 10X18 (DRAPES) IMPLANT
DRAPE UTILITY XL STRL (DRAPES) ×2 IMPLANT
ELECT REM PT RETURN 9FT ADLT (ELECTROSURGICAL) ×2
ELECTRODE REM PT RTRN 9FT ADLT (ELECTROSURGICAL) ×1 IMPLANT
GLOVE BIOGEL PI IND STRL 7.0 (GLOVE) ×1 IMPLANT
GLOVE BIOGEL PI INDICATOR 7.0 (GLOVE) ×1
GLOVE EUDERMIC 7 POWDERFREE (GLOVE) ×2 IMPLANT
GOWN STRL NON-REIN LRG LVL3 (GOWN DISPOSABLE) ×2 IMPLANT
GOWN STRL REIN XL XLG (GOWN DISPOSABLE) ×6 IMPLANT
HAND ACTIVATED (MISCELLANEOUS) ×2 IMPLANT
KIT BASIN OR (CUSTOM PROCEDURE TRAY) ×2 IMPLANT
MARKER SKIN DUAL TIP RULER LAB (MISCELLANEOUS) ×2 IMPLANT
MESH PARIETEX 20X15 (Mesh General) ×2 IMPLANT
NEEDLE INSUFFLATION 14GA 150MM (NEEDLE) IMPLANT
NEEDLE SPNL 22GX3.5 QUINCKE BK (NEEDLE) ×2 IMPLANT
NS IRRIG 1000ML POUR BTL (IV SOLUTION) ×2 IMPLANT
PACK GENERAL/GYN (CUSTOM PROCEDURE TRAY) IMPLANT
PENCIL BUTTON HOLSTER BLD 10FT (ELECTRODE) IMPLANT
POUCH SPECIMEN RETRIEVAL 10MM (ENDOMECHANICALS) IMPLANT
SCISSORS LAP 5X35 DISP (ENDOMECHANICALS) ×2 IMPLANT
SET IRRIG TUBING LAPAROSCOPIC (IRRIGATION / IRRIGATOR) IMPLANT
SOLUTION ANTI FOG 6CC (MISCELLANEOUS) ×2 IMPLANT
STAPLER VISISTAT 35W (STAPLE) ×2 IMPLANT
STRIP CLOSURE SKIN 1/2X4 (GAUZE/BANDAGES/DRESSINGS) IMPLANT
SUT MNCRL AB 4-0 PS2 18 (SUTURE) ×2 IMPLANT
SUT NOVA 0 T19/GS 22DT (SUTURE) ×4 IMPLANT
TACKER 5MM HERNIA 3.5CML NAB (ENDOMECHANICALS) ×4 IMPLANT
TOWEL OR 17X26 10 PK STRL BLUE (TOWEL DISPOSABLE) ×4 IMPLANT
TRAY FOLEY CATH 14FRSI W/METER (CATHETERS) ×2 IMPLANT
TRAY LAP CHOLE (CUSTOM PROCEDURE TRAY) ×2 IMPLANT
TROCAR BLADELESS OPT 5 75 (ENDOMECHANICALS) ×8 IMPLANT
TROCAR XCEL BLUNT TIP 100MML (ENDOMECHANICALS) IMPLANT
TROCAR XCEL NON-BLD 11X100MML (ENDOMECHANICALS) ×2 IMPLANT
TUBING FILTER THERMOFLATOR (ELECTROSURGICAL) ×2 IMPLANT
TUBING INSUFFLATION 10FT LAP (TUBING) ×2 IMPLANT
WATER STERILE IRR 1500ML POUR (IV SOLUTION) IMPLANT

## 2012-10-22 NOTE — Anesthesia Postprocedure Evaluation (Signed)
  Anesthesia Post-op Note  Patient: Jeffrey [name]  Procedure(s) Performed: Procedure(s) (LRB): LAPAROSCOPIC INCISIONAL HERNIA (N/A) INSERTION OF MESH (N/A)  Patient Location: PACU  Anesthesia Type: General  Level of Consciousness: awake and alert   Airway and Oxygen Therapy: Patient Spontanous Breathing  Post-op Pain: mild  Post-op Assessment: Post-op Vital signs reviewed, Patient's Cardiovascular Status Stable, Respiratory Function Stable, Patent Airway and No signs of Nausea or vomiting  Last Vitals:  Filed Vitals:   10/22/12 1345  BP: 156/83  Pulse: 94  Temp:   Resp: 15    Post-op Vital Signs: stable   Complications: No apparent anesthesia complications

## 2012-10-22 NOTE — Anesthesia Procedure Notes (Signed)
Procedure Name: Intubation Date/Time: 10/22/2012 11:18 AM Performed by: [place], Anais Koenen C Pre-anesthesia Checklist: Patient identified, Emergency Drugs available, Suction available and Patient being monitored Patient Re-evaluated:Patient Re-evaluated prior to inductionOxygen Delivery Method: Circle system utilized Preoxygenation: Pre-oxygenation with 100% oxygen Intubation Type: IV induction Ventilation: Mask ventilation without difficulty and Oral airway inserted - appropriate to patient size Grade View: Grade III Tube type: Oral Tube size: 7.5 mm Number of attempts: 1 Airway Equipment and Method: Stylet Placement Confirmation: ETT inserted through vocal cords under direct vision,  positive ETCO2 and breath sounds checked- equal and bilateral Secured at: 22 cm Tube secured with: Tape Dental Injury: Teeth and Oropharynx as per pre-operative assessment

## 2012-10-22 NOTE — Op Note (Signed)
Patient Name:           Jeffrey [name]   Date of Surgery:        10/22/2012  Pre op Diagnosis:      Incarcerated ventral incisional hernia  Post op Diagnosis:    Incarcerated ventral incisional hernia  Procedure:                 Laparoscopic repair of ventral incisional hernia, implantation of mesh  Surgeon:                     Angelia Mould. Derrell Lolling, M.D., FACS  Assistant:                      None  Operative Indications:   Jeffrey [name] is a 62 y.o. male. He is referred by Dr. Gaetana Michaelis in [place] for evaluation of a symptomatic ventral hernia. Dr. Dwana Melena is his primary care physician.   This patient is generally healthy. 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 Leticia Penna. 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 He found a couple of other diverticula nearby and did a small bowel resection. The patient was hospitalized for one week. Postop he did fairly well although he had drainage from his wound for about 2 months. He now has completely healed the wound. He now feels a bulge just above and to the left of the umbilicus. This causes some burning and stinging. No history of incarceration.    Recent CT scan shows a hernia in the abdominal wall in the mid abdomen. This appears to be just at and above the umbilicus. He was evaluated as an outpatient and is brought to the operating room electively.Marland Kitchen  He is generally healthy. Dr. Zachery Dakins performed an umbilical hernia repair without mesh about 8 years ago. He has GERD symptoms and hyperlipidemia   Operative Findings:       There was an incarcerated incisional hernia in the periumbilical area and above the umbilicus. The defect was about 4 or 5 cm in diameter. There were some thinned out areas in the midline above and below this but no other hernias. There was incarcerated omentum up in  the hernia that required an 15 minute lysis of adhesions. The hernia was repaired with a 20 cm X 15 cm piece of parietex composite  mesh  Procedure in Detail:          Following the induction of general endotracheal anesthesia a Foley catheter was placed, the abdomen was prepped and draped in a sterile fashion, and intravenous antibiotics were given. Surgical time out was performed. 0.5% Marcaine with epinephrine was used as a local infiltration anesthetic. A 5 mm optical trocar was placed in the left subcostal area laterally. Optical entry was uneventful and there were no adhesions in the region of the trocar entry. Pneumoperitoneum was created. Videocamera was inserted. I surveyed   all the adhesions to the anterior -abdominal wall but this was mostly omentum. I put a 5 mm trocar in the left lower quadrant, another 5 mm trocar in the left epigastrium and ultimately two 5 mm trocars on the right flank. I took all the adhesions down from the anterior  abdominal wall from the suprapubic area all the way up to the falciform ligament. I used a cold scissors and for some  of this I used a Harmonic scalpel. I took the falciform ligament down as well. I measured the hernia defect with a spinal needle and found that it by using a 20 cm vertically by 15 cm transversely mesh that I would have 5-7 cm overlap in all directions. We brought this size mesh to the operative field. I marked a template on the mesh and the abdominal wall for 8 equidistant suture fixation sites. I placed 8 sutures of #1 Novofil in the mesh, being careful to keep the rough side up and the smooth side down. I placed the 11 mm trocar in the left flank after removing one of the 5 mm trocars. Pneumoperitoneum was reestablished and after moistening the mesh I rolled it up and inserted it  into the abdominal cavity. The mesh was positioned carefully, once again making sure the rough side was up toward the abdominal wall and the smooth side was down toward  the viscera. I made small stab wounds at the planned suture fixation sites and brought all of the 8 suture fixation sutures up through the abdominal wall, being careful to take a 1 cm bite of fascia. After all the sutures were placed they were elevated and the mesh was positioned and deployed nicely with almost no redundancy. I tied all 8 suture fixation sutures. They looked good. I then further secured the mesh with a 5 mm pro-tacker. I used about 60 tacks using a double crown technique. This was inspected from the right and the left side repeatedly and I found that I had no defects in the mesh. I checked the small bowel and omentum and they did not appear to be injured at all. The omentum was placed on top of the small bowel. The trocars were removed. The pneumoperitoneum was released. All the skin incisions were closed with subcuticular sutures of 4-0 Monocryl and Dermabond. Velcro binder was placed the patient taken to recovery room in stable condition. EBL 25 cc. Counts correct. Complications none.     Angelia Mould. Derrell Lolling, M.D., FACS General and Minimally Invasive Surgery Breast and Colorectal Surgery  10/22/2012 1:06 PM

## 2012-10-22 NOTE — Anesthesia Preprocedure Evaluation (Addendum)
Anesthesia Evaluation  Patient identified by MRN, date of birth, ID band Patient awake    Reviewed: Allergy & Precautions, H&P , NPO status , Patient's Chart, lab work & pertinent test results  Airway Mallampati: II TM Distance: >3 FB Neck ROM: full    Dental  (+) Caps and Dental Advisory Given,    Pulmonary neg pulmonary ROS,  breath sounds clear to auscultation  Pulmonary exam normal       Cardiovascular Exercise Tolerance: Good negative cardio ROS  Rhythm:regular Rate:Normal     Neuro/Psych negative neurological ROS  negative psych ROS   GI/Hepatic negative GI ROS, Neg liver ROS, Controlled,  Endo/Other  negative endocrine ROS  Renal/GU negative Renal ROS  negative genitourinary   Musculoskeletal   Abdominal   Peds  Hematology negative hematology ROS (+)   Anesthesia Other Findings   Reproductive/Obstetrics negative OB ROS                          Anesthesia Physical Anesthesia Plan  ASA: I  Anesthesia Plan: General   Post-op Pain Management:    Induction: Intravenous  Airway Management Planned: Oral ETT  Additional Equipment:   Intra-op Plan:   Post-operative Plan: Extubation in OR  Informed Consent: I have reviewed the patients History and Physical, chart, labs and discussed the procedure including the risks, benefits and alternatives for the proposed anesthesia with the patient or authorized representative who has indicated his/her understanding and acceptance.   Dental Advisory Given  Plan Discussed with: CRNA and Surgeon  Anesthesia Plan Comments:         Anesthesia Quick Evaluation

## 2012-10-22 NOTE — Transfer of Care (Signed)
Immediate Anesthesia Transfer of Care Note  Patient: Jeffrey [name]  Procedure(s) Performed: Procedure(s) (LRB) with comments: LAPAROSCOPIC INCISIONAL HERNIA (N/A) - incarcerated INSERTION OF MESH (N/A)  Patient Location: PACU  Anesthesia Type:General  Level of Consciousness: awake and alert   Airway & Oxygen Therapy: Patient Spontanous Breathing and Patient connected to face mask oxygen  Post-op Assessment: Report given to PACU RN and Post -op Vital signs reviewed and stable  Post vital signs: Reviewed and stable  Complications: No apparent anesthesia complications

## 2012-10-22 NOTE — Interval H&P Note (Signed)
History and Physical Interval Note:  10/22/2012 11:00 AM  Jeffrey [name]  has presented today for surgery, with the diagnosis of incisional hernia  The goals and the various methods of treatment have been discussed with the patient and family. After consideration of risks, benefits and other options for treatment, the patient has consented to  Procedure(s) (LRB) with comments: LAPAROSCOPIC INCISIONAL HERNIA (N/A) HERNIA REPAIR INCISIONAL (N/A) INSERTION OF MESH (N/A) , possible open, as a surgical intervention .  The patient's history has been reviewed, patient examined today, no change in status, stable for surgery.  I have reviewed the patient's chart and labs.  Questions were answered to the patient's satisfaction.     Ernestene Mention

## 2012-10-23 NOTE — Progress Notes (Signed)
1 Day Post-Op  Subjective: Having pain when he tries to get out of bed.  Otherwise doing ok.  No flatus yet.  However, no belching or nausea with full liquid breakfast.    Objective: Vital signs in last 24 hours: Temp:  [97.7 F (36.5 C)-99.8 F (37.7 C)] 99.6 F (37.6 C) (01/04 0544) Pulse Rate:  [71-103] 81  (01/04 0544) Resp:  [12-18] 16  (01/04 0544) BP: (112-173)/(61-90) 112/61 mmHg (01/04 0544) SpO2:  [94 %-100 %] 96 % (01/04 0544) Weight:  [203 lb 1.6 oz (92.126 kg)] 203 lb 1.6 oz (92.126 kg) (01/03 1400)    Intake/Output from previous day: 01/03 0701 - 01/04 0700 In: 3103.3 [P.O.:120; I.V.:2933.3; IV Piggyback:50] Out: 1001 [Urine:976; Blood:25] Intake/Output this shift: Total I/O In: 100 [Other:100] Out: -   General appearance: alert, cooperative and no distress GI: soft, non distended, approp tender.  wounds c/d/i.  binder in place a  Lab Results:  No results found for this basename: WBC:2,HGB:2,HCT:2,PLT:2 in the last 72 hours BMET No results found for this basename: NA:2,K:2,CL:2,CO2:2,GLUCOSE:2,BUN:2,CREATININE:2,CALCIUM:2 in the last 72 hours PT/INR No results found for this basename: LABPROT:2,INR:2 in the last 72 hours ABG No results found for this basename: PHART:2,PCO2:2,PO2:2,HCO3:2 in the last 72 hours  Studies/Results: No results found.  Anti-infectives: Anti-infectives     Start     Dose/Rate Route Frequency Ordered Stop   10/22/12 1500   ceFAZolin (ANCEF) IVPB 2 g/50 mL premix        2 g 100 mL/hr over 30 Minutes Intravenous 3 times per day 10/22/12 1426 10/23/12 0622   10/22/12 1001   ceFAZolin (ANCEF) IVPB 2 g/50 mL premix        2 g 100 mL/hr over 30 Minutes Intravenous On call to O.R. 10/22/12 1001 10/22/12 1122          Assessment/Plan: s/p Procedure(s) (LRB) with comments: LAPAROSCOPIC INCISIONAL HERNIA (N/A) - incarcerated INSERTION OF MESH (N/A) Advance diet if tolerated, can have reg diet for evening meal. IS, ambulate.     Hope for d/c tomorrow.    LOS: 1 day    [facility] 10/23/2012

## 2012-10-24 MED ORDER — DIPHENHYDRAMINE HCL 50 MG/ML IJ SOLN: 12.5000 mg | Freq: Four times a day (QID) | INTRAMUSCULAR | Status: DC | PRN

## 2012-10-24 MED ORDER — ALUM & MAG HYDROXIDE-SIMETH 200-200-20 MG/5ML PO SUSP: 30.0000 mL | Freq: Four times a day (QID) | ORAL | Status: DC | PRN

## 2012-10-24 MED ORDER — MORPHINE SULFATE 2 MG/ML IJ SOLN: 2.0000 mg | INTRAMUSCULAR | Status: DC | PRN

## 2012-10-24 MED ORDER — ADULT MULTIVITAMIN W/MINERALS CH
1.0000 | ORAL_TABLET | Freq: Every day | ORAL | Status: DC
  Administered 2012-10-24 – 2012-10-25 (×2): 1 via ORAL
  Filled 2012-10-24 (×3): qty 1

## 2012-10-24 MED ORDER — HYDROCODONE-ACETAMINOPHEN 10-325 MG PO TABS: 2.0000 | ORAL_TABLET | ORAL | Status: DC | PRN

## 2012-10-24 MED ORDER — PROMETHAZINE HCL 25 MG/ML IJ SOLN: 12.5000 mg | Freq: Four times a day (QID) | INTRAMUSCULAR | Status: DC | PRN

## 2012-10-24 MED ORDER — NAPROXEN 500 MG PO TABS: 500.0000 mg | ORAL_TABLET | Freq: Two times a day (BID) | ORAL | Status: DC

## 2012-10-24 MED ORDER — NAPROXEN 500 MG PO TABS
500.0000 mg | ORAL_TABLET | Freq: Two times a day (BID) | ORAL | Status: DC
  Administered 2012-10-25 – 2012-10-26 (×3): 500 mg via ORAL
  Filled 2012-10-24 (×6): qty 1

## 2012-10-24 MED ORDER — SODIUM CHLORIDE 0.9 % IJ SOLN
3.0000 mL | Freq: Two times a day (BID) | INTRAMUSCULAR | Status: DC
  Administered 2012-10-24 – 2012-10-25 (×4): 3 mL via INTRAVENOUS

## 2012-10-24 MED ORDER — BISACODYL 10 MG RE SUPP: 10.0000 mg | Freq: Two times a day (BID) | RECTAL | Status: DC | PRN

## 2012-10-24 MED ORDER — LIP MEDEX EX OINT
1.0000 "application " | TOPICAL_OINTMENT | Freq: Two times a day (BID) | CUTANEOUS | Status: DC
  Administered 2012-10-25: 1 via TOPICAL
  Filled 2012-10-24: qty 7

## 2012-10-24 MED ORDER — LACTATED RINGERS IV BOLUS (SEPSIS): 1000.0000 mL | Freq: Three times a day (TID) | INTRAVENOUS | Status: DC | PRN

## 2012-10-24 MED ORDER — MAGIC MOUTHWASH
15.0000 mL | Freq: Four times a day (QID) | ORAL | Status: DC | PRN
  Filled 2012-10-24: qty 15

## 2012-10-24 MED ORDER — SODIUM CHLORIDE 0.9 % IJ SOLN: 3.0000 mL | INTRAMUSCULAR | Status: DC | PRN

## 2012-10-24 NOTE — Progress Notes (Signed)
Jeffrey [name] [id number] Mar 03, 1951   Subjective:  Feeling better Pain controlled Tolerating fulls No flatus/BM yet Wife in room Walking in hallways  Objective:  Vital signs:  Filed Vitals:   10/23/12 1324 10/23/12 2120 10/24/12 0540 10/24/12 0855  BP: 117/66 148/83 124/80   Pulse: 77 86 83   Temp: 99.2 F (37.3 C) 98.5 F (36.9 C) 98.5 F (36.9 C) 97.9 F (36.6 C)  TempSrc: Oral Oral Oral Oral  Resp: 16 18 18    Height:      Weight:      SpO2: 95% 96% 97%     Last BM Date: 10/21/12  Intake/Output   Yesterday:  01/04 0701 - 01/05 0700 In: 2005.8 [P.O.:480; I.V.:1525.8] Out: 1150 [Urine:1150] This shift:  Total I/O In: 240 [P.O.:240] Out: 250 [Urine:250]  Bowel function:  Flatus: n  BM: n  Physical Exam:  General: Pt awake/alert/oriented x4 in no acute distress Eyes: PERRL, normal EOM.  Sclera clear.  No icterus Neuro: CN II-XII intact w/o focal sensory/motor deficits. Lymph: No head/neck/groin lymphadenopathy Psych:  No delerium/psychosis/paranoia HENT: Normocephalic, Mucus membranes moist.  No thrush Neck: Supple, No tracheal deviation Chest: No chest wall pain w good excursion CV:  Pulses intact.  Regular rhythm MS: Normal AROM mjr joints.  No obvious deformity Abdomen: Soft.  Nondistended.  Abd binder in place.  Mildly tender at incisions only.  No incarcerated hernias. Ext:  SCDs BLE.  No mjr edema.  No cyanosis Skin: No petechiae / purpurae  Problem List:  Active Problems:  Incisional hernia   Assessment  Jeffrey [name]  62 y.o. male  2 Days Post-Op  Procedure(s): LAPAROSCOPIC INCISIONAL HERNIA INSERTION OF MESH  Recovering but w still some ileus  Plan:  -adv diet gradually -stop IVF -increase non-narcotic pain control -VTE prophylaxis- SCDs, etc -mobilize as tolerated to help recovery  D/C patient from hospital when patient meets criteria (hopefully later today):  Tolerating oral intake well Ambulating in  walkways Adequate pain control without IV medications Urinating  Having flatus   Ardeth Sportsman, M.D., F.A.C.S. Gastrointestinal and Minimally Invasive Surgery [facility] [address], [address] [place], [place] [address] [phone] Main / Paging [phone] Voice Mail   10/24/2012  CARE TEAM:  PCP: Dwana Melena, MD  Outpatient Care Team: Patient Care Team: Dwana Melena, MD as PCP - General (Internal Medicine)  Inpatient Treatment Team: Treatment Team: Attending Provider: Ernestene Mention, MD; Registered Nurse: Tristan Schroeder, RN; Registered Nurse: Rometta Emery, RN; Technician: Lynden Ang, NT; Registered Nurse: Baltazar Najjar, RN; Registered Nurse: Bethann Goo, RN   Results:   Labs: No results found for this or any previous visit (from the past 48 hour(s)).  Imaging / Studies: No results found.  Medications / Allergies: per chart  Antibiotics: Anti-infectives     Start     Dose/Rate Route Frequency Ordered Stop   10/22/12 1500   ceFAZolin (ANCEF) IVPB 2 g/50 mL premix        2 g 100 mL/hr over 30 Minutes Intravenous 3 times per day 10/22/12 1426 10/23/12 0622   10/22/12 1001   ceFAZolin (ANCEF) IVPB 2 g/50 mL premix        2 g 100 mL/hr over 30 Minutes Intravenous On call to O.R. 10/22/12 1001 10/22/12 1122

## 2012-10-25 ENCOUNTER — Encounter (HOSPITAL_COMMUNITY): Payer: Self-pay | Admitting: General Surgery

## 2012-10-25 LAB — CBC
HCT: 37.3 % — ABNORMAL LOW (ref 39.0–52.0)
Hemoglobin: 13.2 g/dL (ref 13.0–17.0)
MCH: 32 pg (ref 26.0–34.0)
MCHC: 35.4 g/dL (ref 30.0–36.0)
MCV: 90.3 fL (ref 78.0–100.0)

## 2012-10-25 LAB — BASIC METABOLIC PANEL
BUN: 8 mg/dL (ref 6–23)
Creatinine, Ser: 0.84 mg/dL (ref 0.50–1.35)
GFR calc non Af Amer: >90 mL/min (ref >90)
Glucose, Bld: 94 mg/dL (ref 70–99)
Potassium: 4.6 mEq/L (ref 3.5–5.1)

## 2012-10-25 MED ORDER — POLYETHYLENE GLYCOL 3350 17 G PO PACK
17.0000 g | PACK | Freq: Every day | ORAL | Status: DC
  Administered 2012-10-25 – 2012-10-26 (×2): 17 g via ORAL
  Filled 2012-10-25: qty 1

## 2012-10-25 NOTE — Progress Notes (Addendum)
3 Days Post-Op  Subjective: Feeling pretty good. Thinks he is about ready to pass flatus. No flatus or stool yet. Ambulating 6-8 times per day. Voiding well. Starting to eat a little bit of solid food, but intake limited. No nausea or vomiting.  Afebrile. Vital signs stable.  Objective: Vital signs in last 24 hours: Temp:  [97.9 F (36.6 C)-98.6 F (37 C)] 98.1 F (36.7 C) (01/06 0545) Pulse Rate:  [74-76] 76  (01/06 0545) Resp:  [18] 18  (01/06 0545) BP: (108-129)/(63-77) 129/77 mmHg (01/06 0545) SpO2:  [93 %-99 %] 97 % (01/06 0545) Last BM Date: 10/21/12  Intake/Output from previous day: 01/05 0701 - 01/06 0700 In: 2100 [P.O.:2100] Out: 1100 [Urine:1100] Intake/Output this shift: Total I/O In: 480 [P.O.:480] Out: 250 [Urine:250]  General appearance: alert. Friendly. Cooperative. Mental status normal. No distress. Resp: clear to auscultation bilaterally GI: abdomen soft. Bowel sounds present, hypoactive. Wounds look fine. Does not appear distended. Unremarkable postop exam.  Lab Results:  No results found for this or any previous visit (from the past 24 hour(s)).   Studies/Results: @RISRSLT24 @     . aspirin EC  81 mg Oral QHS  . atorvastatin  20 mg Oral q1800  . heparin subcutaneous  5,000 Units Subcutaneous Q8H  . lip balm  1 application Topical BID  . multivitamin with minerals  1 tablet Oral Daily  . naproxen  500 mg Oral BID WC  . polyethylene glycol  17 g Oral Daily  . sodium chloride  3 mL Intravenous Q12H     Assessment/Plan: s/p Procedure(s): LAPAROSCOPIC INCISIONAL HERNIA INSERTION OF MESH  POD#3 - Laparoscopic repair of ventral incisional hernia with mesh, lysis of adhesions. Stable. Await resolution of ileus. Check lab work today to be sure that no electrolyte imbalance MiraLAX today PO  hydration encouraged and emphasized Discharge home when tolerating diet better and bowel function returned. Hopefully by tomorrow.    LOS: 3 days     Jesselle Laflamme M. Derrell Lolling, M.D., [facility] General and Minimally invasive Surgery Breast and Colorectal Surgery Office:   [phone] Pager:   [phone]  10/25/2012  . .prob

## 2012-10-26 MED ORDER — HYDROCODONE-ACETAMINOPHEN 5-325 MG PO TABS: 1.0000 | ORAL_TABLET | ORAL | Status: DC | PRN

## 2012-10-26 MED ORDER — BISACODYL 10 MG RE SUPP
10.0000 mg | Freq: Once | RECTAL | Status: AC
  Administered 2012-10-26: 10 mg via RECTAL
  Filled 2012-10-26: qty 1

## 2012-10-26 MED ORDER — POLYETHYLENE GLYCOL 3350 17 G PO PACK
17.0000 g | PACK | Freq: Once | ORAL | Status: DC
  Filled 2012-10-26: qty 1

## 2012-10-26 NOTE — Discharge Summary (Addendum)
Patient ID: JAMOL GINYARD [id number] 62 y.o. 03/28/1951  10/22/2012  Discharge date and time: October 26, 2012  Admitting Physician: Ernestene Mention  Discharge Physician: Ernestene Mention  Admission Diagnoses: incisional hernia  Discharge Diagnoses: Incarcerated incisional hernia  Operations: Procedure(s): LAPAROSCOPIC INCISIONAL HERNIA INSERTION OF MESH  Admission Condition: good  Discharged Condition: good  Indication for Admission: ABDIKADIR FOHL is a 62 y.o. male. He was referred by Dr. Roetta Sessions in [place] for evaluation of a symptomatic ventral hernia. Dr. Dwana Melena is his primary care physician. This patient is generally healthy. 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 Leticia Penna. 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 He found a couple of other diverticula nearby and did a small bowel resection. The patient was hospitalized for one week. Postop he did fairly well although he had drainage from his wound for about 2 months. He now has completely healed the wound. He now feels a bulge just above and to the left of the umbilicus. This causes some burning and stinging. No history of incarceration.  Recent CT scan shows a hernia in the abdominal wall in the mid abdomen. This appears to be just at and above the umbilicus.  He was evaluated as an outpatient and I found that he had a reducible incisional hernia in the region of the umbilicus with a defect probably less than 6 cm. He is generally healthy. Dr. Zachery Dakins performed an umbilical hernia repair without mesh about 8 years ago. He has GERD symptoms and hyperlipidemia.  Hospital Course: On the day of admission the patient was taken to the operating room. We were able to perform a laparoscopic Incisional hernia repair with mesh. He had lots of omentum  incarcerated in the hernia but no small bowel. After reducing the omentum we were able to repair this with a 20 cm x 15 cm piece of parietex composite mesh. Postoperatively he did well. He was slow to resume bowel function. The catheter was removed and he voided uneventfully. He was able to ambulate independently and tolerate a diet. Marland Kitchen He was given MiraLAX and suppositories and resumed passing lots of flatus and tolerating a regular diet. He had not had a bowel movement the time of discharge but otherwise looked well. Examination at time of discharge revealed a soft flat abdomen with good bowel sounds. Patient appeared well and was in no distress. Lab work looked normal. I felt it was reasonable to discharge home with continued MiraLAX as an outpatient. He was given instructions in diet and activities. He was given a prescription for Vicodin for pain. He was asked to return to see me in my office in 2 weeks.  Consults: None  Significant Diagnostic Studies: none  Treatments: surgery: Laparoscopic lysis of adhesions, laparoscopic repair of incisional hernia with mesh.  Disposition: Home  Patient Instructions:   Jadd, Gasior  Home Medication Instructions [id number]   Printed on:10/26/12 0645  Medication Information                    rosuvastatin (CRESTOR) 10 MG tablet Take 10 mg by mouth at bedtime.            aspirin EC 81 MG tablet Take 81 mg by mouth at bedtime.            fluticasone (FLONASE) 50 MCG/ACT nasal spray Place 1  spray into the nose daily as needed. Allergies           Multiple Vitamin (MULTIVITAMIN WITH MINERALS) TABS Take 1 tablet by mouth daily.           Krill Oil Ultra Strength 1500 MG CAPS Take by mouth daily.           docusate sodium (COLACE) 100 MG capsule Take 100 mg by mouth daily as needed. For constipation           HYDROcodone-acetaminophen (NORCO) 10-325 MG per tablet Take 2 tablets by mouth every 4 (four) hours as needed.           naproxen  (NAPROSYN) 500 MG tablet Take 1 tablet (500 mg total) by mouth 2 (two) times daily with a meal.           HYDROcodone-acetaminophen (NORCO/VICODIN) 5-325 MG per tablet Take 1-2 tablets by mouth every 4 (four) hours as needed for pain.             Activity: as instructed. No sports or heavy lifting for 4-5 weeks. Diet: low fat, low cholesterol diet Wound Care: none needed  Follow-up:  With Dr. Derrell Lolling in 2 weeks.  Signed: Angelia Mould. Derrell Lolling, M.D., FACS General and minimally invasive surgery Breast and Colorectal Surgery  10/26/2012, 6:45 AM

## 2012-10-26 NOTE — Progress Notes (Signed)
4 Days Post-Op  Subjective: Alert. Stable. Says he feels much better and wants to go home. Tolerating regular diet but eating less than 50% of tray. Passing lots of flatus but no stool. No nausea or vomiting. Pain under good control. Very active.  CBC and be met yesterday are normal. Afebrile. Vital signs stable.  Objective: Vital signs in last 24 hours: Temp:  [98.2 F (36.8 C)] 98.2 F (36.8 C) (01/06 2200) Pulse Rate:  [69-87] 69  (01/06 2200) Resp:  [18] 18  (01/06 2200) BP: (123-132)/(82-87) 132/87 mmHg (01/06 2200) SpO2:  [96 %-98 %] 98 % (01/06 2200) Last BM Date: 10/21/12  Intake/Output from previous day: 01/06 0701 - 01/07 0700 In: 200 [P.O.:200] Out: 750 [Urine:750] Intake/Output this shift: Total I/O In: 200 [P.O.:200] Out: 0   General appearance: patient is alert. Looks well. Good spirits. Cooperative. No distress. GI: abdomen soft. Wound is clean. Not distended or tympanitic. Marland Kitchen Bowel sounds present. No obvious seromas.  Lab Results:  Results for orders placed during the hospital encounter of 10/22/12 (from the past 24 hour(s))  CBC     Status: Abnormal   Collection Time   10/25/12  6:52 AM      Component Value Range   WBC 4.9  4.0 - 10.5 K/uL   RBC 4.13 (*) 4.22 - 5.81 MIL/uL   Hemoglobin 13.2  13.0 - 17.0 g/dL   HCT 60.4 (*) 54.0 - 98.1 %   MCV 90.3  78.0 - 100.0 fL   MCH 32.0  26.0 - 34.0 pg   MCHC 35.4  30.0 - 36.0 g/dL   RDW 19.1  47.8 - 29.5 %   Platelets 129 (*) 150 - 400 K/uL  BASIC METABOLIC PANEL     Status: Normal   Collection Time   10/25/12  6:52 AM      Component Value Range   Sodium 140  135 - 145 mEq/L   Potassium 4.6  3.5 - 5.1 mEq/L   Chloride 102  96 - 112 mEq/L   CO2 28  19 - 32 mEq/L   Glucose, Bld 94  70 - 99 mg/dL   BUN 8  6 - 23 mg/dL   Creatinine, Ser 6.21  0.50 - 1.35 mg/dL   Calcium 9.8  8.4 - 30.8 mg/dL   GFR calc non Af Amer >90  >90 mL/min   GFR calc Af Amer >90  >90 mL/min     Studies/Results: @RISRSLT24 @     .  aspirin EC  81 mg Oral QHS  . atorvastatin  20 mg Oral q1800  . bisacodyl  10 mg Rectal Once  . heparin subcutaneous  5,000 Units Subcutaneous Q8H  . lip balm  1 application Topical BID  . multivitamin with minerals  1 tablet Oral Daily  . naproxen  500 mg Oral BID WC  . polyethylene glycol  17 g Oral Daily  . polyethylene glycol  17 g Oral Once  . sodium chloride  3 mL Intravenous Q12H     Assessment/Plan: s/p Procedure(s): LAPAROSCOPIC INCISIONAL HERNIA INSERTION OF MESH  POD #4-laparoscopic repair of ventral incisional hernia with mesh, lysis of adhesions. Doing well. Despite the absence of bowel movement, he looks well and meets discharge criteria for discharge home. We'll discharge home today. Dose of MiraLAX and Dulcolax suppository this morning.  Diet and activities discussed MiraLAX twice daily until he began stooling Prescription for Vicodin given Return to see me in 2 weeks.    LOS: 4 days  Mashawn Brazil MHaywood M. Derrell Lolling, M.D., [facility] General and Minimally invasive Surgery Breast and Colorectal Surgery Office:   [phone] Pager:   [phone]  10/26/2012  . .prob

## 2012-10-29 ENCOUNTER — Telehealth (INDEPENDENT_AMBULATORY_CARE_PROVIDER_SITE_OTHER): Payer: Self-pay | Admitting: General Surgery

## 2012-10-29 NOTE — Telephone Encounter (Signed)
Called and spoke with patient wife, advised the post op appointment has been set up to see Dr. Derrell Lolling on 11/09/12 at 8:15. Advised to look out of signs of infection, discharge, fever and that any of those things to occur to call our office.

## 2012-11-09 ENCOUNTER — Encounter (INDEPENDENT_AMBULATORY_CARE_PROVIDER_SITE_OTHER): Payer: Self-pay | Admitting: General Surgery

## 2012-11-09 ENCOUNTER — Ambulatory Visit (INDEPENDENT_AMBULATORY_CARE_PROVIDER_SITE_OTHER): Payer: 59 | Admitting: General Surgery

## 2012-11-09 VITALS — BP 132/84 | HR 72 | Temp 97.9°F | Resp 18 | Ht 68.0 in | Wt 189.5 lb

## 2012-11-09 DIAGNOSIS — K432 Incisional hernia without obstruction or gangrene: Secondary | ICD-10-CM

## 2012-11-09 NOTE — Patient Instructions (Signed)
You appear to be healing from your laparoscopic ventral hernia repair without any obvious surgical complications.  No strenuous activities or sports for 5-6 weeks from the date of surgery. You may walk an unlimited amount in the meantime.  Return to see Dr. Derrell Lolling in 6 weeks for a wound check.

## 2012-11-09 NOTE — Progress Notes (Signed)
Patient ID: Jeffrey [name], male   DOB: 1951-05-23, 62 y.o.   MRN: [id number] History: This gentleman underwent laparoscopic repair of ventral incisional hernia with mesh on 10/22/2012. He is doing well. Normal appetite. Normal gastrointestinal function. Incisional pain is resolving normally.  Exam: Patient looks well. In no distress. Abdomen soft. Nontender. All the suture fixation sites are normal. There is no seroma. . There is no hernia  Assessment: Ventral incisional hernia, recovering uneventfully following laparoscopic repair with inlay mesh  Plan: Diet and activities discussed. No sports or heavy lifting until after February 15 Return to see me in 6 weeks for a wound check.    Angelia Mould. Derrell Lolling, M.D., [facility] General and Minimally invasive Surgery Breast and Colorectal Surgery Office:   [phone] Pager:   [phone]

## 2012-12-21 ENCOUNTER — Encounter (INDEPENDENT_AMBULATORY_CARE_PROVIDER_SITE_OTHER): Payer: 59 | Admitting: General Surgery

## 2012-12-22 ENCOUNTER — Telehealth (INDEPENDENT_AMBULATORY_CARE_PROVIDER_SITE_OTHER): Payer: Self-pay | Admitting: General Surgery

## 2012-12-22 NOTE — Telephone Encounter (Signed)
Called and left message for patient on home phone to advise of appointment scheduled for him to see Dr. Derrell Lolling. Set up to for 12/27/12 at 3:45. Patient appointment originally set up for 12/21/12 was cancelled due to office closure (weather). Requested patient call back to confirm appointment information has been received.

## 2012-12-27 ENCOUNTER — Ambulatory Visit (INDEPENDENT_AMBULATORY_CARE_PROVIDER_SITE_OTHER): Payer: 59 | Admitting: General Surgery

## 2012-12-27 ENCOUNTER — Encounter (INDEPENDENT_AMBULATORY_CARE_PROVIDER_SITE_OTHER): Payer: Self-pay | Admitting: General Surgery

## 2012-12-27 VITALS — BP 130/78 | HR 71 | Temp 98.3°F | Resp 16 | Ht 68.0 in | Wt 197.6 lb

## 2012-12-27 DIAGNOSIS — K432 Incisional hernia without obstruction or gangrene: Secondary | ICD-10-CM

## 2012-12-27 NOTE — Progress Notes (Signed)
Patient ID: Jeffrey [name], male   DOB: 12-26-1950, 62 y.o.   MRN: [id number] History: This patient underwent laparoscopic repair of ventral incisional hernia with mesh on 10/22/2012. He returns for a final visit. He is doing well. Has some intermittent soreness which is getting better. Basically he is pleased with his progress. He is ready to get back to normal activities  Exam: Patient looks well. No distress Abdomen soft. Examined standing. Hernia repair intact. No significant tenderness. No trigger points.  Assessment:  ventral incisional hernia, recovering uneventfully following laparoscopic repair with mesh  Plan: Okay to gradually return to normal physical activities without restriction. We discussed this at length. Return to see me if further problems arise.   Angelia Mould. Derrell Lolling, M.D., [facility] General and Minimally invasive Surgery Breast and Colorectal Surgery Office:   [phone] Pager:   [phone]

## 2012-12-27 NOTE — Patient Instructions (Signed)
You have recovered from your laparoscopic ventral hernia repair without any obvious complications.  The intermittent soreness that you have is typical, and should slowly improve over the next 10-12 weeks.  You may slowly return to normal physical activities and exercise. Be sure to stretch well before and after exercise.  Return to see Dr. Derrell Lolling in further problems arise.

## 2013-05-17 NOTE — Progress Notes (Unsigned)
Per RMR- pt needs ifobt. Put one in the mail to the pt, tried to call pt- LMOM informed him that I was mailing ifobt to him and to call me if he had any questions.

## 2013-05-23 ENCOUNTER — Ambulatory Visit (INDEPENDENT_AMBULATORY_CARE_PROVIDER_SITE_OTHER): Payer: 59 | Admitting: Internal Medicine

## 2013-05-23 DIAGNOSIS — Z139 Encounter for screening, unspecified: Secondary | ICD-10-CM

## 2013-05-24 NOTE — Progress Notes (Signed)
Pt's iFOBT was negative. LMOM for pt that the test was negative. ( RMR is aware)

## 2013-05-24 NOTE — Progress Notes (Signed)
Pt called and was informed of the results.  

## 2013-08-24 ENCOUNTER — Telehealth: Payer: Self-pay

## 2013-08-24 MED ORDER — OMEPRAZOLE-SODIUM BICARBONATE 40-1100 MG PO CAPS: 1.0000 | ORAL_CAPSULE | Freq: Every day | ORAL | Status: DC

## 2013-08-24 NOTE — Telephone Encounter (Signed)
rx sent to pharmacy

## 2013-08-24 NOTE — Telephone Encounter (Signed)
Pt is aware.  

## 2013-08-24 NOTE — Telephone Encounter (Signed)
Message copied by Myra Rude on Wed Aug 24, 2013  8:45 AM ------      Message from: Corbin Ade      Created: Tue Aug 23, 2013  2:22 PM       Please call in Zegerid 40 mg capsule  #30 with 11 refills - take one daily as needed;   [facility]  pharmacy and let patient know when this has been done. thanks. ------

## 2013-12-20 ENCOUNTER — Ambulatory Visit (HOSPITAL_COMMUNITY)
Admission: RE | Admit: 2013-12-20 | Discharge: 2013-12-20 | Disposition: A | Discharge status: Home / Self Care | Payer: 59 | Source: Ambulatory Visit | Attending: Orthopedic Surgery | Admitting: Orthopedic Surgery

## 2013-12-20 DIAGNOSIS — M25579 Pain in unspecified ankle and joints of unspecified foot: Secondary | ICD-10-CM | POA: Insufficient documentation

## 2013-12-20 DIAGNOSIS — IMO0001 Reserved for inherently not codable concepts without codable children: Secondary | ICD-10-CM | POA: Insufficient documentation

## 2013-12-20 DIAGNOSIS — M7750 Other enthesopathy of unspecified foot: Secondary | ICD-10-CM | POA: Insufficient documentation

## 2013-12-20 DIAGNOSIS — R269 Unspecified abnormalities of gait and mobility: Secondary | ICD-10-CM | POA: Insufficient documentation

## 2013-12-20 DIAGNOSIS — M7672 Peroneal tendinitis, left leg: Secondary | ICD-10-CM | POA: Insufficient documentation

## 2013-12-20 NOTE — Evaluation (Signed)
Physical Therapy Evaluation  Patient Details  Name: Jeffrey [name] MRN: [id number] Date of Birth: [birth year]  Today's Date: 12/20/2013 Time: 1345-1430 PT Time Calculation (min): 45 min   Charges PT eval  1415 - 1430 iontophoresis  + set up and education           Visit#: 1 of 12  Re-eval: 01/19/14 Assessment Diagnosis: peroneal tendonitis left foot  Next MD Visit: Dr. Doran Durand  Prior Therapy: no   Authorization: united     Authorization Time Period:    Authorization Visit#: 1 of 12   Past Medical History:  Past Medical History  Diagnosis Date  . Hypercholesteremia   . Cancer     skin cancer-chest and neck  . GERD (gastroesophageal reflux disease)   . Ventral hernia   . Abdominal distension   . Abdominal pain    Past Surgical History:  Past Surgical History  Procedure Laterality Date  . Knee arthroscopy      with bone removal-right leg- 2 yrs ago  . Hernia repair      umbilical hernia [id number]-1-2 yrs ago  . Cystectomy      right thumb 2o yrs  . Mass excision  12/11/2011    Procedure: EXCISION MASS;  Surgeon: Marcheta Grammes, DPM;  Location: [facility];  Service: Orthopedics;  Laterality: Left;  Excision of plantar fibroma  . Cystectomy    . Laparotomy  03/30/2012    Procedure: EXPLORATORY LAPAROTOMY;  Surgeon: Donato Heinz, MD;  Location: [facility];  Service: General;  Laterality: N/A;  . Bowel resection  03/30/2012    Procedure: SMALL BOWEL RESECTION;  Surgeon: Donato Heinz, MD;  Location: [facility];  Service: General;  Laterality: N/A;  . Incisional hernia repair  10/22/2012    Procedure: LAPAROSCOPIC INCISIONAL HERNIA;  Surgeon: Adin Hector, MD;  Location: [facility];  Service: General;  Laterality: N/A;  incarcerated  . Insertion of mesh  10/22/2012    Procedure: INSERTION OF MESH;  Surgeon: Adin Hector, MD;  Location: [facility];  Service: General;  Laterality: N/A;   Subjective Symptoms/Limitations Symptoms: c/o of unknown cause of sharp left lateral foot pain  present and worsening overs past year, no prior treatment excpet intermittent crutch use , also numbness 5th, symptoms aggravated with walking and standing, feel better with rest and elevation  Pertinent History: plantar fibroma removed 2012 , hernia surgeries , right knee surgery 2 years ago  Limitations: Standing;Walking How long can you sit comfortably?: comfortable in sitting   How long can you walk comfortably?: 30+ minutes but with pain  Special Tests: states x rays negative  Patient Stated Goals: eliminate pain  Pain Assessment Currently in Pain?: Yes Pain Score: 5  Pain Location: Foot Pain Orientation: Left Pain Type: Chronic pain Pain Onset: More than a month ago Pain Frequency: Intermittent Pain Relieving Factors: rest  Effect of Pain on Daily Activities: pain with walking and weight bearing   Balance Screening Has the patient fallen in the past 6 months: No  Prior Functioning : pain free gait one year ago  Prior Function Level of Independence: Independent with basic ADLs Vocation: Full time employment 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  Leisure: Hobbies-yes (Comment) Comments: working out   Cognition/Observation Observation/Other Assessments Observations: no swelling noted , Full weight bearing with walking,   Sensation/Coordination/Flexibility/Functional Tests Sensation Light Touch: Appears Intact Additional Comments: decreased sharp / dull awarness left foot along lateral aspect  Functional Tests Functional Tests: FOTO 48  iniitial score  Functional Tests: left SLS 10 seconds but with pain, left heel raise with pain   Assessment Observation; low longitudinal left and right arches in weight bearing  LLE AROM (degrees) Left Ankle Dorsiflexion: 15 Left Ankle Plantar Flexion: 25 Left Ankle Inversion: 30 Left Ankle Eversion: 15 LLE Strength Left Ankle Dorsiflexion: 5/5 Left Ankle Plantar Flexion: 4/5 Left Ankle Inversion: 5/5 Left Ankle  Eversion: 4/5 Palpation Palpation: tender on lateral body of left  5th metatarsal and plantar aspect of body of left 5th metarsals   Exercise/Treatment    Modalities Modalities: Iontophoresis Iontophoresis Type of Iontophoresis: Dexamethasone Location: lateral foot body of 5th metatarsal  Dose: 40 mA  Time: 10 min   Physical Therapy Assessment and Plan PT Assessment and Plan Clinical Impression Statement: 63 year old male referred for left peroneal tendonitis, patient presents with symptoms consistnet with dx in which he has lateral and dorsal lateral left foot pain during weight bearing acitivities and tendeness at lateral body of left metatarsal at site onf peroneal brevis. He can benefit from skilled outpatient therapy to reach goals. Ionto started today for pain per MD orders and no adverse affects noted  Pt will benefit from skilled therapeutic intervention in order to improve on the following deficits: Abnormal gait per pain ;Pain left foot lateral aspect ;Decreased mobility/ walking tolerance  per pain  Rehab Potential: Good PT Frequency: Min 3X/week PT Duration: 4 weeks PT Treatment/Interventions: Functional mobility training;Therapeutic activities;Therapeutic exercise;Manual techniques;Patient/family education;Neuromuscular re-education;Modalities PT Plan: ionto with dex as ordered on script, Ultrasound/e stim prn, ice, education, ROM, manual  technigies, taping, footwear education     Goals PT Short Term Goals Time to Complete Short Term Goals: 2 weeks PT Short Term Goal 1: reports pain reduced 25-50% with daily routine with rehab and use of iontophorisis treatment in clinic  PT Short Term Goal 2: patient to report no increase pain or difficulty walking one to two blocks  ( 5-10 minutes)  PT Long Term Goals Time to Complete Long Term Goals: 4 weeks PT Long Term Goal 1: pain at worst less than 3/10 left foot for reduced daily pain  PT Long Term Goal 2: minimal tenderness to  no tenderness with palpation along 5th left metarsal to indicate healing  Long Term Goal 3: standing acitivities greater than 15 min without 5th toe parasthsia or pain graeter than  1-2 /10  Long Term Goal 4: left single leg  stance 5 seconds without pain symptoms   Problem List Patient Active Problem List   Diagnosis Date Noted  . Peroneal tendonitis of left lower extremity 12/20/2013  . Incisional hernia 10/04/2012  . Abdominal pain 03/30/2012  . Perforated bowel 03/30/2012  . Leukocytosis 03/30/2012  . Hyperlipidemia 03/30/2012  . GERD (gastroesophageal reflux disease) 03/30/2012    PT - End of Session Activity Tolerance: Patient tolerated treatment well PT Plan of Care PT Patient Instructions: ice 15 min at end of day, rest , avoid walking through pain, crutch use one side of pain levels are present with walking  Consulted and Agree with Plan of Care: Patient  GP Functional Assessment Tool Used: foto initial score 48 , lintations 52%   Kree Armato 12/20/2013, 4:52 PM  Physician Documentation Your signature is required to indicate approval of the treatment plan as stated above.  Please sign and either send electronically or make a copy of this report for your files and return this physician signed original.   Please mark one 1.__approve of plan  2.  ___approve of plan with the following conditions.   ______________________________                                                          _____________________ Physician Signature                                                                                                             Date

## 2013-12-22 ENCOUNTER — Ambulatory Visit (HOSPITAL_COMMUNITY)
Admission: RE | Admit: 2013-12-22 | Discharge: 2013-12-22 | Disposition: A | Discharge status: Home / Self Care | Payer: 59 | Source: Ambulatory Visit | Attending: Internal Medicine | Admitting: Internal Medicine

## 2013-12-22 DIAGNOSIS — M7672 Peroneal tendinitis, left leg: Secondary | ICD-10-CM

## 2013-12-22 NOTE — Progress Notes (Signed)
Physical Therapy Treatment Patient Details  Name: Jeffrey [name] MRN: [id number] Date of Birth: May 28, 1951  Today's Date: 12/22/2013 Time: 0827-0915 PT Time Calculation (min): 48 min Charge: TE 0827-0900, Ionto with dex x 10 min  Visit#: 2 of 12  Re-eval: 01/19/14    Authorization: united   Authorization Time Period:    Authorization Visit#: 2 of 12   Subjective: Symptoms/Limitations Symptoms: Pt stated foot pain same as last session.  Reported no change with ionto. Pain Assessment Currently in Pain?: Yes Pain Score: 3  Pain Location: Foot Pain Orientation: Left  Objective:   Exercise/Treatments Ankle Stretches Soleus Stretch: 3 reps;30 seconds Gastroc Stretch: 3 reps;30 seconds Ankle Exercises - Seated Towel Crunch: 1 rep Towel Inversion/Eversion: 3 reps Heel Raises: 15 reps Toe Raise: 15 reps BAPS: Sitting;Level 2;10 reps Other Seated Ankle Exercises: green theraband 15x each direction    Modalities Modalities: Iontophoresis Iontophoresis Type of Iontophoresis: Dexamethasone Location: lateral foot body of 5th metatarsal  Dose: 40 mA  Time: 10 min   Physical Therapy Assessment and Plan PT Assessment and Plan Clinical Impression Statement: Began POC for ankle strengthening and stretches to improve flexibilty.  Pt with good coordination noted with seated BAPS board.  Pt given theraband and worksheet for ankle strengthening and stretches, pt able to demonstrate appropriate techniques with all HEP exercises without difficulty.  Reports of numbness on lateral Lt foot through sessoin, pt stated increased feeling with iontophoresis and stated pain free at end of session. PT Plan: ionto with dex as ordered on script, Ultrasound/e stim prn, ice, education, ROM, manual  technigies, taping, footwear education  Next session begin standing heel/toe raises and balance activtiies.  Increased difficulty with BAPS board.    Goals PT Short Term Goals Time to Complete Short Term  Goals: 2 weeks PT Short Term Goal 1: reports pain reduced 25-50% with daily routine with rehab and use of iontophorisis treatment in clinic  PT Short Term Goal 1 - Progress: Progressing toward goal PT Short Term Goal 2: patient to report no increase pain or difficulty walking one to two blocks  ( 5-10 minutes)  PT Long Term Goals Time to Complete Long Term Goals: 4 weeks PT Long Term Goal 1: pain at worst less than 3/10 left foot for reduced daily pain  PT Long Term Goal 2: minimal tenderness to no tenderness with palpation along 5th left metarsal to indicate healing  Long Term Goal 3: standing acitivities greater than 15 min without 5th toe parasthsia or pain graeter tahn 1-2 /10  Long Term Goal 4: left single letg stance 5 seconds without pain symptoms   Problem List Patient Active Problem List   Diagnosis Date Noted  . Peroneal tendonitis of left lower extremity 12/20/2013  . Incisional hernia 10/04/2012  . Abdominal pain 03/30/2012  . Perforated bowel 03/30/2012  . Leukocytosis 03/30/2012  . Hyperlipidemia 03/30/2012  . GERD (gastroesophageal reflux disease) 03/30/2012    PT - End of Session Activity Tolerance: Patient tolerated treatment well General Behavior During Therapy: WFL for tasks assessed/performed PT Plan of Care PT Home Exercise Plan: Given blue theraband and stretches HEP  GP    Aldona Lento 12/22/2013, 9:27 AM

## 2013-12-26 ENCOUNTER — Telehealth: Payer: Self-pay | Admitting: Internal Medicine

## 2013-12-26 NOTE — Telephone Encounter (Signed)
RMR called the office and said that we could triage this patient for a colonoscopy and he needed to be set up between now and first of next month.

## 2013-12-27 ENCOUNTER — Ambulatory Visit (HOSPITAL_COMMUNITY)
Admission: RE | Admit: 2013-12-27 | Discharge: 2013-12-27 | Disposition: A | Discharge status: Home / Self Care | Payer: 59 | Source: Ambulatory Visit | Attending: Internal Medicine | Admitting: Internal Medicine

## 2013-12-27 DIAGNOSIS — M7672 Peroneal tendinitis, left leg: Secondary | ICD-10-CM

## 2013-12-27 NOTE — Progress Notes (Addendum)
Physical Therapy Treatment Patient Details  Name: Jeffrey [name] MRN: [id number] Date of Birth: 04/08/1951  Today's Date: 12/27/2013 Time: 0947-0962 PT Time Calculation (min): 71 min Charge: TE 8366-2947, MLYYT 0354-6568  Visit#: 3 of 12  Re-eval: 01/19/14 Assessment Diagnosis: peroneal tendonitis left foot  Next MD Visit: Dr. Doran Durand  Prior Therapy: no   Authorization: united   Authorization Time Period:    Authorization Visit#: 3 of 12   Subjective: Symptoms/Limitations Symptoms: Pt stated ankle tenderness, reported he can tell something is happening to ankle. Pain Assessment Currently in Pain?: Yes Pain Score: 4  Pain Location: Foot Pain Orientation: Left  Objective:   Exercise/Treatments Ankle Stretches Slant Board Stretch: 3 reps;30 seconds;Limitations Slant Board Stretch Limitations: gastrocnemius and soleus 3x 30" each Ankle Exercises - Standing SLS: Lt 33" Heel Raises: 15 reps Toe Raise: 15 reps Ankle Exercises - Seated Towel Crunch: 2 reps Towel Inversion/Eversion: 3 reps BAPS: Sitting;Level 3;10 reps;Limitations BAPS Limitations: A/P, R/L, CW, CCW  Modalities  Modalities: Iontophoresis  Iontophoresis  Type of Iontophoresis: Dexamethasone  Location: lateral foot body of 5th metatarsal  Dose: 40 mA  Time: 10 min    Physical Therapy Assessment and Plan PT Assessment and Plan Clinical Impression Statement: Progressed to standing exercises for ankle strengthening, pt able to complete all exercises with min cueing required.  Pt with facilication initially required for coordination on BAPS.  Positive results wtih 3rd treatment of ionto. PT Plan: ionto with dex as ordered on script, Ultrasound/e stim prn, ice, education, ROM, manual  technigies, taping, footwear education  Increased difficulty with BAPS board. Begin SLS reaching activities and vector stance to improve ankle stability.      Goals PT Short Term Goals Time to Complete Short Term Goals: 2  weeks PT Short Term Goal 1: reports pain reduced 25-50% with daily routine with rehab and use of iontophorisis treatment in clinic  PT Short Term Goal 1 - Progress: Progressing toward goal PT Short Term Goal 2: patient to report no increase pain or difficulty walking one to two blocks  ( 5-10 minutes)  PT Long Term Goals Time to Complete Long Term Goals: 4 weeks PT Long Term Goal 1: pain at worst less than 3/10 left foot for reduced daily pain  PT Long Term Goal 2: minimal tenderness to no tenderness with palpation along 5th left metarsal to indicate healing  Long Term Goal 3: standing acitivities greater than 15 min without 5th toe parasthsia or pain graeter tahn 1-2 /10  Long Term Goal 4: left single letg stance 5 seconds without pain symptoms  Long Term Goal 4 Progress: Progressing toward goal  Problem List Patient Active Problem List   Diagnosis Date Noted  . Peroneal tendonitis of left lower extremity 12/20/2013  . Incisional hernia 10/04/2012  . Abdominal pain 03/30/2012  . Perforated bowel 03/30/2012  . Leukocytosis 03/30/2012  . Hyperlipidemia 03/30/2012  . GERD (gastroesophageal reflux disease) 03/30/2012    PT - End of Session Activity Tolerance: Patient tolerated treatment well General Behavior During Therapy: [facility] for tasks assessed/performed  GP    Aldona Lento 12/27/2013, 9:32 AM

## 2013-12-28 ENCOUNTER — Other Ambulatory Visit: Payer: Self-pay

## 2013-12-28 DIAGNOSIS — Z1211 Encounter for screening for malignant neoplasm of colon: Secondary | ICD-10-CM

## 2013-12-28 MED ORDER — PEG-KCL-NACL-NASULF-NA ASC-C 100 G PO SOLR: 1.0000 | ORAL | Status: DC

## 2013-12-28 NOTE — Telephone Encounter (Signed)
Gastroenterology Pre-Procedure Review  Request Date: 12/28/2013 Requesting Physician: Dr. Gala Romney   PATIENT REVIEW QUESTIONS: The patient responded to the following health history questions as indicated:    1. Diabetes Melitis: no 2. Joint replacements in the past 12 months: no 3. Major health problems in the past 3 months: no 4. Has an artificial valve or MVP: no 5. Has a defibrillator: no 6. Has been advised in past to take antibiotics in advance of a procedure like teeth cleaning: no    MEDICATIONS & ALLERGIES:    Patient reports the following regarding taking any blood thinners:   Plavix? no Aspirin? YES Coumadin? no  Patient confirms/reports the following medications:  Current Outpatient Prescriptions  Medication Sig Dispense Refill  . aspirin EC 81 MG tablet Take 81 mg by mouth at bedtime.       . fluticasone (FLONASE) 50 MCG/ACT nasal spray Place 1 spray into the nose daily as needed. Allergies      . Multiple Vitamin (MULTIVITAMIN WITH MINERALS) TABS Take 1 tablet by mouth daily.      Marland Kitchen omeprazole-sodium bicarbonate (ZEGERID) 40-1100 MG per capsule Take 1 capsule by mouth daily. Takes only occasionally      . rosuvastatin (CRESTOR) 10 MG tablet Take 10 mg by mouth at bedtime.       . docusate sodium (COLACE) 100 MG capsule Take 100 mg by mouth daily as needed. For constipation      . Krill Oil Ultra Strength 1500 MG CAPS Take by mouth daily.      . naproxen (NAPROSYN) 500 MG tablet Take 1 tablet (500 mg total) by mouth 2 (two) times daily with a meal.  40 tablet  2   No current facility-administered medications for this visit.    Patient confirms/reports the following allergies:  No Known Allergies  No orders of the defined types were placed in this encounter.    AUTHORIZATION INFORMATION Primary Insurance:   ID #:  Group #:  Pre-Cert / Auth required:  Pre-Cert / Auth #:   Secondary Insurance:   ID #: Group #:  Pre-Cert / Auth required:  Pre-Cert / Auth #:    SCHEDULE INFORMATION: Procedure has been scheduled as follows:  Date:01/10/2014                Time:  1:30 PM Location: [facility] Short Stay  This Gastroenterology Pre-Precedure Review Form is being routed to the following provider(s): R. Garfield Cornea, MD

## 2013-12-28 NOTE — Telephone Encounter (Signed)
Okay to schedule; standard splint movie prep regimen

## 2013-12-28 NOTE — Telephone Encounter (Signed)
Rx sent to the pharmacy and instructions mailed to pt.  

## 2013-12-29 ENCOUNTER — Ambulatory Visit (HOSPITAL_COMMUNITY)
Admission: RE | Admit: 2013-12-29 | Discharge: 2013-12-29 | Disposition: A | Discharge status: Home / Self Care | Payer: 59 | Source: Ambulatory Visit | Attending: Internal Medicine | Admitting: Internal Medicine

## 2013-12-29 ENCOUNTER — Encounter (HOSPITAL_COMMUNITY): Payer: Self-pay | Admitting: Pharmacist

## 2013-12-29 DIAGNOSIS — M7672 Peroneal tendinitis, left leg: Secondary | ICD-10-CM

## 2013-12-29 NOTE — Progress Notes (Signed)
Physical Therapy Treatment Patient Details  Name: Jeffrey [name] MRN: [id number] Date of Birth: 24-Sep-1951  Today's Date: 12/29/2013 Time: 1517-6160 PT Time Calculation (min): 11 min Charge: TE 7371-0626, Manual [phone], ionto [phone]  Visit#: 4 of 12  Re-eval: 01/19/14 Assessment Diagnosis: peroneal tendonitis left foot  Next MD Visit: Dr. Doran Durand  Prior Therapy: no   Authorization: united   Authorization Time Period:    Authorization Visit#: 4 of 12   Subjective: Symptoms/Limitations Symptoms: Pt reported he feels positive changes happending Lt ankle, minor pain scale 1/10. Pain Assessment Currently in Pain?: Yes Pain Score: 1  Pain Location: Foot Pain Orientation: Left  Objective:   Exercise/Treatments Ankle Stretches Soleus Stretch: 2 reps;30 seconds Gastroc Stretch: 2 reps;30 seconds Other Stretch: peronal stretch (gastroc stretch in anterior lateral lunge position) Ankle Exercises - Standing Vector Stance: Left;3 reps;5 seconds SLS: Lt 36" Heel Raises: 15 reps Toe Raise: 15 reps Ankle Exercises - Seated Towel Crunch: Weights;3 reps Towel Crunch Weights (lbs): 1 Towel Inversion/Eversion: 4 reps;Weights Towel Inversion/Eversion Weights (lbs): 1 Marble Pickup: 10 marbles BAPS: Sitting;Level 3;10 reps;Limitations BAPS Limitations: A/P, R/L, CW, CCW  Modalities Modalities: Iontophoresis Manual Therapy Manual Therapy: Myofascial release Myofascial Release: MFR to 5th metatarsal region to reduce fascial restrictions Iontophoresis Type of Iontophoresis: Dexamethasone Location: lateral foot body of 5th metatarsal  Dose: 40 mA  Time: 10 min   Physical Therapy Assessment and Plan PT Assessment and Plan Clinical Impression Statement: Progressed ankle stability exercises for strengthening, pt with improved coordination and balance.  Added intrinsic musculature strengthening exercises with no difficutly or c/o increased pain.  Manual techniques complete to  reduce fascial restrictions 5th metatarsal region.  Added peronal stretches to improve flexibilty.  Ended session with 4th treatment of ionto for pain control.   PT Plan: ionto with dex as ordered on script, Ultrasound/e stim prn, ice, education, ROM, manual  technigies, taping, footwear education  Increased difficulty with BAPS board.     Goals PT Short Term Goals Time to Complete Short Term Goals: 2 weeks PT Short Term Goal 1: reports pain reduced 25-50% with daily routine with rehab and use of iontophorisis treatment in clinic  PT Short Term Goal 1 - Progress: Progressing toward goal PT Short Term Goal 2: patient to report no increase pain or difficulty walking one to two blocks  ( 5-10 minutes)  PT Long Term Goals Time to Complete Long Term Goals: 4 weeks PT Long Term Goal 1: pain at worst less than 3/10 left foot for reduced daily pain  PT Long Term Goal 2: minimal tenderness to no tenderness with palpation along 5th left metarsal to indicate healing  PT Long Term Goal 2 - Progress: Progressing toward goal Long Term Goal 3: standing acitivities greater than 15 min without 5th toe parasthsia or pain graeter tahn 1-2 /10  Long Term Goal 4: left single letg stance 5 seconds without pain symptoms  Long Term Goal 4 Progress: Progressing toward goal  Problem List Patient Active Problem List   Diagnosis Date Noted  . Peroneal tendonitis of left lower extremity 12/20/2013  . Incisional hernia 10/04/2012  . Abdominal pain 03/30/2012  . Perforated bowel 03/30/2012  . Leukocytosis 03/30/2012  . Hyperlipidemia 03/30/2012  . GERD (gastroesophageal reflux disease) 03/30/2012    PT - End of Session Activity Tolerance: Patient tolerated treatment well General Behavior During Therapy: [facility] for tasks assessed/performed  GP    Aldona Lento 12/29/2013, 9:31 AM

## 2014-01-03 ENCOUNTER — Ambulatory Visit (HOSPITAL_COMMUNITY)
Admission: RE | Admit: 2014-01-03 | Discharge: 2014-01-03 | Disposition: A | Discharge status: Home / Self Care | Payer: 59 | Source: Ambulatory Visit | Attending: Internal Medicine | Admitting: Internal Medicine

## 2014-01-03 NOTE — Progress Notes (Signed)
Physical Therapy Treatment Patient Details  Name: Jeffrey [name] MRN: [id number] Date of Birth: [birth year]  Today's Date: 01/03/2014 Time: 0850-0950 PT Time Calculation (min): 60 min Charges: Therex x 03'([phone]) Manual x [phone]) Ionto x 51'([phone])  Visit#: 4 of 12  Re-eval: 01/19/14  Authorization: united   Authorization Visit#: 4 of 12   Subjective: Symptoms/Limitations Symptoms: Pt states that he had some soreness yesterday from cutting limbs over the weekend. He states his foot is feeling better overall.  Pain Assessment Pain Score: 2  Pain Location: Foot Pain Orientation: Left   Exercise/Treatments Ankle Stretches Slant Board Stretch: 3 reps;30 seconds;Limitations Slant Board Stretch Limitations: gastrocnemius and soleus 3x 30" each Ankle Exercises - Standing Vector Stance: 5 reps;5 seconds;Left SLS: Lt 60" Rocker Board: 2 minutes;Limitations Rocker Board Limitations: right/left anterior/posterior Heel Raises: 15 reps Toe Raise: 15 reps Ankle Exercises - Seated BAPS: Sitting;Level 3;10 reps;Limitations BAPS Limitations: A/P, R/L, CW, CCW   Modalities Modalities: Iontophoresis Manual Therapy Manual Therapy: Myofascial release Iontophoresis Type of Iontophoresis: Dexamethasone Location: lateral foot body of 5th metatarsal  Dose: 2 cc 31mA Time: 12 minutes  Physical Therapy Assessment and Plan PT Assessment and Plan Clinical Impression Statement: Pt completes therex well after initial cueing and demo. Improve ankle strategy noted with SLS activities. Began rocker board to improve ankle strength and control. Continues with iontophoresis to decrease inflammation and pain. PT Plan: Continue iontophoresis with dex as ordered on script, Ultrasound/electrical stimulation prn, ice, education, ROM, manual  techniques, taping, footwear education.     Problem List Patient Active Problem List   Diagnosis Date Noted  . Peroneal tendonitis of left lower  extremity 12/20/2013  . Incisional hernia 10/04/2012  . Abdominal pain 03/30/2012  . Perforated bowel 03/30/2012  . Leukocytosis 03/30/2012  . Hyperlipidemia 03/30/2012  . GERD (gastroesophageal reflux disease) 03/30/2012    PT - End of Session Activity Tolerance: Patient tolerated treatment well General Behavior During Therapy: [facility] for tasks assessed/performed  Rachelle Hora, PTA  01/03/2014, 10:11 AM

## 2014-01-04 ENCOUNTER — Telehealth: Payer: Self-pay

## 2014-01-04 NOTE — Telephone Encounter (Signed)
I called [employer] at [phone] and spoke to [facility] who said that a PA IS NOT REQUIRED for a screening colonoscopy.

## 2014-01-05 ENCOUNTER — Ambulatory Visit (HOSPITAL_COMMUNITY): Payer: 59

## 2014-01-09 ENCOUNTER — Ambulatory Visit (HOSPITAL_COMMUNITY)
Admission: RE | Admit: 2014-01-09 | Discharge: 2014-01-09 | Disposition: A | Discharge status: Home / Self Care | Payer: 59 | Source: Ambulatory Visit | Attending: Internal Medicine | Admitting: Internal Medicine

## 2014-01-09 DIAGNOSIS — M7672 Peroneal tendinitis, left leg: Secondary | ICD-10-CM

## 2014-01-09 NOTE — Progress Notes (Signed)
Physical Therapy Treatment Patient Details  Name: Jeffrey [name] MRN: [id number] Date of Birth: [birth year]  Today's Date: 01/09/2014 Time: 1015-1105 PT Time Calculation (min): 50 min Charge : TE 1015-1042, Manual 1042-1052, Ionto [phone]  Visit#: 6 of 12  Re-eval: 01/19/14 Assessment Diagnosis: peroneal tendonitis left foot  Next MD Visit: Dr. Doran Durand  Prior Therapy: no   Authorization: united   Authorization Time Period:    Authorization Visit#: 6 of 12   Subjective: Symptoms/Limitations Symptoms: Pt reports Lt foot pain scale 3/10.  Pt stated he went to a 3 day car show standing and walking for 3-4 hour periods.   Pain Assessment Currently in Pain?: Yes Pain Score: 3  Pain Location: Foot Pain Orientation: Left  Objective:  Exercise/Treatments Ankle Stretches Slant Board Stretch: 3 reps;30 seconds;Limitations Slant Board Stretch Limitations: gastrocnemius and soleus 3x 30" each Ankle Exercises - Standing Vector Stance: 5 reps;5 seconds;Left Rocker Board: 2 minutes;Limitations Rocker Board Limitations: right/left anterior/posterior Heel Raises: 15 reps Toe Raise: 15 reps Ankle Exercises - Seated BAPS: Sitting;Level 3;10 reps;Limitations BAPS Limitations: A/P, R/L, CW, CCW    Modalities Modalities: Iontophoresis Manual Therapy Manual Therapy: Myofascial release Myofascial Release: MFR to 5th metatarsal region to reduce fascial restrictions Iontophoresis Type of Iontophoresis: Dexamethasone Location: lateral foot body of 5th metatarsal  Dose: 2 cc 57mA Time: 12 minutes  Physical Therapy Assessment and Plan PT Assessment and Plan Clinical Impression Statement: Pt completed therex with minimal difficulty, improved coordiantion noted today on BAPS board.  Manaul techniques complete to reduce fascial restrictiions with pain reduction reported following manual.  Continued with iontophoresis for pain and edema control, #6.   PT Plan: Continue iontophoresis with dex  as ordered on script, Ultrasound/electrical stimulation prn, ice, education, ROM, manual  techniques, taping, footwear education.    Goals PT Short Term Goals Time to Complete Short Term Goals: 2 weeks PT Short Term Goal 1: reports pain reduced 25-50% with daily routine with rehab and use of iontophorisis treatment in clinic  PT Short Term Goal 1 - Progress: Progressing toward goal PT Short Term Goal 2: patient to report no increase pain or difficulty walking one to two blocks  ( 5-10 minutes)  PT Long Term Goals Time to Complete Long Term Goals: 4 weeks PT Long Term Goal 1: pain at worst less than 3/10 left foot for reduced daily pain  PT Long Term Goal 1 - Progress: Progressing toward goal PT Long Term Goal 2: minimal tenderness to no tenderness with palpation along 5th left metarsal to indicate healing  PT Long Term Goal 2 - Progress: Progressing toward goal Long Term Goal 3: standing acitivities greater than 15 min without 5th toe parasthsia or pain graeter tahn 1-2 /10  Long Term Goal 4: left single letg stance 5 seconds without pain symptoms  Long Term Goal 4 Progress: Progressing toward goal  Problem List Patient Active Problem List   Diagnosis Date Noted  . Peroneal tendonitis of left lower extremity 12/20/2013  . Incisional hernia 10/04/2012  . Abdominal pain 03/30/2012  . Perforated bowel 03/30/2012  . Leukocytosis 03/30/2012  . Hyperlipidemia 03/30/2012  . GERD (gastroesophageal reflux disease) 03/30/2012    PT - End of Session Activity Tolerance: Patient tolerated treatment well General Behavior During Therapy: [facility] for tasks assessed/performed  GP    Aldona Lento 01/09/2014, 11:10 AM

## 2014-01-10 ENCOUNTER — Ambulatory Visit (HOSPITAL_COMMUNITY)
Admission: RE | Admit: 2014-01-10 | Discharge: 2014-01-10 | Disposition: A | Discharge status: Home / Self Care | Payer: 59 | Source: Ambulatory Visit | Attending: Internal Medicine | Admitting: Internal Medicine

## 2014-01-10 ENCOUNTER — Encounter (HOSPITAL_COMMUNITY)
Admission: RE | Disposition: A | Discharge status: Home / Self Care | Payer: 59 | Source: Ambulatory Visit | Attending: Internal Medicine

## 2014-01-10 ENCOUNTER — Encounter (HOSPITAL_COMMUNITY): Payer: Self-pay | Admitting: *Deleted

## 2014-01-10 ENCOUNTER — Ambulatory Visit (HOSPITAL_COMMUNITY): Payer: 59

## 2014-01-10 DIAGNOSIS — K921 Melena: Secondary | ICD-10-CM | POA: Insufficient documentation

## 2014-01-10 DIAGNOSIS — D126 Benign neoplasm of colon, unspecified: Secondary | ICD-10-CM

## 2014-01-10 DIAGNOSIS — Z7982 Long term (current) use of aspirin: Secondary | ICD-10-CM | POA: Insufficient documentation

## 2014-01-10 DIAGNOSIS — Z1211 Encounter for screening for malignant neoplasm of colon: Secondary | ICD-10-CM

## 2014-01-10 DIAGNOSIS — K648 Other hemorrhoids: Secondary | ICD-10-CM | POA: Insufficient documentation

## 2014-01-10 DIAGNOSIS — E7800 Pure hypercholesterolemia, unspecified: Secondary | ICD-10-CM | POA: Insufficient documentation

## 2014-01-10 HISTORY — PX: COLONOSCOPY: SHX5424

## 2014-01-10 SURGERY — COLONOSCOPY
Anesthesia: Moderate Sedation

## 2014-01-10 MED ORDER — ONDANSETRON HCL 4 MG/2ML IJ SOLN
INTRAMUSCULAR | Status: DC | PRN
  Administered 2014-01-10: 4 mg via INTRAVENOUS

## 2014-01-10 MED ORDER — MEPERIDINE HCL 100 MG/ML IJ SOLN
INTRAMUSCULAR | Status: DC | PRN
Start: 2014-01-10 — End: 2014-01-10
  Administered 2014-01-10: 25 mg via INTRAVENOUS
  Administered 2014-01-10: 50 mg via INTRAVENOUS

## 2014-01-10 MED ORDER — STERILE WATER FOR IRRIGATION IR SOLN
Status: DC | PRN
  Administered 2014-01-10: 14:00:00

## 2014-01-10 MED ORDER — MEPERIDINE HCL 100 MG/ML IJ SOLN
INTRAMUSCULAR | Status: AC
  Filled 2014-01-10: qty 2

## 2014-01-10 MED ORDER — SODIUM CHLORIDE 0.9 % IV SOLN
INTRAVENOUS | Status: DC
  Administered 2014-01-10: 13:00:00 via INTRAVENOUS

## 2014-01-10 MED ORDER — ONDANSETRON HCL 4 MG/2ML IJ SOLN
INTRAMUSCULAR | Status: DC
Start: 2014-01-10 — End: 2014-01-10
  Filled 2014-01-10: qty 2

## 2014-01-10 MED ORDER — MIDAZOLAM HCL 5 MG/5ML IJ SOLN
INTRAMUSCULAR | Status: DC
Start: 2014-01-10 — End: 2014-01-10
  Filled 2014-01-10: qty 10

## 2014-01-10 MED ORDER — MIDAZOLAM HCL 5 MG/5ML IJ SOLN
INTRAMUSCULAR | Status: DC | PRN
  Administered 2014-01-10: 2 mg via INTRAVENOUS
  Administered 2014-01-10 (×3): 1 mg via INTRAVENOUS

## 2014-01-10 NOTE — H&P (Signed)
Primary Care Physician:  Delphina Cahill, MD Primary Gastroenterologist:  Dr. Gala Romney  Pre-Procedure History & Physical: HPI:  Jeffrey [name] is a 63 y.o. male here for further evaluation of hematochezia. Patient reports normal bowel function however recently he did have stool that had some gross blood covering. Hasn't really had any bloody stools since that time. Denies abdominal pain or any upper GI tract symptoms aside from occasional GERD which is well controlled on Zegerid. No family history of colorectal neoplasia. His son recently had a colonic adenoma removed from his colon. Patient's last colonoscopy was about 6 years ago-negative. No family history of colorectal neoplasia.   Past Medical History  Diagnosis Date  . Hypercholesteremia   . Cancer     skin cancer-chest and neck  . GERD (gastroesophageal reflux disease)   . Ventral hernia   . Abdominal distension   . Abdominal pain     Past Surgical History  Procedure Laterality Date  . Knee arthroscopy      with bone removal-right leg- 2 yrs ago  . Hernia repair      umbilical hernia [id number] yrs ago  . Cystectomy      right thumb 2o yrs  . Mass excision  12/11/2011    Procedure: EXCISION MASS;  Surgeon: Marcheta Grammes, DPM;  Location: [facility];  Service: Orthopedics;  Laterality: Left;  Excision of plantar fibroma  . Cystectomy    . Laparotomy  03/30/2012    Procedure: EXPLORATORY LAPAROTOMY;  Surgeon: Donato Heinz, MD;  Location: [facility];  Service: General;  Laterality: N/A;  . Bowel resection  03/30/2012    Procedure: SMALL BOWEL RESECTION;  Surgeon: Donato Heinz, MD;  Location: [facility];  Service: General;  Laterality: N/A;  . Incisional hernia repair  10/22/2012    Procedure: LAPAROSCOPIC INCISIONAL HERNIA;  Surgeon: Adin Hector, MD;  Location: [facility];  Service: General;  Laterality: N/A;  incarcerated  . Insertion of mesh  10/22/2012    Procedure: INSERTION OF MESH;  Surgeon: Adin Hector, MD;  Location: [facility]  [facility];  Service: General;  Laterality: N/A;    Prior to Admission medications   Medication Sig Start Date End Date Taking? Authorizing Provider  aspirin EC 81 MG tablet Take 81 mg by mouth at bedtime.    Yes Historical Provider, MD  fluticasone (FLONASE) 50 MCG/ACT nasal spray Place 1 spray into the nose daily as needed. Allergies   Yes Historical Provider, MD  omeprazole-sodium bicarbonate (ZEGERID) 40-1100 MG per capsule Take 1 capsule by mouth daily as needed (for acid reflux).  08/24/13  Yes Daneil Dolin, MD  peg 3350 powder (MOVIPREP) 100 G SOLR Take 1 kit (200 g total) by mouth as directed. 12/28/13  Yes Daneil Dolin, MD  rosuvastatin (CRESTOR) 10 MG tablet Take 10 mg by mouth at bedtime.    Yes Historical Provider, MD    Allergies as of 12/28/2013  . (No Known Allergies)    Family History  Problem Relation Age of Onset  . Anesthesia problems Neg Hx   . Hypotension Neg Hx   . Malignant hyperthermia Neg Hx   . Pseudochol deficiency Neg Hx   . Cancer Mother     breast  . Stroke Mother     History   Social History  . Marital Status: Married    Spouse Name: N/A    Number of Children: N/A  . Years of Education: N/A   Occupational History  . Not on  file.   Social History Main Topics  . Smoking status: Never Smoker   . Smokeless tobacco: Never Used  . Alcohol Use: No     Comment: a beer on occasion  . Drug Use: No  . Sexual Activity: Yes   Other Topics Concern  . Not on file   Social History Narrative  . No narrative on file    Review of Systems: See HPI, otherwise negative ROS  Physical Exam: BP 137/78  Pulse 88  Temp(Src) 98 F (36.7 C) (Oral)  Resp 18  SpO2 98% General:   Alert,  Well-developed, well-nourished, pleasant and cooperative in NAD Skin:  Intact without significant lesions or rashes. Eyes:  Sclera clear, no icterus.   Conjunctiva pink. Ears:  Normal auditory acuity. Nose:  No deformity, discharge,  or lesions. Mouth:  No deformity or  lesions. Neck:  Supple; no masses or thyromegaly. No significant cervical adenopathy. Lungs:  Clear throughout to auscultation.   No wheezes, crackles, or rhonchi. No acute distress. Heart:  Regular rate and rhythm; no murmurs, clicks, rubs,  or gallops. Abdomen: .  Pulses:  Normal pulses noted. Extremities:  Without clubbing or edema.  Impression/ Plan: Pleasant 63 year old gentleman with recent episode of hematochezia-painless. Bowel function otherwise normal. It has been good 6 years since he last had a colonoscopy.  I suspect benign anorectal source for hematochezia. However, it would be prudent to go ahead and examine his entire lower GI tract. To this, I have offered him a diagnostic colonoscopy today.The risks, benefits, limitations, alternatives and imponderables have been reviewed with the patient. Questions have been answered. All parties are agreeable.

## 2014-01-10 NOTE — Discharge Instructions (Addendum)
Colonoscopy Discharge Instructions  Read the instructions outlined below and refer to this sheet in the next few weeks. These discharge instructions provide you with general information on caring for yourself after you leave the hospital. Your doctor may also give you specific instructions. While your treatment has been planned according to the most current medical practices available, unavoidable complications occasionally occur. If you have any problems or questions after discharge, call Dr. Gala Romney at [phone]. ACTIVITY  You may resume your regular activity, but move at a slower pace for the next 24 hours.   Take frequent rest periods for the next 24 hours.   Walking will help get rid of the air and reduce the bloated feeling in your belly (abdomen).   No driving for 24 hours (because of the medicine (anesthesia) used during the test).    Do not sign any important legal documents or operate any machinery for 24 hours (because of the anesthesia used during the test).  NUTRITION  Drink plenty of fluids.   You may resume your normal diet as instructed by your doctor.   Begin with a light meal and progress to your normal diet. Heavy or fried foods are harder to digest and may make you feel sick to your stomach (nauseated).   Avoid alcoholic beverages for 24 hours or as instructed.  MEDICATIONS  You may resume your normal medications unless your doctor tells you otherwise.  WHAT YOU CAN EXPECT TODAY  Some feelings of bloating in the abdomen.   Passage of more gas than usual.   Spotting of blood in your stool or on the toilet paper.  IF YOU HAD POLYPS REMOVED DURING THE COLONOSCOPY:  No aspirin products for 7 days or as instructed.   No alcohol for 7 days or as instructed.   Eat a soft diet for the next 24 hours.  FINDING OUT THE RESULTS OF YOUR TEST Not all test results are available during your visit. If your test results are not back during the visit, make an appointment  with your caregiver to find out the results. Do not assume everything is normal if you have not heard from your caregiver or the medical facility. It is important for you to follow up on all of your test results.  SEEK IMMEDIATE MEDICAL ATTENTION IF:  You have more than a spotting of blood in your stool.   Your belly is swollen (abdominal distention).   You are nauseated or vomiting.   You have a temperature over 101.   You have abdominal pain or discomfort that is severe or gets worse throughout the day.     Polyp, hemorrhoids and diverticulosis information provided  No specific therapy for hemorrhoids needed at this time unless you have recurrent bleeding  Further recommendations to follow pending review of pathology report      Colon Polyps Polyps are lumps of extra tissue growing inside the body. Polyps can grow in the large intestine (colon). Most colon polyps are noncancerous (benign). However, some colon polyps can become cancerous over time. Polyps that are larger than a pea may be harmful. To be safe, caregivers remove and test all polyps. CAUSES  Polyps form when mutations in the genes cause your cells to grow and divide even though no more tissue is needed. RISK FACTORS There are a number of risk factors that can increase your chances of getting colon polyps. They include: Being older than 50 years. Family history of colon polyps or colon cancer. Long-term colon diseases,  such as colitis or Crohn disease. Being overweight. Smoking. Being inactive. Drinking too much alcohol. SYMPTOMS  Most small polyps do not cause symptoms. If symptoms are present, they may include: Blood in the stool. The stool may look dark red or black. Constipation or diarrhea that lasts longer than 1 week. DIAGNOSIS People often do not know they have polyps until their caregiver finds them during a regular checkup. Your caregiver can use 4 tests to check for polyps: Digital rectal exam. The  caregiver wears gloves and feels inside the rectum. This test would find polyps only in the rectum. Barium enema. The caregiver puts a liquid called barium into your rectum before taking X-rays of your colon. Barium makes your colon look white. Polyps are dark, so they are easy to see in the X-ray pictures. Sigmoidoscopy. A thin, flexible tube (sigmoidoscope) is placed into your rectum. The sigmoidoscope has a light and tiny camera in it. The caregiver uses the sigmoidoscope to look at the last third of your colon. Colonoscopy. This test is like sigmoidoscopy, but the caregiver looks at the entire colon. This is the most common method for finding and removing polyps. TREATMENT  Any polyps will be removed during a sigmoidoscopy or colonoscopy. The polyps are then tested for cancer. PREVENTION  To help lower your risk of getting more colon polyps: Eat plenty of fruits and vegetables. Avoid eating fatty foods. Do not smoke. Avoid drinking alcohol. Exercise every day. Lose weight if recommended by your caregiver. Eat plenty of calcium and folate. Foods that are rich in calcium include milk, cheese, and broccoli. Foods that are rich in folate include chickpeas, kidney beans, and spinach. HOME CARE INSTRUCTIONS Keep all follow-up appointments as directed by your caregiver. You may need periodic exams to check for polyps. SEEK MEDICAL CARE IF: You notice bleeding during a bowel movement. Document Released: 07/02/2004 Document Revised: 12/29/2011 Document Reviewed: 12/16/2011 [name].     Diverticulosis Diverticulosis is a common condition that develops when small pouches (diverticula) form in the wall of the colon. The risk of diverticulosis increases with age. It happens more often in people who eat a low-fiber diet. Most individuals with diverticulosis have no symptoms. Those individuals with symptoms usually experience abdominal pain, constipation, or  loose stools (diarrhea). HOME CARE INSTRUCTIONS   Increase the amount of fiber in your diet as directed by your caregiver or dietician. This may reduce symptoms of diverticulosis.  Your caregiver may recommend taking a dietary fiber supplement.  Drink at least 6 to 8 glasses of water each day to prevent constipation.  Try not to strain when you have a bowel movement.  Your caregiver may recommend avoiding nuts and seeds to prevent complications, although this is still an uncertain benefit.  Only take over-the-counter or prescription medicines for pain, discomfort, or fever as directed by your caregiver. FOODS WITH HIGH FIBER CONTENT INCLUDE:  Fruits. Apple, peach, pear, tangerine, raisins, prunes.  Vegetables. Brussels sprouts, asparagus, broccoli, cabbage, carrot, cauliflower, romaine lettuce, spinach, summer squash, tomato, winter squash, zucchini.  Starchy Vegetables. Baked beans, kidney beans, lima beans, split peas, lentils, potatoes (with skin).  Grains. Whole wheat bread, brown rice, bran flake cereal, plain oatmeal, white rice, shredded wheat, bran muffins. SEEK IMMEDIATE MEDICAL CARE IF:   You develop increasing pain or severe bloating.  You have an oral temperature above 102 F (38.9 C), not controlled by medicine.  You develop vomiting or bowel movements that are bloody or black. Document Released:  07/03/2004 Document Revised: 12/29/2011 Document Reviewed: 03/06/2010 [employer] Patient Information 2014 [place].        Hemorrhoids Hemorrhoids are swollen veins around the rectum or anus. There are two types of hemorrhoids:   Internal hemorrhoids. These occur in the veins just inside the rectum. They may poke through to the outside and become irritated and painful.  External hemorrhoids. These occur in the veins outside the anus and can be felt as a painful swelling or hard lump near the anus. CAUSES  Pregnancy.   Obesity.   Constipation or  diarrhea.   Straining to have a bowel movement.   Sitting for long periods on the toilet.  Heavy lifting or other activity that caused you to strain.  Anal intercourse. SYMPTOMS   Pain.   Anal itching or irritation.   Rectal bleeding.   Fecal leakage.   Anal swelling.   One or more lumps around the anus.  DIAGNOSIS  Your caregiver may be able to diagnose hemorrhoids by visual examination. Other examinations or tests that may be performed include:   Examination of the rectal area with a gloved hand (digital rectal exam).   Examination of anal canal using a small tube (scope).   A blood test if you have lost a significant amount of blood.  A test to look inside the colon (sigmoidoscopy or colonoscopy). TREATMENT Most hemorrhoids can be treated at home. However, if symptoms do not seem to be getting better or if you have a lot of rectal bleeding, your caregiver may perform a procedure to help make the hemorrhoids get smaller or remove them completely. Possible treatments include:   Placing a rubber band at the base of the hemorrhoid to cut off the circulation (rubber band ligation).   Injecting a chemical to shrink the hemorrhoid (sclerotherapy).   Using a tool to burn the hemorrhoid (infrared light therapy).   Surgically removing the hemorrhoid (hemorrhoidectomy).   Stapling the hemorrhoid to block blood flow to the tissue (hemorrhoid stapling).  HOME CARE INSTRUCTIONS   Eat foods with fiber, such as whole grains, beans, nuts, fruits, and vegetables. Ask your doctor about taking products with added fiber in them (fibersupplements).  Increase fluid intake. Drink enough water and fluids to keep your urine clear or pale yellow.   Exercise regularly.   Go to the bathroom when you have the urge to have a bowel movement. Do not wait.   Avoid straining to have bowel movements.   Keep the anal area dry and clean. Use wet toilet paper or moist  towelettes after a bowel movement.   Medicated creams and suppositories may be used or applied as directed.   Only take over-the-counter or prescription medicines as directed by your caregiver.   Take warm sitz baths for 15 20 minutes, 3 4 times a day to ease pain and discomfort.   Place ice packs on the hemorrhoids if they are tender and swollen. Using ice packs between sitz baths may be helpful.   Put ice in a plastic bag.   Place a towel between your skin and the bag.   Leave the ice on for 15 20 minutes, 3 4 times a day.   Do not use a donut-shaped pillow or sit on the toilet for long periods. This increases blood pooling and pain.  SEEK MEDICAL CARE IF:  You have increasing pain and swelling that is not controlled by treatment or medicine.  You have uncontrolled bleeding.  You have difficulty or you are  unable to have a bowel movement.  You have pain or inflammation outside the area of the hemorrhoids. MAKE SURE YOU:  Understand these instructions.  Will watch your condition.  Will get help right away if you are not doing well or get worse. Document Released: 10/03/2000 Document Revised: 09/22/2012 Document Reviewed: 08/10/2012 [facility] Patient Information 2014 [place].

## 2014-01-10 NOTE — Op Note (Signed)
[facility] [address] [place], [address]   COLONOSCOPY PROCEDURE REPORT  PATIENT: Jeffrey [name], Jeffrey [name]  MR#:         [id number] BIRTHDATE: 1951/06/14 , 63  yrs. old GENDER: Male ENDOSCOPIST: R.  Garfield Cornea, MD FACP FACG REFERRED BY:  Delphina Cahill, M.D. PROCEDURE DATE:  01/10/2014 PROCEDURE:     Ileocolonoscopy with snare polypectomy  INDICATIONS: Paper hematochezia  INFORMED CONSENT:  The risks, benefits, alternatives and imponderables including but not limited to bleeding, perforation as well as the possibility of a missed lesion have been reviewed.  The potential for biopsy, lesion removal, etc. have also been discussed.  Questions have been answered.  All parties agreeable. Please see the history and physical in the medical record for more information.  MEDICATIONS: Versed 5 mg IV and Demerol 75 mg IV in divided doses. Zofran 4 mg IV  DESCRIPTION OF PROCEDURE:  After a digital rectal exam was performed, the EC-[id number] ([id number])  colonoscope was advanced from the anus through the rectum and colon to the area of the cecum, ileocecal valve and appendiceal orifice.  The cecum was deeply intubated.  These structures were well-seen and photographed for the record.  From the level of the cecum and ileocecal valve, the scope was slowly and cautiously withdrawn.  The mucosal surfaces were carefully surveyed utilizing scope tip deflection to facilitate fold flattening as needed.  The scope was pulled down into the rectum where a thorough examination including retroflexion was performed.    FINDINGS:  Adequate preparation. Anal canal hemorrhoids; otherwise normal rectum. Scattered shallow, left-sided diverticula; (1) 3 mm polyp in the mid descending segment; otherwise, the remainder of the colonic mucosa appeared normal. The distal 5 cm of terminal ileal mucosa also appeared normal.  THERAPEUTIC / DIAGNOSTIC MANEUVERS PERFORMED:  The above-mentioned polyp was  cold snare removed.  COMPLICATIONS: None  CECAL WITHDRAWAL TIME:  16 minutes  IMPRESSION:  Colonic diverticulosis. Single colonic polyp-removed as described above. I suspect benign anorectal bleeding related to hemorrhoids. Appears to be self limiting thus far.  RECOMMENDATIONS: Hemorrhoid literature provided. No specific further therapy needed unless he has recurrent symptoms. Followup on pathology.   _______________________________ eSigned:  R. Garfield Cornea, MD FACP [facility] 01/10/2014 2:19 PM   CC:    PATIENT NAME:  [name], [name] MR#: [id number]

## 2014-01-12 ENCOUNTER — Ambulatory Visit (HOSPITAL_COMMUNITY)
Admission: RE | Admit: 2014-01-12 | Discharge: 2014-01-12 | Disposition: A | Discharge status: Home / Self Care | Payer: 59 | Source: Ambulatory Visit | Attending: Internal Medicine | Admitting: Internal Medicine

## 2014-01-12 DIAGNOSIS — M7672 Peroneal tendinitis, left leg: Secondary | ICD-10-CM

## 2014-01-12 NOTE — Progress Notes (Signed)
Physical Therapy Treatment Patient Details  Name: GEOVANNY SARTIN MRN: [id number] Date of Birth: June 03, 1951  Today's Date: 01/12/2014 Time: 8756-4332 PT Time Calculation (min): 45 min Manual 15 min , ionto x 14 min, manual x 15 min  Visit#: 7 of 12  Re-eval: 01/19/14 Assessment Diagnosis: peroneal tendonitis left foot  Next MD Visit: Dr. Doran Durand  Prior Therapy: no   Authorization: united   Authorization Time Period:    Authorization Visit#: 7 of 12   Subjective: Symptoms/Limitations Symptoms: Pt reports Lt foot pain scale 2/10.  Pt stated walking on TM 20 min daily, no pain during last time walking on TM , c/o aches at ngiht .   Pertinent History: plantar fibroma removed 2012 , hernia surgeries , right knee surgery 2 years ago   Precautions/Restrictions     Exercise/Treatments Mobility/Balance        Ankle Stretches Soleus Stretch: 2 reps;30 seconds Gastroc Stretch: 2 reps;30 seconds Slant Board Stretch: 3 reps;30 seconds;Limitations Slant Board Stretch Limitations: gastrocnemius and soleus 3x 30" each Aerobic Exercises   Machines for Strengthening   Ankle Plyometrics   Ankle Exercises - Standing Vector Stance: 5 reps;5 seconds;Left SLS: Lt 60" Rocker Board Limitations: right/left anterior/posterior Heel Raises: 15 reps Toe Raise: 15 reps Other Standing Ankle Exercises: balance pad tandem stance 55mn each direction  Ankle Exercises - Seated   Ankle Exercises - Supine T-Band: red 20x  Ankle Exercises - Sidelying   Modalities Modalities: Iontophoresis Manual Therapy Manual Therapy: Myofascial release Myofascial Release: MFR to 5th metarsal and plantar fascia to reduce fascila restrictions  Iontophoresis Type of Iontophoresis: Dexamethasone Location: lateral foot body of 5th metatarsal  Dose: 2 cc 638mTime: 14 minutes  Physical Therapy Assessment and Plan PT Assessment and Plan Clinical Impression Statement: decreased pain levels and returning to pain  free ambuiation with pain patterns grater at night, decreased tenderness at mid body of 5th mettarsal indicatin healing and good response to all aspects of therapy  PT Plan: Continue iontophoresis with dex as ordered on script, Ultrasound/electrical stimulation prn, ice, education, ROM, manual  techniques, taping, footwear education.    Goals PT Short Term Goals Time to Complete Short Term Goals: 2 weeks PT Short Term Goal 1: reports pain reduced 25-50% with daily routine with rehab and use of iontophorisis treatment in clinic  PT Short Term Goal 2: patient to report no increase pain or difficulty walking one to two blocks  ( 5-10 minutes)  PT Short Term Goal 2 - Progress: Met PT Long Term Goals Time to Complete Long Term Goals: 4 weeks PT Long Term Goal 1: pain at worst less than 3/10 left foot for reduced daily pain  PT Long Term Goal 1 - Progress: Progressing toward goal PT Long Term Goal 2: minimal tenderness to no tenderness with palpation along 5th left metarsal to indicate healing  PT Long Term Goal 2 - Progress: Progressing toward goal Long Term Goal 3: standing acitivities greater than 15 min without 5th toe parasthsia or pain graeter tahn 1-2 /10  Long Term Goal 3 Progress: Progressing toward goal Long Term Goal 4: left single letg stance 5 seconds without pain symptoms  Long Term Goal 4 Progress: Progressing toward goal  Problem List Patient Active Problem List   Diagnosis Date Noted  . Peroneal tendonitis of left lower extremity 12/20/2013  . Incisional hernia 10/04/2012  . Abdominal pain 03/30/2012  . Perforated bowel 03/30/2012  . Leukocytosis 03/30/2012  . Hyperlipidemia 03/30/2012  . GERD (gastroesophageal reflux  disease) 03/30/2012    PT - End of Session Activity Tolerance: Patient tolerated treatment well General Behavior During Therapy: [facility] for tasks assessed/performed PT Plan of Care PT Patient Instructions: tennis ball rolling x 2 min plantar fascia  at home    GP    Maelie Chriswell 01/12/2014, 10:09 AM

## 2014-01-14 ENCOUNTER — Encounter: Payer: Self-pay | Admitting: Internal Medicine

## 2014-01-16 ENCOUNTER — Encounter (HOSPITAL_COMMUNITY): Payer: Self-pay | Admitting: Internal Medicine

## 2014-01-17 ENCOUNTER — Ambulatory Visit (HOSPITAL_COMMUNITY)
Admission: RE | Admit: 2014-01-17 | Discharge: 2014-01-17 | Disposition: A | Discharge status: Home / Self Care | Payer: 59 | Source: Ambulatory Visit | Attending: Internal Medicine | Admitting: Internal Medicine

## 2014-01-17 DIAGNOSIS — M7672 Peroneal tendinitis, left leg: Secondary | ICD-10-CM

## 2014-01-17 NOTE — Progress Notes (Signed)
Physical Therapy Treatment Patient Details  Name: Jeffrey [name] MRN: [id number] Date of Birth: 08-20-1951  Today's Date: 01/17/2014 Time: 3570-1779 PT Time Calculation (min): 5 min Charge : TE [id number], Manual [phone], [place] 12 [phone]  Visit#: 8 of 12  Re-eval: 01/19/14 Assessment Diagnosis: peroneal tendonitis left foot  Next MD Visit: Dr. Doran Durand  Prior Therapy: no   Authorization: united   Authorization Time Period:    Authorization Visit#: 8 of 12   Subjective: Symptoms/Limitations Symptoms: Pt stated he was pain free today, has not been on feet a lot lately. Pt reported he has already done his standing exercises and stretches this morning.   Pain Assessment Currently in Pain?: No/denies  Objective:  Exercise/Treatments Ankle Stretches Slant Board Stretch: 3 reps;30 seconds;Limitations Slant Board Stretch Limitations: gastrocnemius and soleus 3x 30" each Ankle Exercises - Standing Rocker Board: 2 minutes;Limitations Rocker Board Limitations: right/left anterior/posterior no HHA Balance Beam: Tandem stance, tandem gait and retro gait 1RT Ankle Exercises - Seated BAPS: Sitting;Level 3;10 reps;Limitations BAPS Limitations: A/P, R/L, CW, CCW    Modalities Modalities: Iontophoresis Manual Therapy Manual Therapy: Myofascial release Myofascial Release: MFR to peronal musculature, 5th metarsal and plantar fascia to reduce fascila restrictions Iontophoresis Type of Iontophoresis: Dexamethasone Location: lateral foot body of 5th metatarsal  Dose: 2 cc 37mA Time: 12 minutes  Physical Therapy Assessment and Plan PT Assessment and Plan Clinical Impression Statement: Progressed standing exercises to dynamic surfaces to improve proprioception with gait, pt able to complete with no reports of pain.  Noted increased supination with gait on dynamic surface, pt educated on quality shoe stores in this area that would be helpful for good suppoprt with gait.  Manual  techniques complete to reduce fascial restrictions peronal musculature to 5th metratarsal region.  Ended session with ionto per MD order.  Pt reported pain free at end of session.   PT Plan: Re-eval next session.  Continue iontophoresis with dex as ordered on script, Ultrasound/electrical stimulation prn, ice, education, ROM, manual  techniques, taping, footwear education.    Goals PT Short Term Goals Time to Complete Short Term Goals: 2 weeks PT Short Term Goal 1: reports pain reduced 25-50% with daily routine with rehab and use of iontophorisis treatment in clinic  PT Short Term Goal 1 - Progress: Progressing toward goal PT Short Term Goal 2: patient to report no increase pain or difficulty walking one to two blocks  ( 5-10 minutes)  PT Long Term Goals Time to Complete Long Term Goals: 4 weeks PT Long Term Goal 1: pain at worst less than 3/10 left foot for reduced daily pain  PT Long Term Goal 1 - Progress: Progressing toward goal PT Long Term Goal 2: minimal tenderness to no tenderness with palpation along 5th left metarsal to indicate healing  PT Long Term Goal 2 - Progress: Progressing toward goal Long Term Goal 3: standing acitivities greater than 15 min without 5th toe parasthsia or pain graeter tahn 1-2 /10  Long Term Goal 4: left single letg stance 5 seconds without pain symptoms   Problem List Patient Active Problem List   Diagnosis Date Noted  . Peroneal tendonitis of left lower extremity 12/20/2013  . Incisional hernia 10/04/2012  . Abdominal pain 03/30/2012  . Perforated bowel 03/30/2012  . Leukocytosis 03/30/2012  . Hyperlipidemia 03/30/2012  . GERD (gastroesophageal reflux disease) 03/30/2012    PT - End of Session Activity Tolerance: Patient tolerated treatment well General Behavior During Therapy: [facility] for tasks assessed/performed  GP  Aldona Lento 01/17/2014, 9:41 AM

## 2014-01-20 ENCOUNTER — Ambulatory Visit (HOSPITAL_COMMUNITY)
Admission: RE | Admit: 2014-01-20 | Discharge: 2014-01-20 | Disposition: A | Discharge status: Home / Self Care | Payer: 59 | Source: Ambulatory Visit | Attending: Physician Assistant | Admitting: Physician Assistant

## 2014-01-20 DIAGNOSIS — M25579 Pain in unspecified ankle and joints of unspecified foot: Secondary | ICD-10-CM | POA: Insufficient documentation

## 2014-01-20 DIAGNOSIS — R269 Unspecified abnormalities of gait and mobility: Secondary | ICD-10-CM | POA: Insufficient documentation

## 2014-01-20 DIAGNOSIS — M7672 Peroneal tendinitis, left leg: Secondary | ICD-10-CM

## 2014-01-20 DIAGNOSIS — M7750 Other enthesopathy of unspecified foot: Secondary | ICD-10-CM | POA: Insufficient documentation

## 2014-01-20 DIAGNOSIS — IMO0001 Reserved for inherently not codable concepts without codable children: Secondary | ICD-10-CM | POA: Insufficient documentation

## 2014-01-20 NOTE — Progress Notes (Signed)
Physical Therapy Treatment Patient Details  Name: Jeffrey [name] MRN: [id number] Date of Birth: 1950-11-24  Today's Date: 01/20/2014 Time: 7897-8478 PT Time Calculation (min): 60 min Charges: 840-850 manual therapy, 850-925 Therapeutic Exercise, 925-940 Iontophoresis  Visit#: 9 of 12  Re-eval: 01/19/14 Assessment Diagnosis: peroneal tendonitis left foot  Next MD Visit: Dr. Doran Durand  Prior Therapy: no   Authorization: united   Authorization Visit#: 9 of 12   Subjective: Symptoms/Limitations Symptoms: Pt stated he was pain free today notes mild tenderness over fifth met head. Patient notes increased stretch with anterior lateral stepping durign calf stretch   Pertinent History: plantar fibroma removed 2012 , hernia surgeries , right knee surgery 2 years ago  Limitations: Standing;Walking How long can you sit comfortably?: comfortable in sitting   How long can you walk comfortably?: 30+ minutes but with pain  Special Tests: states x rays negative   Exercise/Treatments Ankle Stretches Other Stretch: 3 way gastroc stretch 10x 3sec each Other Stretch: 3D ankle excursion. 10x each   Ankle Exercises - Standing Other Standing Ankle Exercises: 3way flamengo 10x each Other Standing Ankle Exercises: Sagittal X Transverse Calf raise 3x 9 positions   Manual Therapy Manual Therapy: Joint mobilization Joint Mobilization: Cuboid whip, and AP talar mobilizatiopn grade 3 and 4 Iontophoresis Type of Iontophoresis: Dexamethasone Location: lateral foot body of 5th metatarsal  Dose: 2 cc 27m Time: 11 minutes  Physical Therapy Assessment and Plan PT Assessment and Plan Clinical Impression Statement: Utilized multiplanar calf and ankle mobility exercises to assess treat current limitations. Patientdisplays significant strength and mobility limitation in medial gastroc/soleus muscles resultign in increased landing through Lateral foot and increased pressure on 5th metatarsl head. Following medial  gastroc/soleus stretching/strengthening exercises patient displays improves weight ditriution and improved ankle mobility resulting in decreased supination following heel strike. Patient resports no pain at end of session, but that he feels his foot moving better.  Pt will benefit from skilled therapeutic intervention in order to improve on the following deficits: Abnormal gait;Pain;Decreased mobility Rehab Potential: Good PT Frequency: Min 3X/week PT Plan: Re-eval next session.  Continue iontophoresis with dex as ordered on script, Ultrasound/electrical stimulation prn, ice, education, ROM, manual  techniques, taping, footwear education. Progress to single leg standing balance reach matrix common.     Goals  Problem List Patient Active Problem List   Diagnosis Date Noted  . Peroneal tendonitis of left lower extremity 12/20/2013  . Incisional hernia 10/04/2012  . Abdominal pain 03/30/2012  . Perforated bowel 03/30/2012  . Leukocytosis 03/30/2012  . Hyperlipidemia 03/30/2012  . GERD (gastroesophageal reflux disease) 03/30/2012    PT - End of Session Activity Tolerance: Patient tolerated treatment well General Behavior During Therapy: WFL for tasks assessed/performed PT Plan of Care PT Home Exercise Plan: Given blue theraband and stretches HEP, added 3 way calf stretch and 3 way flamengo  GP    Moo Gravley R PT, DPT 01/20/2014, 9:49 AM

## 2014-01-24 ENCOUNTER — Ambulatory Visit (HOSPITAL_COMMUNITY)
Admission: RE | Admit: 2014-01-24 | Discharge: 2014-01-24 | Disposition: A | Discharge status: Home / Self Care | Payer: 59 | Source: Ambulatory Visit | Attending: Internal Medicine | Admitting: Internal Medicine

## 2014-01-24 NOTE — Evaluation (Addendum)
Physical Therapy Reevaluation/discharge  Summary   Patient Details  Name: Jeffrey [name] MRN: [id number] Date of Birth: Mar 02, 1951  Today's Date: 01/24/2014 Time: 1443-1540 PT Time Calculation (min): 57 min Charges: MMT/ROMM x 0(8676-1950) Self care x 93'(2671-2458) Ionto x 09'(9833-8250)              Visit#: 10 of 12  Re-eval: 01/19/14 Assessment Diagnosis: peroneal tendonitis left foot  Next MD Visit: Dr. Doran Durand - April 2015  Authorization: united     Authorization Visit#: 10 of 12   Past Medical History:  Past Medical History  Diagnosis Date  . Hypercholesteremia   . Cancer     skin cancer-chest and neck  . GERD (gastroesophageal reflux disease)   . Ventral hernia   . Abdominal distension   . Abdominal pain    Past Surgical History:  Past Surgical History  Procedure Laterality Date  . Knee arthroscopy      with bone removal-right leg- 2 yrs ago  . Hernia repair      umbilical hernia [id number]-3-4 yrs ago  . Cystectomy      right thumb 2o yrs  . Mass excision  12/11/2011    Procedure: EXCISION MASS;  Surgeon: Marcheta Grammes, DPM;  Location: [facility];  Service: Orthopedics;  Laterality: Left;  Excision of plantar fibroma  . Cystectomy    . Laparotomy  03/30/2012    Procedure: EXPLORATORY LAPAROTOMY;  Surgeon: Donato Heinz, MD;  Location: [facility];  Service: General;  Laterality: N/A;  . Bowel resection  03/30/2012    Procedure: SMALL BOWEL RESECTION;  Surgeon: Donato Heinz, MD;  Location: [facility];  Service: General;  Laterality: N/A;  . Incisional hernia repair  10/22/2012    Procedure: LAPAROSCOPIC INCISIONAL HERNIA;  Surgeon: Adin Hector, MD;  Location: [facility];  Service: General;  Laterality: N/A;  incarcerated  . Insertion of mesh  10/22/2012    Procedure: INSERTION OF MESH;  Surgeon: Adin Hector, MD;  Location: [facility];  Service: General;  Laterality: N/A;  . Colonoscopy N/A 01/10/2014    Procedure: COLONOSCOPY;  Surgeon: Daneil Dolin, MD;  Location: [facility];  Service: Endoscopy;  Laterality: N/A;  1:30 PM    Subjective Symptoms/Limitations Symptoms: Pt reports some pain when he shifts weight to the outside of his foot. Pain Assessment Currently in Pain?: Yes Pain Score: 1  Pain Location: Foot Pain Orientation: Left   Sensation/Coordination/Flexibility/Functional Tests Functional Tests Functional Tests: FOTO 51 (was 48 12/20/13)  Assessment LLE AROM (degrees) Left Ankle Dorsiflexion: 15 (was 15 12/20/13) Left Ankle Plantar Flexion: 55 (was 25 12/20/13) Left Ankle Inversion: 30 (was 30 12/20/13) Left Ankle Eversion: 18 (was 15 12/20/13) LLE Strength Left Ankle Dorsiflexion: 5/5 (was 5/5 12/20/13) Left Ankle Plantar Flexion: 5/5 (was 4/5 12/20/13) Left Ankle Inversion: 5/5 (was 5/5 12/22/13) Left Ankle Eversion: 5/5 (was 4/5 12/20/13)  Exercise/Treatments Modalities Modalities: Iontophoresis Iontophoresis Type of Iontophoresis: Dexamethasone Location: lateral foot body of 5th metatarsal  Dose: 2 cc 42mA Time: 15'  Physical Therapy Assessment and Plan PT Assessment and Plan Clinical Impression Statement: Pt has progressed well with therapy. Pt displays improved strength and stability. Pt also reports significant decrease in pain. All goals have been met except LTG #3. Pt is independent with HEP and feels comfortable with D/C to HEP.  Pt will benefit from skilled therapeutic intervention in order to improve on the following deficits: Abnormal gait;Pain;Decreased mobility Rehab Potential: Good PT Frequency: Min 3X/week PT Plan: Recommend  D/C to HEP.    Goals PT Short Term Goals Time to Complete Short Term Goals: 2 weeks PT Short Term Goal 1: reports pain reduced 25-50% with daily routine with rehab and use of iontophorisis treatment in clinic.  PT Short Term Goal 1 - Progress: Met PT Short Term Goal 2: Patient to report no increase pain or difficulty walking one to two blocks  ( 5-10 minutes)  PT Short Term Goal 2 - Progress:  Met PT Long Term Goals Time to Complete Long Term Goals: 4 weeks PT Long Term Goal 1: Pain at worst less than 3/10 left foot for reduced daily pain  PT Long Term Goal 1 - Progress: Met PT Long Term Goal 2: Minimal tenderness to no tenderness with palpation along 5th left metarsal to indicate healing.  PT Long Term Goal 2 - Progress: Met Long Term Goal 3: standing acitivities greater than 15 min without 5th toe parasthsia or pain graeter than 1-2 /10  Long Term Goal 3 Progress: Progressing toward goal Long Term Goal 4: Left single letg stance 5 seconds without pain symptoms.  Long Term Goal 4 Progress: Met  Problem List Patient Active Problem List   Diagnosis Date Noted  . Peroneal tendonitis of left lower extremity 12/20/2013  . Incisional hernia 10/04/2012  . Abdominal pain 03/30/2012  . Perforated bowel 03/30/2012  . Leukocytosis 03/30/2012  . Hyperlipidemia 03/30/2012  . GERD (gastroesophageal reflux disease) 03/30/2012    PT - End of Session Activity Tolerance: Patient tolerated treatment well General Behavior During Therapy: [facility] for tasks assessed/performed  Rachelle Hora, PTA  01/24/2014, 9:56 AM  Physician Documentation Your signature is required to indicate approval of the treatment plan as stated above.  Please sign and either send electronically or make a copy of this report for your files and return this physician signed original.   Please mark one 1.__approve of plan  2. ___approve of plan with the following conditions.   ______________________________                                                          _____________________ Physician Signature                                                                                                             Date

## 2014-01-26 ENCOUNTER — Ambulatory Visit (HOSPITAL_COMMUNITY): Payer: 59 | Admitting: Physical Therapy

## 2014-01-31 ENCOUNTER — Ambulatory Visit (HOSPITAL_COMMUNITY): Payer: 59

## 2014-02-02 ENCOUNTER — Ambulatory Visit (HOSPITAL_COMMUNITY): Payer: 59

## 2014-02-07 ENCOUNTER — Other Ambulatory Visit: Payer: Self-pay | Admitting: Physician Assistant

## 2014-02-07 DIAGNOSIS — C4491 Basal cell carcinoma of skin, unspecified: Secondary | ICD-10-CM

## 2014-02-07 HISTORY — DX: Basal cell carcinoma of skin, unspecified: C44.91

## 2014-03-06 ENCOUNTER — Encounter (HOSPITAL_BASED_OUTPATIENT_CLINIC_OR_DEPARTMENT_OTHER): Payer: Self-pay | Admitting: *Deleted

## 2014-03-06 NOTE — Progress Notes (Signed)
No labs needed

## 2014-03-08 ENCOUNTER — Other Ambulatory Visit: Payer: Self-pay | Admitting: Orthopedic Surgery

## 2014-03-09 ENCOUNTER — Encounter (HOSPITAL_BASED_OUTPATIENT_CLINIC_OR_DEPARTMENT_OTHER): Payer: Self-pay | Admitting: *Deleted

## 2014-03-09 ENCOUNTER — Ambulatory Visit (HOSPITAL_BASED_OUTPATIENT_CLINIC_OR_DEPARTMENT_OTHER)
Admission: RE | Admit: 2014-03-09 | Discharge: 2014-03-09 | Disposition: A | Discharge status: Home / Self Care | Payer: 59 | Source: Ambulatory Visit | Attending: Orthopedic Surgery | Admitting: Orthopedic Surgery

## 2014-03-09 ENCOUNTER — Encounter (HOSPITAL_BASED_OUTPATIENT_CLINIC_OR_DEPARTMENT_OTHER): Payer: 59 | Admitting: Certified Registered"

## 2014-03-09 ENCOUNTER — Ambulatory Visit (HOSPITAL_BASED_OUTPATIENT_CLINIC_OR_DEPARTMENT_OTHER): Payer: 59 | Admitting: Certified Registered"

## 2014-03-09 ENCOUNTER — Encounter (HOSPITAL_BASED_OUTPATIENT_CLINIC_OR_DEPARTMENT_OTHER)
Admission: RE | Disposition: A | Discharge status: Home / Self Care | Payer: Self-pay | Source: Ambulatory Visit | Attending: Orthopedic Surgery

## 2014-03-09 DIAGNOSIS — M7672 Peroneal tendinitis, left leg: Secondary | ICD-10-CM

## 2014-03-09 DIAGNOSIS — K219 Gastro-esophageal reflux disease without esophagitis: Secondary | ICD-10-CM | POA: Insufficient documentation

## 2014-03-09 DIAGNOSIS — M249 Joint derangement, unspecified: Secondary | ICD-10-CM | POA: Insufficient documentation

## 2014-03-09 DIAGNOSIS — Z79899 Other long term (current) drug therapy: Secondary | ICD-10-CM | POA: Insufficient documentation

## 2014-03-09 DIAGNOSIS — Z7982 Long term (current) use of aspirin: Secondary | ICD-10-CM | POA: Insufficient documentation

## 2014-03-09 DIAGNOSIS — M65979 Unspecified synovitis and tenosynovitis, unspecified ankle and foot: Secondary | ICD-10-CM | POA: Insufficient documentation

## 2014-03-09 DIAGNOSIS — E7800 Pure hypercholesterolemia, unspecified: Secondary | ICD-10-CM | POA: Insufficient documentation

## 2014-03-09 DIAGNOSIS — M24176 Other articular cartilage disorders, unspecified foot: Secondary | ICD-10-CM | POA: Insufficient documentation

## 2014-03-09 DIAGNOSIS — M24173 Other articular cartilage disorders, unspecified ankle: Secondary | ICD-10-CM | POA: Insufficient documentation

## 2014-03-09 DIAGNOSIS — M659 Synovitis and tenosynovitis, unspecified: Secondary | ICD-10-CM | POA: Insufficient documentation

## 2014-03-09 HISTORY — DX: Presence of spectacles and contact lenses: Z97.3

## 2014-03-09 HISTORY — PX: REPAIR OF PERONEUS BREVIS TENDON: SHX6215

## 2014-03-09 LAB — POCT HEMOGLOBIN-HEMACUE: Hemoglobin: 13.6 g/dL (ref 13.0–17.0)

## 2014-03-09 SURGERY — REPAIR, TENDON, PERONEUS BREVIS
Anesthesia: General | Site: Ankle | Laterality: Left

## 2014-03-09 MED ORDER — MIDAZOLAM HCL 2 MG/2ML IJ SOLN
INTRAMUSCULAR | Status: AC
  Filled 2014-03-09: qty 2

## 2014-03-09 MED ORDER — LIDOCAINE HCL (CARDIAC) 20 MG/ML IV SOLN
INTRAVENOUS | Status: DC | PRN
  Administered 2014-03-09: 30 mg via INTRAVENOUS

## 2014-03-09 MED ORDER — FENTANYL CITRATE 0.05 MG/ML IJ SOLN
50.0000 ug | INTRAMUSCULAR | Status: DC | PRN
  Administered 2014-03-09: 100 ug via INTRAVENOUS

## 2014-03-09 MED ORDER — 0.9 % SODIUM CHLORIDE (POUR BTL) OPTIME
TOPICAL | Status: DC | PRN
  Administered 2014-03-09: 200 mL

## 2014-03-09 MED ORDER — BACITRACIN ZINC 500 UNIT/GM EX OINT
TOPICAL_OINTMENT | CUTANEOUS | Status: AC
  Filled 2014-03-09: qty 28.35

## 2014-03-09 MED ORDER — ONDANSETRON HCL 4 MG/2ML IJ SOLN
INTRAMUSCULAR | Status: DC | PRN
  Administered 2014-03-09: 4 mg via INTRAVENOUS

## 2014-03-09 MED ORDER — CEFAZOLIN SODIUM-DEXTROSE 2-3 GM-% IV SOLR
2.0000 g | INTRAVENOUS | Status: AC
  Administered 2014-03-09: 2 g via INTRAVENOUS

## 2014-03-09 MED ORDER — FENTANYL CITRATE 0.05 MG/ML IJ SOLN
INTRAMUSCULAR | Status: AC
  Filled 2014-03-09: qty 2

## 2014-03-09 MED ORDER — PROPOFOL 10 MG/ML IV BOLUS
INTRAVENOUS | Status: DC | PRN
  Administered 2014-03-09: 150 mg via INTRAVENOUS

## 2014-03-09 MED ORDER — CEFAZOLIN SODIUM-DEXTROSE 2-3 GM-% IV SOLR
INTRAVENOUS | Status: AC
  Filled 2014-03-09: qty 50

## 2014-03-09 MED ORDER — PROPOFOL 10 MG/ML IV EMUL
INTRAVENOUS | Status: AC
  Filled 2014-03-09: qty 100

## 2014-03-09 MED ORDER — HYDROCODONE-ACETAMINOPHEN 5-325 MG PO TABS: 1.0000 | ORAL_TABLET | Freq: Four times a day (QID) | ORAL | Status: DC | PRN

## 2014-03-09 MED ORDER — CHLORHEXIDINE GLUCONATE 4 % EX LIQD: 60.0000 mL | Freq: Once | CUTANEOUS | Status: DC

## 2014-03-09 MED ORDER — MIDAZOLAM HCL 5 MG/5ML IJ SOLN
INTRAMUSCULAR | Status: DC | PRN
  Administered 2014-03-09: 1 mg via INTRAVENOUS

## 2014-03-09 MED ORDER — DEXAMETHASONE SODIUM PHOSPHATE 10 MG/ML IJ SOLN
INTRAMUSCULAR | Status: DC | PRN
  Administered 2014-03-09: 10 mg via INTRAVENOUS

## 2014-03-09 MED ORDER — OXYCODONE HCL 5 MG/5ML PO SOLN: 5.0000 mg | Freq: Once | ORAL | Status: DC | PRN

## 2014-03-09 MED ORDER — SODIUM CHLORIDE 0.9 % IV SOLN: INTRAVENOUS | Status: DC

## 2014-03-09 MED ORDER — BACITRACIN ZINC 500 UNIT/GM EX OINT
TOPICAL_OINTMENT | CUTANEOUS | Status: DC | PRN
  Administered 2014-03-09: 1 via TOPICAL

## 2014-03-09 MED ORDER — MIDAZOLAM HCL 2 MG/2ML IJ SOLN
1.0000 mg | INTRAMUSCULAR | Status: DC | PRN
  Administered 2014-03-09: 2 mg via INTRAVENOUS

## 2014-03-09 MED ORDER — HYDROMORPHONE HCL PF 1 MG/ML IJ SOLN: 0.2500 mg | INTRAMUSCULAR | Status: DC | PRN

## 2014-03-09 MED ORDER — FENTANYL CITRATE 0.05 MG/ML IJ SOLN
INTRAMUSCULAR | Status: AC
  Filled 2014-03-09: qty 6

## 2014-03-09 MED ORDER — ONDANSETRON HCL 4 MG/2ML IJ SOLN: 4.0000 mg | Freq: Once | INTRAMUSCULAR | Status: DC | PRN

## 2014-03-09 MED ORDER — LIDOCAINE-EPINEPHRINE (PF) 1.5 %-1:200000 IJ SOLN
INTRAMUSCULAR | Status: DC | PRN
  Administered 2014-03-09: 30 mL via PERINEURAL

## 2014-03-09 MED ORDER — OXYCODONE HCL 5 MG PO TABS: 5.0000 mg | ORAL_TABLET | Freq: Once | ORAL | Status: DC | PRN

## 2014-03-09 MED ORDER — BUPIVACAINE-EPINEPHRINE (PF) 0.5% -1:200000 IJ SOLN
INTRAMUSCULAR | Status: DC | PRN
  Administered 2014-03-09: 30 mL

## 2014-03-09 MED ORDER — MEPERIDINE HCL 25 MG/ML IJ SOLN: 6.2500 mg | INTRAMUSCULAR | Status: DC | PRN

## 2014-03-09 MED ORDER — FENTANYL CITRATE 0.05 MG/ML IJ SOLN
INTRAMUSCULAR | Status: DC | PRN
  Administered 2014-03-09: 25 ug via INTRAVENOUS

## 2014-03-09 MED ORDER — EPHEDRINE SULFATE 50 MG/ML IJ SOLN
INTRAMUSCULAR | Status: DC | PRN
  Administered 2014-03-09: 10 mg via INTRAVENOUS

## 2014-03-09 MED ORDER — LACTATED RINGERS IV SOLN
INTRAVENOUS | Status: DC
  Administered 2014-03-09 (×2): via INTRAVENOUS

## 2014-03-09 SURGICAL SUPPLY — 72 items
BANDAGE ESMARK 6X9 LF (GAUZE/BANDAGES/DRESSINGS) ×1 IMPLANT
BLADE 15 SAFETY STRL DISP (BLADE) IMPLANT
BLADE AVERAGE 25X9 (BLADE) IMPLANT
BLADE SURG 15 STRL LF DISP TIS (BLADE) ×2 IMPLANT
BLADE SURG 15 STRL SS (BLADE) ×2
BNDG COHESIVE 4X5 TAN STRL (GAUZE/BANDAGES/DRESSINGS) ×2 IMPLANT
BNDG COHESIVE 6X5 TAN STRL LF (GAUZE/BANDAGES/DRESSINGS) ×2 IMPLANT
BNDG ESMARK 4X9 LF (GAUZE/BANDAGES/DRESSINGS) IMPLANT
BNDG ESMARK 6X9 LF (GAUZE/BANDAGES/DRESSINGS) ×2
CHLORAPREP W/TINT 26ML (MISCELLANEOUS) ×2 IMPLANT
COVER TABLE BACK 60X90 (DRAPES) ×2 IMPLANT
CUFF TOURNIQUET SINGLE 34IN LL (TOURNIQUET CUFF) ×2 IMPLANT
DECANTER SPIKE VIAL GLASS SM (MISCELLANEOUS) IMPLANT
DRAPE C-ARM 42X72 X-RAY (DRAPES) IMPLANT
DRAPE C-ARMOR (DRAPES) IMPLANT
DRAPE EXTREMITY T 121X128X90 (DRAPE) ×2 IMPLANT
DRAPE OEC MINIVIEW 54X84 (DRAPES) IMPLANT
DRAPE U-SHAPE 47X51 STRL (DRAPES) ×2 IMPLANT
DRSG EMULSION OIL 3X3 NADH (GAUZE/BANDAGES/DRESSINGS) ×2 IMPLANT
DRSG PAD ABDOMINAL 8X10 ST (GAUZE/BANDAGES/DRESSINGS) ×4 IMPLANT
ELECT REM PT RETURN 9FT ADLT (ELECTROSURGICAL) ×2
ELECTRODE REM PT RTRN 9FT ADLT (ELECTROSURGICAL) ×1 IMPLANT
GAUZE SPONGE 4X4 12PLY STRL (GAUZE/BANDAGES/DRESSINGS) ×2 IMPLANT
GLOVE BIO SURGEON STRL SZ8 (GLOVE) ×2 IMPLANT
GLOVE BIOGEL M STRL SZ7.5 (GLOVE) ×6 IMPLANT
GLOVE BIOGEL PI IND STRL 7.0 (GLOVE) ×1 IMPLANT
GLOVE BIOGEL PI IND STRL 8 (GLOVE) ×2 IMPLANT
GLOVE BIOGEL PI INDICATOR 7.0 (GLOVE) ×1
GLOVE BIOGEL PI INDICATOR 8 (GLOVE) ×2
GLOVE EXAM NITRILE MD LF STRL (GLOVE) ×2 IMPLANT
GOWN STRL REUS W/ TWL LRG LVL3 (GOWN DISPOSABLE) ×1 IMPLANT
GOWN STRL REUS W/ TWL XL LVL3 (GOWN DISPOSABLE) ×2 IMPLANT
GOWN STRL REUS W/TWL LRG LVL3 (GOWN DISPOSABLE) ×1
GOWN STRL REUS W/TWL XL LVL3 (GOWN DISPOSABLE) ×2
K-WIRE .062X4 (WIRE) IMPLANT
NEEDLE HYPO 22GX1.5 SAFETY (NEEDLE) IMPLANT
NS IRRIG 1000ML POUR BTL (IV SOLUTION) ×2 IMPLANT
PACK BASIN DAY SURGERY FS (CUSTOM PROCEDURE TRAY) ×2 IMPLANT
PAD CAST 4YDX4 CTTN HI CHSV (CAST SUPPLIES) ×1 IMPLANT
PADDING CAST ABS 4INX4YD NS (CAST SUPPLIES)
PADDING CAST ABS COTTON 4X4 ST (CAST SUPPLIES) IMPLANT
PADDING CAST COTTON 4X4 STRL (CAST SUPPLIES) ×1
PADDING CAST COTTON 6X4 STRL (CAST SUPPLIES) ×2 IMPLANT
PASSER SUT SWANSON 36MM LOOP (INSTRUMENTS) IMPLANT
PENCIL BUTTON HOLSTER BLD 10FT (ELECTRODE) ×2 IMPLANT
SANITIZER HAND PURELL 535ML FO (MISCELLANEOUS) ×2 IMPLANT
SHEET MEDIUM DRAPE 40X70 STRL (DRAPES) ×2 IMPLANT
SLEEVE SCD COMPRESS KNEE MED (MISCELLANEOUS) ×2 IMPLANT
SPLINT FAST PLASTER 5X30 (CAST SUPPLIES) ×20
SPLINT PLASTER CAST FAST 5X30 (CAST SUPPLIES) ×20 IMPLANT
SPONGE LAP 18X18 X RAY DECT (DISPOSABLE) ×2 IMPLANT
STOCKINETTE 6  STRL (DRAPES) ×1
STOCKINETTE 6 STRL (DRAPES) ×1 IMPLANT
SUCTION FRAZIER TIP 10 FR DISP (SUCTIONS) IMPLANT
SUT ETHIBOND 0 MO6 C/R (SUTURE) ×2 IMPLANT
SUT ETHIBOND 2 OS 4 DA (SUTURE) IMPLANT
SUT ETHILON 3 0 PS 1 (SUTURE) ×2 IMPLANT
SUT FIBERWIRE 2-0 18 17.9 3/8 (SUTURE)
SUT MERSILENE 2.0 SH NDLE (SUTURE) IMPLANT
SUT MNCRL AB 3-0 PS2 18 (SUTURE) ×2 IMPLANT
SUT MNCRL AB 4-0 PS2 18 (SUTURE) IMPLANT
SUT VIC AB 0 SH 27 (SUTURE) ×2 IMPLANT
SUT VIC AB 2-0 SH 18 (SUTURE) IMPLANT
SUT VIC AB 2-0 SH 27 (SUTURE)
SUT VIC AB 2-0 SH 27XBRD (SUTURE) IMPLANT
SUT VICRYL 4-0 PS2 18IN ABS (SUTURE) IMPLANT
SUTURE FIBERWR 2-0 18 17.9 3/8 (SUTURE) IMPLANT
SYR BULB 3OZ (MISCELLANEOUS) ×2 IMPLANT
TOWEL OR 17X24 6PK STRL BLUE (TOWEL DISPOSABLE) ×4 IMPLANT
TOWEL OR NON WOVEN STRL DISP B (DISPOSABLE) ×2 IMPLANT
TUBE CONNECTING 20X1/4 (TUBING) IMPLANT
UNDERPAD 30X30 INCONTINENT (UNDERPADS AND DIAPERS) ×2 IMPLANT

## 2014-03-09 NOTE — Anesthesia Procedure Notes (Addendum)
Anesthesia Regional Block:  Popliteal block  Pre-Anesthetic Checklist: ,, timeout performed, Correct Patient, Correct Site, Correct Laterality, Correct Procedure, Correct Position, site marked, Risks and benefits discussed,  Surgical consent,  Pre-op evaluation,  At surgeon's request and post-op pain management  Laterality: Left  Prep: chloraprep       Needles:  Injection technique: Single-shot  Needle Type: Echogenic Stimulator Needle     Needle Length: 10cm 10 cm Needle Gauge: 21 and 21 G    Additional Needles:  Procedures: ultrasound guided (picture in chart) and nerve stimulator Popliteal block  Nerve Stimulator or Paresthesia:  Response: 0.4 mA,   Additional Responses:   Narrative:  Start time: 03/09/2014 7:15 AM End time: 03/09/2014 7:27 AM Injection made incrementally with aspirations every 5 mL.  Performed by: Personally  Anesthesiologist: Lillia Abed MD  Additional Notes: Monitors applied. Patient sedated. Sterile prep and drape,hand hygiene and sterile gloves were used. Relevant anatomy identified.Needle position confirmed.Local anesthetic injected incrementally after negative aspiration. Local anesthetic spread visualized around nerve(s). Vascular puncture avoided. No complications. Image printed for medical record.The patient tolerated the procedure well.  Additional Saphenous nerve block performed. 15cc Local Anesthetic mixture placed under ultrasonic guidance along the medio-inferior border of the Sartorious muscle 6 inches above the knee.  No Problems encountered.  Lillia Abed MD

## 2014-03-09 NOTE — Anesthesia Preprocedure Evaluation (Addendum)
Anesthesia Evaluation  Patient identified by MRN, date of birth, ID band Patient awake    Reviewed: Allergy & Precautions, H&P , NPO status , Patient's Chart, lab work & pertinent test results  Airway Mallampati: I TM Distance: >3 FB Neck ROM: Full    Dental   Pulmonary          Cardiovascular     Neuro/Psych    GI/Hepatic GERD-  Medicated and Controlled,  Endo/Other    Renal/GU      Musculoskeletal   Abdominal   Peds  Hematology   Anesthesia Other Findings   Reproductive/Obstetrics                           Anesthesia Physical Anesthesia Plan  ASA: II  Anesthesia Plan: General   Post-op Pain Management:    Induction: Intravenous  Airway Management Planned: LMA  Additional Equipment:   Intra-op Plan:   Post-operative Plan: Extubation in OR  Informed Consent: I have reviewed the patients History and Physical, chart, labs and discussed the procedure including the risks, benefits and alternatives for the proposed anesthesia with the patient or authorized representative who has indicated his/her understanding and acceptance.     Plan Discussed with: CRNA and Surgeon  Anesthesia Plan Comments:         Anesthesia Quick Evaluation  

## 2014-03-09 NOTE — Brief Op Note (Signed)
03/09/2014  8:40 AM  PATIENT:  Jeffrey [name]  63 y.o. male  PRE-OPERATIVE DIAGNOSIS:  LEFT PERONEAL TENDON TEARS AND TENDONITIS  POST-OPERATIVE DIAGNOSIS: 1.  Left peroneus longus tenosynovitis      2.  Left peroneus brevis tendon tear      3.  Hypertrophic left calcaneus peroneal tubercle  Procedure(s): 1.  Left peroneus longus tenolysis 2.  Left peroneus brevis tendon repair 3.  Excision of left calcaneus peroneal tubercle  SURGEON:  Wylene Simmer, MD  ASSISTANT: n/a  ANESTHESIA:   General, regional  EBL:  minimal   TOURNIQUET:   Total Tourniquet Time Documented: Thigh (Left) - 30 minutes Total: Thigh (Left) - 30 minutes   COMPLICATIONS:  None apparent  DISPOSITION:  Extubated, awake and stable to recovery.  DICTATION ID:  [id number]

## 2014-03-09 NOTE — Progress Notes (Signed)
Assisted Dr. Ossey with left, ultrasound guided, popliteal/saphenous block. Side rails up, monitors on throughout procedure. See vital signs in flow sheet. Tolerated Procedure well. 

## 2014-03-09 NOTE — Op Note (Signed)
Jeffrey [name], Jeffrey [name] NO.:  [id number]  MEDICAL RECORD NO.:  [id number]  LOCATION:                                 FACILITY:  PHYSICIAN:  Wylene Simmer, MD        DATE OF BIRTH:  08-18-1951  DATE OF PROCEDURE:  03/09/2014 DATE OF DISCHARGE:                              OPERATIVE REPORT   PREOPERATIVE DIAGNOSIS:  Left peroneal tendon tears and tenosynovitis.  POSTOPERATIVE DIAGNOSES: 1. Left peroneus longus tenosynovitis. 2. Left peroneus brevis tendon tear. 3. Hypertrophic left calcaneus peroneal tubercle.  PROCEDURE: 1. Left peroneus longus tenolysis. 2. Left peroneus brevis tendon repair. 3. Excision of left calcaneus peroneal tubercle.  SURGEON:  Wylene Simmer, M.D.  ANESTHESIA:  General, regional.  ESTIMATED BLOOD LOSS:  Minimal.  TOURNIQUET TIME:  30 minutes at 220 mmHg.  COMPLICATIONS:  None.  APPARENT DISPOSITION:  Extubated awake and stable to recovery.  INDICATIONS FOR PROCEDURE:  The patient is a 63 year old male who complains of left ankle and foot pain now for many months.  He has failed non operative treatments to date including activity modifications, oral anti-inflammatory, shoe wear modification and physical therapy.  MRI shows tendinitis and tenosynovitis of the peroneal tendons.  He presents now for operative treatment of this painful condition.  He understands the risks and benefits, the alternative treatment options and elects surgical treatment.  He specifically understands risks of bleeding, infection, nerve damage, blood clots, need for additional surgery, continued pain, amputation and death.  PROCEDURE IN DETAIL:  After preoperative consent was obtained and the correct operative site was identified, the patient was brought to the operating room and placed supine on the operating table.  General anesthesia was induced.  Preoperative antibiotics were administered. Surgical time-out was taken.  Left lower extremity was prepped  and draped in standard sterile fashion with a tourniquet around the thigh. The extremity was exsanguinated.  The tourniquet was inflated to 220 mmHg.  A curvilinear incision was made over the course of the peroneal tendons.  Sharp dissection was carried down through the skin and subcutaneous tissue.  The peroneal tendon sheath was identified.  The sheath of the peroneus longus was incised.  The peroneus longus was noted to have significant tenosynovitis along its course.  There was no evidence of any tear of the tendon.  The tenosynovitis was all sharply excised with scissors and a scalpel.  The peroneal tubercle was noted to be quite hypertrophic.  The peroneus brevis tendon sheath was opened just superior to the peroneal tubercle.  Peroneus brevis tendon proximally appeared generally healthy, but just distal to the peroneal tubercle, there was evidence of longitudinal split tearing down to the level of the insertion of the fifth metatarsal base.  There was a central component that was a full-thickness tear.  The tear itself was then debrided back to level of healthy tendon.  Horizontal mattress sutures of 0 Ethibond were used to repair the tear followed by running 0 Ethibond along the course of the tear proximally and distally.  The peroneal tubercle was then excised in its entirety and the cut surface of bone smoothed with a rasp.  The wound was  irrigated copiously.  The peroneal tendon sheath was repaired with simple sutures of 0 Vicryl. Subcutaneous tissue was approximated with inverted simple sutures of 3-0 Monocryl and a running 3-0 nylon was used to close the skin incision. Sterile dressings were applied followed by a well-padded short-leg splint with the ankle held in the neutral position.  The tourniquet was released at approximately 30 minutes after application of the dressings. The patient was then awakened from anesthesia and transported to the recovery room in stable  condition.  FOLLOWUP PLAN:  The patient will be nonweightbearing for the next 2 weeks in his splint.  He will follow up with me at that point for conversion to a cam walker boot and to initiate range of motion in plantar flexion and dorsiflexion.     Wylene Simmer, MD     JH/MEDQ  D:  03/09/2014  T:  03/09/2014  Job:  [id number]

## 2014-03-09 NOTE — Discharge Instructions (Signed)
,Wylene Simmer, MD [place]  Please read the following information regarding your care after surgery.  Medications  You only need a prescription for the narcotic pain medicine (ex. oxycodone, Percocet, Norco).  All of the other medicines listed below are available over the counter. Norco as prescribed for pain.  You may also take ibuprofen 800 mg every 8 hours as needed for pain.  Narcotic pain medicine (ex. oxycodone, Percocet, Vicodin) will cause constipation.  To prevent this problem, take the following medicines while you are taking any pain medicine. X docusate sodium (Colace) 100 mg twice a day X senna (Senokot) 2 tablets twice a day  X To help prevent blood clots, take an aspirin (325 mg) once a day until you are bearing weight.  You should also get up every hour while you are awake to move around.    Weight Bearing ? Bear weight when you are able on your operated leg or foot. ? Bear weight only on the heel of your operated foot in the post-op shoe. X Do not bear any weight on the operated leg or foot.  Cast / Splint / Dressing X Keep your splint or cast clean and dry.  Dont put anything (coat hanger, pencil, etc) down inside of it.  If it gets damp, use a hair dryer on the cool setting to dry it.  If it gets soaked, call the office to schedule an appointment for a cast change. ? Remove your dressing 3 days after surgery and cover the incisions with dry dressings.    After your dressing, cast or splint is removed; you may shower, but do not soak or scrub the wound.  Allow the water to run over it, and then gently pat it dry.  Swelling It is normal for you to have swelling where you had surgery.  To reduce swelling and pain, keep your toes above your nose for at least 3 days after surgery.  It may be necessary to keep your foot or leg elevated for several weeks.  If it hurts, it should be elevated.  Follow Up Call my office at [phone] when you are discharged from  the hospital or surgery center to schedule an appointment to be seen two weeks after surgery.  Call my office at [phone] if you develop a fever >101.5 F, nausea, vomiting, bleeding from the surgical site or severe pain.     Post Anesthesia Home Care Instructions  Activity: Get plenty of rest for the remainder of the day. A responsible adult should stay with you for 24 hours following the procedure.  For the next 24 hours, DO NOT: -Drive a car -Paediatric nurse -Drink alcoholic beverages -Take any medication unless instructed by your physician -Make any legal decisions or sign important papers.  Meals: Start with liquid foods such as gelatin or soup. Progress to regular foods as tolerated. Avoid greasy, spicy, heavy foods. If nausea and/or vomiting occur, drink only clear liquids until the nausea and/or vomiting subsides. Call your physician if vomiting continues.  Special Instructions/Symptoms: Your throat may feel dry or sore from the anesthesia or the breathing tube placed in your throat during surgery. If this causes discomfort, gargle with warm salt water. The discomfort should disappear within 24 hours.   Regional Anesthesia Blocks  1. Numbness or the inability to move the "blocked" extremity may last from 3-48 hours after placement. The length of time depends on the medication injected and your individual response to the medication. If the numbness is  not going away after 48 hours, call your surgeon.  2. The extremity that is blocked will need to be protected until the numbness is gone and the  Strength has returned. Because you cannot feel it, you will need to take extra care to avoid injury. Because it may be weak, you may have difficulty moving it or using it. You may not know what position it is in without looking at it while the block is in effect.  3. For blocks in the legs and feet, returning to weight bearing and walking needs to be done carefully. You will need to  wait until the numbness is entirely gone and the strength has returned. You should be able to move your leg and foot normally before you try and bear weight or walk. You will need someone to be with you when you first try to ensure you do not fall and possibly risk injury.  4. Bruising and tenderness at the needle site are common side effects and will resolve in a few days.  5. Persistent numbness or new problems with movement should be communicated to the surgeon or the [place] [phone] [place] [phone]).

## 2014-03-09 NOTE — Anesthesia Postprocedure Evaluation (Signed)
Anesthesia Post Note  Patient: Jeffrey [name]  Procedure(s) Performed: Procedure(s) (LRB): LEFT PERONEUS BREVIS LONGUS TENOLYSIS AND TENDON REPAIR  (Left)  Anesthesia type: general  Patient location: PACU  Post pain: Pain level controlled  Post assessment: Patient's Cardiovascular Status Stable  Last Vitals:  Filed Vitals:   03/09/14 1015  BP: 147/74  Pulse: 102  Temp: 35.9 C  Resp: 16    Post vital signs: Reviewed and stable  Level of consciousness: sedated  Complications: No apparent anesthesia complications

## 2014-03-09 NOTE — Transfer of Care (Signed)
Immediate Anesthesia Transfer of Care Note  Patient: Jeffrey [name]  Procedure(s) Performed: Procedure(s): LEFT PERONEUS BREVIS LONGUS TENOLYSIS AND TENDON REPAIR  (Left)  Patient Location: PACU  Anesthesia Type:GA combined with regional for post-op pain  Level of Consciousness: awake and patient cooperative  Airway & Oxygen Therapy: Patient Spontanous Breathing and Patient connected to face mask oxygen  Post-op Assessment: Report given to PACU RN and Post -op Vital signs reviewed and stable  Post vital signs: Reviewed and stable  Complications: No apparent anesthesia complications

## 2014-03-09 NOTE — H&P (Signed)
Jeffrey [name] is an 63 y.o. male.   Chief Complaint: left ankle pain HPI: 63 y/o male with chronic left ankle pain with activity.  He has mri evidence of peroneal tendon tears and tendonitis.  He presents now for surgical treatment.  Past Medical History  Diagnosis Date  . Hypercholesteremia   . Cancer     skin cancer-chest and neck  . GERD (gastroesophageal reflux disease)   . Ventral hernia   . Abdominal distension   . Abdominal pain   . Wears glasses     Past Surgical History  Procedure Laterality Date  . Knee arthroscopy      with bone removal-right leg- 2 yrs ago  . Hernia repair      umbilical hernia [id number]-4-6 yrs ago  . Cystectomy      right thumb 2o yrs  . Mass excision  12/11/2011    Procedure: EXCISION MASS;  Surgeon: Marcheta Grammes, DPM;  Location: [facility];  Service: Orthopedics;  Laterality: Left;  Excision of plantar fibroma  . Cystectomy    . Laparotomy  03/30/2012    Procedure: EXPLORATORY LAPAROTOMY;  Surgeon: Donato Heinz, MD;  Location: [facility];  Service: General;  Laterality: N/A;  . Bowel resection  03/30/2012    Procedure: SMALL BOWEL RESECTION;  Surgeon: Donato Heinz, MD;  Location: [facility];  Service: General;  Laterality: N/A;  . Incisional hernia repair  10/22/2012    Procedure: LAPAROSCOPIC INCISIONAL HERNIA;  Surgeon: Adin Hector, MD;  Location: [facility];  Service: General;  Laterality: N/A;  incarcerated  . Insertion of mesh  10/22/2012    Procedure: INSERTION OF MESH;  Surgeon: Adin Hector, MD;  Location: [facility];  Service: General;  Laterality: N/A;  . Colonoscopy N/A 01/10/2014    Procedure: COLONOSCOPY;  Surgeon: Daneil Dolin, MD;  [facility];  Service: Endoscopy;  Laterality: N/A;  1:30 PM    Family History  Problem Relation Age of Onset  . Anesthesia problems Neg Hx   . Hypotension Neg Hx   . Malignant hyperthermia Neg Hx   . Pseudochol deficiency Neg Hx   . Cancer Mother     breast  . Stroke Mother    Social  History:  reports that he has never smoked. He has never used smokeless tobacco. He reports that he does not drink alcohol or use illicit drugs.  Allergies: No Known Allergies  Medications Prior to Admission  Medication Sig Dispense Refill  . aspirin EC 81 MG tablet Take 81 mg by mouth at bedtime.       . fluticasone (FLONASE) 50 MCG/ACT nasal spray Place 1 spray into the nose daily as needed. Allergies      . omeprazole-sodium bicarbonate (ZEGERID) 40-1100 MG per capsule Take 1 capsule by mouth daily as needed (for acid reflux).       . rosuvastatin (CRESTOR) 10 MG tablet Take 10 mg by mouth at bedtime.         Results for orders placed during the hospital encounter of 03/09/14 (from the past 48 hour(s))  POCT HEMOGLOBIN-HEMACUE     Status: None   Collection Time    03/09/14  6:49 AM      Result Value Ref Range   Hemoglobin 13.6  13.0 - 17.0 g/dL   No results found.  ROS  No recent f/c/n/v/wt loss  Blood pressure 150/79, pulse 70, temperature 98.5 F (36.9 C), temperature source Oral, resp. rate 20, height 5'  8" (1.727 m), weight 90.719 kg (200 lb), SpO2 97.00%. Physical Exam  wn wd male in nad.  A and O x 4.  Mood and affect normal.  EOMI.  Resp unlabored.  L ankle with healthy skin.  Normal sens to LT.  5/5 strength in PF, DF, inversion and eversion.  Resisted eversion testing reproduces his pain. Assessment/Plan L ankle peroneal tendon tears and tendonitis - to OR for tenolysis and repair as needed.  The risks and benefits of the alternative treatment options have been discussed in detail.  The patient wishes to proceed with surgery and specifically understands risks of bleeding, infection, nerve damage, blood clots, need for additional surgery, amputation and death.   Wylene Simmer Mar 29, 2014, 7:18 AM

## 2014-03-10 ENCOUNTER — Encounter (HOSPITAL_BASED_OUTPATIENT_CLINIC_OR_DEPARTMENT_OTHER): Payer: Self-pay | Admitting: Orthopedic Surgery

## 2014-04-06 ENCOUNTER — Telehealth: Payer: Self-pay

## 2014-04-06 NOTE — Telephone Encounter (Signed)
Agree. thanks

## 2014-04-06 NOTE — Telephone Encounter (Signed)
Per Dr. Gala Romney, I called in [facility] suppositories #10, to use one nightly with no refills.  Rx called to [place] [facility] in [place].   I also called and informed pt of the prescription and the other instructions.   OTC Colace 100 mg bid and continue Senocot.

## 2014-05-11 ENCOUNTER — Ambulatory Visit (HOSPITAL_COMMUNITY)
Admission: RE | Admit: 2014-05-11 | Discharge: 2014-05-11 | Disposition: A | Discharge status: Home / Self Care | Payer: 59 | Source: Ambulatory Visit | Attending: Orthopedic Surgery | Admitting: Orthopedic Surgery

## 2014-05-11 DIAGNOSIS — IMO0001 Reserved for inherently not codable concepts without codable children: Secondary | ICD-10-CM | POA: Diagnosis present

## 2014-05-11 DIAGNOSIS — M25579 Pain in unspecified ankle and joints of unspecified foot: Secondary | ICD-10-CM | POA: Insufficient documentation

## 2014-05-11 DIAGNOSIS — M6281 Muscle weakness (generalized): Secondary | ICD-10-CM

## 2014-05-11 DIAGNOSIS — M25473 Effusion, unspecified ankle: Secondary | ICD-10-CM | POA: Insufficient documentation

## 2014-05-11 DIAGNOSIS — M25676 Stiffness of unspecified foot, not elsewhere classified: Secondary | ICD-10-CM

## 2014-05-11 DIAGNOSIS — M25476 Effusion, unspecified foot: Secondary | ICD-10-CM | POA: Diagnosis not present

## 2014-05-11 DIAGNOSIS — M25572 Pain in left ankle and joints of left foot: Secondary | ICD-10-CM

## 2014-05-11 DIAGNOSIS — M25673 Stiffness of unspecified ankle, not elsewhere classified: Secondary | ICD-10-CM | POA: Insufficient documentation

## 2014-05-11 DIAGNOSIS — R262 Difficulty in walking, not elsewhere classified: Secondary | ICD-10-CM | POA: Diagnosis not present

## 2014-05-11 DIAGNOSIS — R269 Unspecified abnormalities of gait and mobility: Secondary | ICD-10-CM | POA: Diagnosis not present

## 2014-05-11 NOTE — Evaluation (Signed)
Physical Therapy Evaluation  Patient Details  Name: Jeffrey [name] MRN: [id number] Date of Birth: 03/29/1951  Today's Date: 05/11/2014 Time: 9678-9381 PT Time Calculation (min): 45 min     Charges: 1 Evaluation, TherEx [phone], Manual 920-930         Visit#: 1 of 8  Re-eval: 06/10/14 Assessment Diagnosis: Lt foot pain and stiffness followign Peroneus brevis reconstruction Surgical Date: 03/09/14 Next MD Visit: Doran Durand August 5th  Prior Therapy: Cheryle Horsfall, prior to surgery, patient is now WBAT  Authorization: [facility]     Past Medical History:  Past Medical History  Diagnosis Date  . Hypercholesteremia   . Cancer     skin cancer-chest and neck  . GERD (gastroesophageal reflux disease)   . Ventral hernia   . Abdominal distension   . Abdominal pain   . Wears glasses    Past Surgical History:  Past Surgical History  Procedure Laterality Date  . Knee arthroscopy      with bone removal-right leg- 2 yrs ago  . Hernia repair      umbilical hernia [id number]-2-5 yrs ago  . Cystectomy      right thumb 2o yrs  . Mass excision  12/11/2011    Procedure: EXCISION MASS;  Surgeon: Marcheta Grammes, DPM;  Location: [facility];  Service: Orthopedics;  Laterality: Left;  Excision of plantar fibroma  . Cystectomy    . Laparotomy  03/30/2012    Procedure: EXPLORATORY LAPAROTOMY;  Surgeon: Donato Heinz, MD;  Location: [facility];  Service: General;  Laterality: N/A;  . Bowel resection  03/30/2012    Procedure: SMALL BOWEL RESECTION;  Surgeon: Donato Heinz, MD;  Location: [facility];  Service: General;  Laterality: N/A;  . Incisional hernia repair  10/22/2012    Procedure: LAPAROSCOPIC INCISIONAL HERNIA;  Surgeon: Adin Hector, MD;  Location: [facility];  Service: General;  Laterality: N/A;  incarcerated  . Insertion of mesh  10/22/2012    Procedure: INSERTION OF MESH;  Surgeon: Adin Hector, MD;  Location: [facility];  Service: General;  Laterality: N/A;  . Colonoscopy N/A 01/10/2014    Procedure: COLONOSCOPY;   Surgeon: Daneil Dolin, MD;  [facility];  Service: Endoscopy;  Laterality: N/A;  1:30 PM  . Repair of peroneus brevis tendon Left 03/09/2014    Procedure: LEFT PERONEUS BREVIS LONGUS TENOLYSIS AND TENDON REPAIR ;  Surgeon: Wylene Simmer, MD;  Location: [place];  Service: Orthopedics;  Laterality: Left;    Subjective Symptoms/Limitations Symptoms: No pain, numbness and tingling in Lt lateral foot. no pain with awalk Pertinent History: Patient seenrpior to surgery due to lateral ankle pain. Patient had surgery on Lt foot: Left peroneus longus tenolysis, Left peroneus brevis tendon repair, Excision of left calcaneus peroneal tubercle. patient was placed in a hard cast and and now air cast is still worn when aching and in pain.  How long can you walk comfortably?: <17minutes Pain Assessment Currently in Pain?: Yes Pain Score: 6  Pain Location: Foot Pain Orientation: Left Pain Type: Surgical pain Pain Onset: More than a month ago Pain Frequency: Intermittent Pain Relieving Factors: Rest, Ice Effect of Pain on Daily Activities: walking, standing  Cognition/Observation Observation/Other Assessments Observations: pain with Lt LE weight bearing  Sensation/Coordination/Flexibility/Functional Tests Functional Tests Functional Tests: Gait:: decreased stride length when weight baring on Lt LE, decreased arch collapse on Lt secondary to Rt, limited hip mobility   Assessment LLE AROM (degrees) Left Ankle Dorsiflexion: 10 Left Ankle Plantar  Flexion: 22 Left Ankle Inversion: 18 Left Ankle Eversion: 8 LLE Strength Left Ankle Dorsiflexion: 2+/5 Left Ankle Plantar Flexion: 2+/5 Left Ankle Inversion: 2+/5 Left Ankle Eversion: 2+/5 Palpation Palpation: Lt foot: good joint mobiliy overall except with tenderness and pain with calcanea, cuboid and talus mobilizations, tenderness along plantar fascia, and peroneal tendonds/mucles.   Exercise/Treatments Ankle  Stretches Plantar Fascia Stretch: 10 seconds;5 reps Gastroc Stretch: 20 seconds;4 reps Other Stretch: 3D ankle excursion 10x   Manual therapy: Soft tissue mobilization of peroneus brevis, longus and plantar fascia. Grade 1 and 2 joint mobilizations to calcaneal cuboid, subtalar and subcrural joints.   Physical Therapy Assessment and Plan PT Assessment and Plan Clinical Impression Statement: Patient dispalsy abnormal gait and difficulty walking secondary to Lt ankle stiffness, pain and weakness follwoign Lt ankle surgical repair of peroneus brevus tendon. Patient will benefit from skileld phsycial therapy to be able to return to walking withtou difficulty by increases to foot/ankle stability and strength . Pt will benefit from skilled therapeutic intervention in order to improve on the following deficits: Abnormal gait;Difficulty walking;Decreased activity tolerance;Decreased balance;Decreased strength;Decreased range of motion;Impaired flexibility;Pain;Impaired sensation Rehab Potential: Good Clinical Impairments Affecting Rehab Potential: patient displasy a very positive response to surgery and is highly motivated PT Frequency: Min 3X/week PT Duration: 4 weeks PT Treatment/Interventions: Gait training;Stair training;Functional mobility training;Therapeutic activities;Therapeutic exercise;Balance training;Neuromuscular re-education;Modalities;Manual techniques;Patient/family education PT Plan: Initial focus on increasing hip knee and ankle mobility to decrease strain on Lt ankle, following normalization of mobility begin strengtheing of ankle and foot muscles to improve stability for weight bearing. . next session, introduce hip flexor, hamstring, groin, piriformis and ITband stretches next session to improve LE mobility as part of HEP,.    Goals Home Exercise Program Pt/caregiver will Perform Home Exercise Program: For increased ROM PT Goal: Perform Home Exercise Program - Progress: Goal set  today PT Short Term Goals Time to Complete Short Term Goals: 4 weeks PT Short Term Goal 1: Patient will increase Lt ankle dorsiflexon to 20 degrees to be able walk withotu early heel rise PT Short Term Goal 2: Patient will be able to Lt plantar flex foot to 50 degrees to be able to stand on toes to reach to a high shelf PT Short Term Goal 3: Patient will be display increased Lt ankle inversion to 40 degrees and eversion to 20 degrees to indicate improved ankel mobility for gait.  PT Short Term Goal 4: Patient will display ankle  dorsiflexion and plantar flexion to 4/5 MMT to indicate increased nkle strength and stability PT Short Term Goal 5: Patient will be able o walk for 46minutes without pain.   Problem List Patient Active Problem List   Diagnosis Date Noted  . Pain in joint, ankle and foot 05/11/2014  . Stiffness of joint, not elsewhere classified, ankle and foot 05/11/2014  . Muscle weakness (generalized) 05/11/2014  . Peroneal tendonitis of left lower extremity 12/20/2013  . Incisional hernia 10/04/2012  . Abdominal pain 03/30/2012  . Perforated bowel 03/30/2012  . Leukocytosis 03/30/2012  . Hyperlipidemia 03/30/2012  . GERD (gastroesophageal reflux disease) 03/30/2012    PT - End of Session Activity Tolerance: Patient tolerated treatment well General Behavior During Therapy: WFL for tasks assessed/performed PT Plan of Care PT Home Exercise Plan: 3D ankle excursions, calf stretch and plantar fascia stretch Consulted and Agree with Plan of Care: Patient  GP Functional Assessment Tool Used: FOTZO: 58% limited Functional Limitation: Mobility: Walking and moving around Mobility: Walking and Moving Around Current Status (  [id number]): At least 40 percent but less than 60 percent impaired, limited or restricted Mobility: Walking and Moving Around Goal Status [phone]): At least 20 percent but less than 40 percent impaired, limited or restricted  Leia Alf 05/11/2014, 12:19  PM  Physician Documentation Your signature is required to indicate approval of the treatment plan as stated above.  Please sign and either send electronically or make a copy of this report for your files and return this physician signed original.   Please mark one 1.__approve of plan  2. ___approve of plan with the following conditions.   ______________________________                                                          _____________________ Physician Signature                                                                                                             Date

## 2014-05-16 ENCOUNTER — Ambulatory Visit (HOSPITAL_COMMUNITY)
Admission: RE | Admit: 2014-05-16 | Discharge: 2014-05-16 | Disposition: A | Discharge status: Home / Self Care | Payer: 59 | Source: Ambulatory Visit | Attending: Orthopedic Surgery | Admitting: Orthopedic Surgery

## 2014-05-16 DIAGNOSIS — IMO0001 Reserved for inherently not codable concepts without codable children: Secondary | ICD-10-CM | POA: Diagnosis not present

## 2014-05-16 NOTE — Progress Notes (Signed)
Physical Therapy Treatment Patient Details  Name: AARUSH STUKEY MRN: [id number] Date of Birth: 1951/04/21  Today's Date: 05/16/2014 Time: 8413-2440 PT Time Calculation (min): 29 min Charge: TE 1027-2536, Manual 6440-3474    Visit#: 2 of 8  Re-eval: 06/10/14 Assessment Diagnosis: Lt foot pain and stiffness followign Peroneus brevis reconstruction Surgical Date: 03/09/14 Next MD Visit: Doran Durand August 5th  Prior Therapy: Cheryle Horsfall, prior to surgery, patient is now WBAT  Authorization: [employer]  Authorization Time Period:    Authorization Visit#:   of     Subjective: Symptoms/Limitations Symptoms: Pt stated compliance with HEP, current Lt foot is sore today current pain scale 4/10 orless. Pain Assessment Currently in Pain?: Yes Pain Score: 4  Pain Location: Foot Pain Orientation: Left  Objective:  Exercise/Treatments Ankle Stretches Plantar Fascia Stretch: 3 reps;20 seconds Gastroc Stretch: 3 reps;30 seconds;Limitations Gastroc Stretch Limitations: slant board Other Stretch: 3D ankle excursion 10x  Other Stretch: Hamstring 3 directions on 12in step 3x 30"; IT Band 4in step 3x 20"; hip flexion st 3x 20" and groin stretch 3x 20", piriformis seated 3way 3x 30"    Manual Therapy Manual Therapy: Myofascial release Myofascial Release: MFR to foot, focus on lateral aspect and jmetatarsal oint mobs   Physical Therapy Assessment and Plan PT Assessment and Plan Clinical Impression Statement: Began PT POC introducing stretche.  Pt able to follow demonstration with minimal cueing required for form.  Pt given HEP worksheet with new stretches.  Manual techniques complete at end of session to reduce fascial restrictions on lateral aspect of foot and to improve ankle mobilty.  Pt reported relief following with improved gait mechanics noted.   PT Plan: Continue with focus on improvinig hip, knee and ankle mobilty to decrease strain on ankle.Marland Kitchen  Next session begin 3D hip excursion and rocker board to  improve gait mechanics.      Goals PT Short Term Goals PT Short Term Goal 1: Patient will increase Lt ankle dorsiflexon to 20 degrees to be able walk withotu early heel rise PT Short Term Goal 1 - Progress: Progressing toward goal PT Short Term Goal 2: Patient will be able to Lt plantar flex foot to 50 degrees to be able to stand on toes to reach to a high shelf PT Short Term Goal 2 - Progress: Progressing toward goal PT Short Term Goal 3: Patient will be display increased Lt ankle inversion to 40 degrees and eversion to 20 degrees to indicate improved ankel mobility for gait.  PT Short Term Goal 3 - Progress: Progressing toward goal PT Short Term Goal 4: Patient will display ankle  dorsiflexion and plantar flexion to 4/5 MMT to indicate increased nkle strength and stability PT Short Term Goal 4 - Progress: Progressing toward goal PT Short Term Goal 5: Patient will be able to walk for 86minutes without pain.  PT Short Term Goal 5 - Progress: Progressing toward goal  Problem List Patient Active Problem List   Diagnosis Date Noted  . Pain in joint, ankle and foot 05/11/2014  . Stiffness of joint, not elsewhere classified, ankle and foot 05/11/2014  . Muscle weakness (generalized) 05/11/2014  . Peroneal tendonitis of left lower extremity 12/20/2013  . Incisional hernia 10/04/2012  . Abdominal pain 03/30/2012  . Perforated bowel 03/30/2012  . Leukocytosis 03/30/2012  . Hyperlipidemia 03/30/2012  . GERD (gastroesophageal reflux disease) 03/30/2012    PT - End of Session Activity Tolerance: Patient tolerated treatment well General Behavior During Therapy: [facility] for tasks assessed/performed PT Plan of Care  PT Home Exercise Plan: LE stretches  GP    Aldona Lento 05/16/2014, 1:22 PM

## 2014-05-18 ENCOUNTER — Ambulatory Visit (HOSPITAL_COMMUNITY)
Admission: RE | Admit: 2014-05-18 | Discharge: 2014-05-18 | Disposition: A | Discharge status: Home / Self Care | Payer: 59 | Source: Ambulatory Visit | Attending: Orthopedic Surgery | Admitting: Orthopedic Surgery

## 2014-05-18 DIAGNOSIS — IMO0001 Reserved for inherently not codable concepts without codable children: Secondary | ICD-10-CM | POA: Diagnosis not present

## 2014-05-18 NOTE — Progress Notes (Signed)
Physical Therapy Treatment Patient Details  Name: Jeffrey [name] MRN: [id number] Date of Birth: [birth year]  Today's Date: 05/18/2014 Time: 4034-7425 PT Time Calculation (min): 45 min Charge: TE 9563-8756, 4332-9518  Visit#: 3 of 8  Re-eval: 06/10/14 Assessment Diagnosis: Lt foot pain and stiffness followign Peroneus brevis reconstruction Surgical Date: 03/09/14 Next MD Visit: Doran Durand August 5th  Prior Therapy: Yeas, prior to surgery, patient is now WBAT  Authorization: [employer]  Authorization Time Period:    Authorization Visit#:   of     Subjective: Symptoms/Limitations Symptoms: Pt compliant with HEP, current pain scale 3/10 Pain Assessment Currently in Pain?: Yes Pain Score: 3  Pain Location: Foot Pain Orientation: Left  Objective:   Exercise/Treatments Ankle Stretches Plantar Fascia Stretch: 3 reps;20 seconds Soleus Stretch: 3 reps;30 seconds;Limitations Soleus Stretch Limitations: slant board Gastroc Stretch: 3 reps;30 seconds;Limitations Gastroc Stretch Limitations: slant board Other Stretch: 3D ankle excursion 10x  Other Stretch: Hamstring 3 directions on 12in step 3x 30"; IT Band 4in step 3x 20"; hip flexion st 3x 20" and groin stretch 3x 20", piriformis seated 3way 3x 30" Ankle Exercises - Standing Rocker Board: 2 minutes;Limitations Rocker Board Limitations: R/L and A/P Other Standing Ankle Exercises: 3D hip excursion    Manual Therapy Manual Therapy: Myofascial release Myofascial Release: MFR to foot, focus on lateral aspect and metatarsal joint mobs   Physical Therapy Assessment and Plan PT Assessment and Plan Clinical Impression Statement: Pt with proper form with all stretches.  Continued with stretches to improve anklr and hip mobilty.  Added 3D hip excursion and rockerboard to improve hip mobilty and weight distribution with gait.  Manual techniques complete at end of session to reduce fascial restricions on lateral aspect of foot.  Pt stated pain  relieved following manual   PT Plan: Continue with focus on improvinig hip, knee and ankle mobilty to decrease strain on ankle..      Goals PT Short Term Goals PT Short Term Goal 1: Patient will increase Lt ankle dorsiflexon to 20 degrees to be able walk withotu early heel rise PT Short Term Goal 1 - Progress: Progressing toward goal PT Short Term Goal 2: Patient will be able to Lt plantar flex foot to 50 degrees to be able to stand on toes to reach to a high shelf PT Short Term Goal 2 - Progress: Progressing toward goal PT Short Term Goal 3: Patient will be display increased Lt ankle inversion to 40 degrees and eversion to 20 degrees to indicate improved ankel mobility for gait.  PT Short Term Goal 3 - Progress: Progressing toward goal PT Short Term Goal 4: Patient will display ankle  dorsiflexion and plantar flexion to 4/5 MMT to indicate increased nkle strength and stability PT Short Term Goal 4 - Progress: Progressing toward goal PT Short Term Goal 5: Patient will be able to walk for 6minutes without pain.  PT Short Term Goal 5 - Progress: Progressing toward goal  Problem List Patient Active Problem List   Diagnosis Date Noted  . Pain in joint, ankle and foot 05/11/2014  . Stiffness of joint, not elsewhere classified, ankle and foot 05/11/2014  . Muscle weakness (generalized) 05/11/2014  . Peroneal tendonitis of left lower extremity 12/20/2013  . Incisional hernia 10/04/2012  . Abdominal pain 03/30/2012  . Perforated bowel 03/30/2012  . Leukocytosis 03/30/2012  . Hyperlipidemia 03/30/2012  . GERD (gastroesophageal reflux disease) 03/30/2012    PT - End of Session Activity Tolerance: Patient tolerated treatment well General Behavior During Therapy: [facility]  for tasks assessed/performed  GP    Aldona Lento 05/18/2014, 1:11 PM

## 2014-05-23 ENCOUNTER — Telehealth: Payer: Self-pay | Admitting: Internal Medicine

## 2014-05-23 NOTE — Telephone Encounter (Signed)
Pt is on the AUG recall to have repeat IFOBT in one year. Do I need to make him an OV for this?

## 2014-05-23 NOTE — Telephone Encounter (Signed)
No. We will mail it to him. Thanks!

## 2014-05-24 NOTE — Telephone Encounter (Signed)
ifobt mailed to the pt.

## 2014-05-30 ENCOUNTER — Ambulatory Visit (HOSPITAL_COMMUNITY): Payer: 59 | Admitting: Physical Therapy

## 2014-06-01 ENCOUNTER — Ambulatory Visit (HOSPITAL_COMMUNITY)
Admission: RE | Admit: 2014-06-01 | Discharge: 2014-06-01 | Disposition: A | Discharge status: Home / Self Care | Payer: 59 | Source: Ambulatory Visit | Attending: Internal Medicine | Admitting: Internal Medicine

## 2014-06-01 ENCOUNTER — Ambulatory Visit (INDEPENDENT_AMBULATORY_CARE_PROVIDER_SITE_OTHER): Payer: 59

## 2014-06-01 DIAGNOSIS — R262 Difficulty in walking, not elsewhere classified: Secondary | ICD-10-CM | POA: Diagnosis not present

## 2014-06-01 DIAGNOSIS — M25579 Pain in unspecified ankle and joints of unspecified foot: Secondary | ICD-10-CM | POA: Insufficient documentation

## 2014-06-01 DIAGNOSIS — R269 Unspecified abnormalities of gait and mobility: Secondary | ICD-10-CM | POA: Diagnosis not present

## 2014-06-01 DIAGNOSIS — IMO0001 Reserved for inherently not codable concepts without codable children: Secondary | ICD-10-CM | POA: Diagnosis not present

## 2014-06-01 DIAGNOSIS — M25476 Effusion, unspecified foot: Secondary | ICD-10-CM | POA: Insufficient documentation

## 2014-06-01 DIAGNOSIS — M6281 Muscle weakness (generalized): Secondary | ICD-10-CM | POA: Diagnosis not present

## 2014-06-01 DIAGNOSIS — Z129 Encounter for screening for malignant neoplasm, site unspecified: Secondary | ICD-10-CM

## 2014-06-01 DIAGNOSIS — M25473 Effusion, unspecified ankle: Secondary | ICD-10-CM | POA: Diagnosis not present

## 2014-06-01 LAB — IFOBT (OCCULT BLOOD): IMMUNOLOGICAL FECAL OCCULT BLOOD TEST: NEGATIVE

## 2014-06-01 NOTE — Progress Notes (Signed)
Physical Therapy Treatment Patient Details  Name: JAMARIEN RODKEY MRN: [id number] Date of Birth: 11-Aug-1951  Today's Date: 06/01/2014 Time: 5284-1324 PT Time Calculation (min): 44 min  Visit#: 4 of 8  Re-eval: 06/10/14 Authorization: [facility]  Charges:  therex [phone] (30'), manual [phone] (12')  Subjective: Symptoms/Limitations Symptoms: Pt states he still has the numbness in his lateral foot to his little toe.  States mild discomfort. Pain Assessment Currently in Pain?: Yes Pain Score: 2  Pain Location: Foot Pain Orientation: Left   Exercise/Treatments Ankle Stretches Plantar Fascia Stretch: 3 reps;20 seconds Soleus Stretch: 3 reps;30 seconds;Limitations Soleus Stretch Limitations: slant board Gastroc Stretch: 3 reps;30 seconds;Limitations Gastroc Stretch Limitations: slant board Other Stretch: 3D ankle excursion 10x  Other Stretch: Hamstring 3 directions on 12in step 3x 30"; IT Band 4in step 3x 20"; hip flexion st 3x 20" and groin stretch 3x 20", piriformis seated 3way 3x 30" Ankle Exercises - Standing Rocker Board: 2 minutes;Limitations Rocker Board Limitations: R/L and A/P    Manual Therapy Manual Therapy: Myofascial release Myofascial Release: MFR to foot, focus on lateral aspect and metatarsal joint mobs   Physical Therapy Assessment and Plan PT Assessment and Plan Clinical Impression Statement:  Continued with stretches to improve LE mobilty. Manual techniques complete at end of session to reduce fascial restrictions on lateral aspect of foot.  Most adhesions around lateral scar.   Pt stated pain relieved following manual PT Plan: Continue with focus on improvinig hip, knee and ankle mobilty to decrease strain on ankle..       Problem List Patient Active Problem List   Diagnosis Date Noted  . Pain in joint, ankle and foot 05/11/2014  . Stiffness of joint, not elsewhere classified, ankle and foot 05/11/2014  . Muscle weakness (generalized) 05/11/2014  . Peroneal  tendonitis of left lower extremity 12/20/2013  . Incisional hernia 10/04/2012  . Abdominal pain 03/30/2012  . Perforated bowel 03/30/2012  . Leukocytosis 03/30/2012  . Hyperlipidemia 03/30/2012  . GERD (gastroesophageal reflux disease) 03/30/2012    PT - End of Session Activity Tolerance: Patient tolerated treatment well General Behavior During Therapy: WFL for tasks assessed/performed   Teena Irani, PTA/CLT 06/01/2014, 9:37 AM

## 2014-06-01 NOTE — Progress Notes (Signed)
IFOBT  Test Results (-) Negative

## 2014-06-06 ENCOUNTER — Ambulatory Visit (HOSPITAL_COMMUNITY)
Admission: RE | Admit: 2014-06-06 | Discharge: 2014-06-06 | Disposition: A | Discharge status: Home / Self Care | Payer: 59 | Source: Ambulatory Visit | Attending: Internal Medicine | Admitting: Internal Medicine

## 2014-06-06 DIAGNOSIS — IMO0001 Reserved for inherently not codable concepts without codable children: Secondary | ICD-10-CM | POA: Diagnosis not present

## 2014-06-06 NOTE — Progress Notes (Signed)
Physical Therapy Treatment Patient Details  Name: Jeffrey [name] MRN: [id number] Date of Birth: February 07, 1951  Today's Date: 06/06/2014 Time: 9628-3662 PT Time Calculation (min): 45 min  Visit#: 5 of 8  Re-eval: 06/10/14 Authorization: [facility]  Charges:  therex [phone] (35'), manual [phone] (10')  Subjective: Symptoms/Limitations Symptoms: Pt states the numbness is decreasing in his lateral Lt foot.  States his Rt foot has been bothering him more than his Lt one. Pain Assessment Currently in Pain?: No/denies   Exercise/Treatments Ankle Stretches Soleus Stretch: 3 reps;30 seconds;Limitations Soleus Stretch Limitations: slant board Gastroc Stretch: 3 reps;30 seconds;Limitations Gastroc Stretch Limitations: slant board Other Stretch: 3D ankle excursion 10x  Other Stretch: time:  Hamstring 3 directions on 12in step 3x 30"; IT Band 4in step 3x 20"; hip flexion st 3x 20" and groin stretch 3x 20", piriformis seated 3way 3x 30" Ankle Exercises - Standing Rocker Board: 2 minutes;Limitations Rocker Board Limitations: R/L and A/P    Manual Therapy Manual Therapy: Myofascial release Myofascial Release: MFR to foot, focus on lateral aspect and metatarsal joint mobs   Physical Therapy Assessment and Plan PT Assessment and Plan Clinical Impression Statement: Overall improving with increased independence with stretches.  Reduced scar tissue noted with manual techniques today and good foot mobility.  No pain at end of session. PT Plan: Continue with focus on improvinig hip, knee and ankle mobilty to decrease strain on ankle..       Problem List Patient Active Problem List   Diagnosis Date Noted  . Pain in joint, ankle and foot 05/11/2014  . Stiffness of joint, not elsewhere classified, ankle and foot 05/11/2014  . Muscle weakness (generalized) 05/11/2014  . Peroneal tendonitis of left lower extremity 12/20/2013  . Incisional hernia 10/04/2012  . Abdominal pain 03/30/2012  . Perforated  bowel 03/30/2012  . Leukocytosis 03/30/2012  . Hyperlipidemia 03/30/2012  . GERD (gastroesophageal reflux disease) 03/30/2012    PT - End of Session Activity Tolerance: Patient tolerated treatment well General Behavior During Therapy: [facility] for tasks assessed/performed  GP    Teena Irani, PTA/CLT 06/06/2014, 9:34 AM

## 2014-06-08 ENCOUNTER — Ambulatory Visit (HOSPITAL_COMMUNITY)
Admission: RE | Admit: 2014-06-08 | Discharge: 2014-06-08 | Disposition: A | Discharge status: Home / Self Care | Payer: 59 | Source: Ambulatory Visit | Attending: Internal Medicine | Admitting: Internal Medicine

## 2014-06-08 DIAGNOSIS — IMO0001 Reserved for inherently not codable concepts without codable children: Secondary | ICD-10-CM | POA: Diagnosis not present

## 2014-06-08 NOTE — Evaluation (Signed)
Physical Therapy Evaluation  Patient Details  Name: Jeffrey [name] MRN: [id number] Date of Birth: 1950/12/14  Today's Date: 06/08/2014 Time: 0865-7846 PT Time Calculation (min): 43 min     Charges: TherEx [phone]         Visit#: 6 of   8 Re-eval: 07/08/14 Assessment Diagnosis: Lt foot pain and stiffness followign Peroneus brevis reconstruction Surgical Date: 03/09/14 Next MD Visit: Doran Durand August 5th  Prior Therapy: Cheryle Horsfall, prior to surgery, patient is now WBAT  Authorization: [facility]     Past Medical History:  Past Medical History  Diagnosis Date  . Hypercholesteremia   . Cancer     skin cancer-chest and neck  . GERD (gastroesophageal reflux disease)   . Ventral hernia   . Abdominal distension   . Abdominal pain   . Wears glasses    Past Surgical History:  Past Surgical History  Procedure Laterality Date  . Knee arthroscopy      with bone removal-right leg- 2 yrs ago  . Hernia repair      umbilical hernia [id number]-4-0 yrs ago  . Cystectomy      right thumb 2o yrs  . Mass excision  12/11/2011    Procedure: EXCISION MASS;  Surgeon: Marcheta Grammes, DPM;  Location: [facility];  Service: Orthopedics;  Laterality: Left;  Excision of plantar fibroma  . Cystectomy    . Laparotomy  03/30/2012    Procedure: EXPLORATORY LAPAROTOMY;  Surgeon: Donato Heinz, MD;  Location: [facility];  Service: General;  Laterality: N/A;  . Bowel resection  03/30/2012    Procedure: SMALL BOWEL RESECTION;  Surgeon: Donato Heinz, MD;  Location: [facility];  Service: General;  Laterality: N/A;  . Incisional hernia repair  10/22/2012    Procedure: LAPAROSCOPIC INCISIONAL HERNIA;  Surgeon: Adin Hector, MD;  Location: [facility];  Service: General;  Laterality: N/A;  incarcerated  . Insertion of mesh  10/22/2012    Procedure: INSERTION OF MESH;  Surgeon: Adin Hector, MD;  Location: [facility];  Service: General;  Laterality: N/A;  . Colonoscopy N/A 01/10/2014    Procedure: COLONOSCOPY;  Surgeon: Daneil Dolin,  MD;  Location: [facility];  Service: Endoscopy;  Laterality: N/A;  1:30 PM  . Repair of peroneus brevis tendon Left 03/09/2014    Procedure: LEFT PERONEUS BREVIS LONGUS TENOLYSIS AND TENDON REPAIR ;  Surgeon: Wylene Simmer, MD;  Location: [place];  Service: Orthopedics;  Laterality: Left;    Subjective Symptoms/Limitations Symptoms: Patient state no pain in Lt foot today except tenderness alon lateral boarder of 5th metatarsal with testing. Noted increased pain in rt foot secondary to likely compensation for weight bearing Pain Assessment Currently in Pain?: No/denies  Sensation/Coordination/Flexibility/Functional Tests Functional Tests Functional Tests: Gait: noted good mechanics in Right foot minor pain when ambulating on heels, Rt foot displays excessive toe out.  Functional Tests: Single leg balance reach matrix: Lt 49cm, 68cm, 67 cm (painful), Rt 60,78, 67 (painful) cm  (measurements give in: sagittal, Frontal, and transverse plan)  Assessment LLE AROM (degrees) Left Ankle Dorsiflexion: 2 Left Ankle Plantar Flexion: 40 Left Ankle Inversion: 40 Left Ankle Eversion: 15 LLE Strength Left Ankle Dorsiflexion: 4/5 Left Ankle Plantar Flexion: 2+/5 Left Ankle Inversion: 4/5 Left Ankle Eversion: 3+/5  Exercise/Treatments Ankle Stretches Other Stretch: 3D ankle excursion  at wall10x  Other Stretch: Standing 3D ankle excursion on Airex Ankle Exercises - Standing Rocker Board: 2 minutes;Limitations Rocker Board Limitations: [name]/L and A/P Other Standing Ankle Exercises:  Walking on heels, medial side of foot, lateral side of foot. Other Standing Ankle Exercises: Single leg balance reach matrix common 5x  Physical Therapy Assessment and Plan PT Assessment and Plan Clinical Impression Statement: Reassessment completed. Patient is making steady progress towards goals. ROM improvedthorughotu except with ankle dorsiflexion. Patient notes today that prior to the first  sessionhe consumed 3 advil and was better able to tolerate exercises and flaired up following. Since then he has saved pain medication for post  PT. patient is in agreeance for need of continued PT which is recomemnding to reach all goals  PT Plan: Continue skileld PT 2x a week for 58more weeks to reach all goals with focus on improving ankle mobility and stability. Next session at lunge matrix common, standing 3 way heel raises (toe in, out, nuetral) and 3 way flamengo.     Goals PT Short Term Goals PT Short Term Goal 1: Patient will increase Lt ankle dorsiflexon to 20 degrees to be able walk withotu early heel rise PT Short Term Goal 1 - Progress: Progressing toward goal PT Short Term Goal 2: Patient will be able to Lt plantar flex foot to 50 degrees to be able to stand on toes to reach to a high shelf PT Short Term Goal 2 - Progress: Progressing toward goal PT Short Term Goal 3: Patient will be display increased Lt ankle inversion to 40 degrees and eversion to 20 degrees to indicate improved ankel mobility for gait.  PT Short Term Goal 3 - Progress: Met PT Short Term Goal 4: Patient will display ankle  dorsiflexion and plantar flexion to 4/5 MMT to indicate increased nkle strength and stability PT Short Term Goal 4 - Progress: Partly met PT Short Term Goal 5: Patient will be able to walk for 63minutes without pain.  PT Short Term Goal 5 - Progress: Progressing toward goal  Problem List Patient Active Problem List   Diagnosis Date Noted  . Pain in joint, ankle and foot 05/11/2014  . Stiffness of joint, not elsewhere classified, ankle and foot 05/11/2014  . Muscle weakness (generalized) 05/11/2014  . Peroneal tendonitis of left lower extremity 12/20/2013  . Incisional hernia 10/04/2012  . Abdominal pain 03/30/2012  . Perforated bowel 03/30/2012  . Leukocytosis 03/30/2012  . Hyperlipidemia 03/30/2012  . GERD (gastroesophageal reflux disease) 03/30/2012    PT - End of Session Activity  Tolerance: Patient tolerated treatment well General Behavior During Therapy: WFL for tasks assessed/performed PT Plan of Care PT Home Exercise Plan: LE stretches, 3D ankle excursion at wall and single leg balance reach matrix common  GP    Jeffrey [name] 06/08/2014, 9:40 AM  Physician Documentation Your signature is required to indicate approval of the treatment plan as stated above.  Please sign and either send electronically or make a copy of this report for your files and return this physician signed original.   Please mark one 1.__approve of plan  2. ___approve of plan with the following conditions.   ______________________________                                                          _____________________ Physician Signature  Date  

## 2014-06-13 ENCOUNTER — Ambulatory Visit (HOSPITAL_COMMUNITY)
Admission: RE | Admit: 2014-06-13 | Discharge: 2014-06-13 | Disposition: A | Discharge status: Home / Self Care | Payer: 59 | Source: Ambulatory Visit | Attending: Internal Medicine | Admitting: Internal Medicine

## 2014-06-13 DIAGNOSIS — IMO0001 Reserved for inherently not codable concepts without codable children: Secondary | ICD-10-CM | POA: Diagnosis not present

## 2014-06-13 NOTE — Progress Notes (Signed)
Physical Therapy Treatment Patient Details  Name: Jeffrey [name] MRN: [id number] Date of Birth: 11/03/1950  Today's Date: 06/13/2014 Time: 6440-3474 PT Time Calculation (min): 53 min Charge: TE [id number], Manual [id number]  Visit#: 7 of 10  Re-eval: 07/08/14 Assessment Diagnosis: Lt foot pain and stiffness followign Peroneus brevis reconstruction Surgical Date: 03/09/14 Next MD Visit: Doran Durand unscheduled Prior Therapy: Yeas, prior to surgery, patient is now WBAT  Authorization: [employer]  Authorization Time Period:    Authorization Visit#:   of     Subjective: Symptoms/Limitations Symptoms: Ankle feels good, no real pain just a little sore following exercises.   Pain Assessment Currently in Pain?: No/denies  Objective:   Exercise/Treatments Ankle Stretches Slant Board Stretch: Limitations Slant Board Stretch Limitations: 10x 3" holds Other Stretch: 3D ankle excursion  at wall10x  Ankle Exercises - Standing BAPS: Standing;Level 3;10 reps;Limitations BAPS Limitations: A/P, R/L,CW, CCW Rocker Board: 2 minutes;Limitations Rocker Board Limitations: R/L and A/P Heel Raises: 15 reps;Limitations Heel Raises Limitations: neurtal, IR and ER Other Standing Ankle Exercises: Walking on heels, medial side of foot, lateral side of foot. Other Standing Ankle Exercises: Single leg balance reach matrix common 10x; Lunge matrix common 10x; flamango     Manual Therapy Manual Therapy: Myofascial release Myofascial Release: MFR to foot, focus on lateral aspect and metatarsal joint mobs   Physical Therapy Assessment and Plan PT Assessment and Plan Clinical Impression Statement: Progressed balance, stability and overall mobility with new standing exercises.  Began lunges for gluteal strengthening and AROM, heel raises 3 directions for gastroc strengthening and flamango exercises for ankle stabiltiy against wall.  Pt able to complete all exercises with min cueing required for form and technique.   Noted slight increase in edema following activties, manual techniques complete to reduce swelling and improve AROM.  Pain free at end of session.   PT Plan: Continue with current POC to reach all goals with focus on improving ankle mobility and stability. Next session at lunge matrix common, standing 3 way heel raises (toe in, out, nuetral) and 3 way flamengo.     Goals PT Short Term Goals PT Short Term Goal 1: Patient will increase Lt ankle dorsiflexon to 20 degrees to be able walk withotu early heel rise PT Short Term Goal 1 - Progress: Progressing toward goal PT Short Term Goal 2: Patient will be able to Lt plantar flex foot to 50 degrees to be able to stand on toes to reach to a high shelf PT Short Term Goal 2 - Progress: Progressing toward goal PT Short Term Goal 3: Patient will be display increased Lt ankle inversion to 40 degrees and eversion to 20 degrees to indicate improved ankel mobility for gait.  PT Short Term Goal 4: Patient will display ankle  dorsiflexion and plantar flexion to 4/5 MMT to indicate increased nkle strength and stability PT Short Term Goal 4 - Progress: Progressing toward goal PT Short Term Goal 5: Patient will be able to walk for 73minutes without pain.  PT Short Term Goal 5 - Progress: Progressing toward goal  Problem List Patient Active Problem List   Diagnosis Date Noted  . Pain in joint, ankle and foot 05/11/2014  . Stiffness of joint, not elsewhere classified, ankle and foot 05/11/2014  . Muscle weakness (generalized) 05/11/2014  . Peroneal tendonitis of left lower extremity 12/20/2013  . Incisional hernia 10/04/2012  . Abdominal pain 03/30/2012  . Perforated bowel 03/30/2012  . Leukocytosis 03/30/2012  . Hyperlipidemia 03/30/2012  . GERD (gastroesophageal reflux disease)  03/30/2012    PT - End of Session Activity Tolerance: Patient tolerated treatment well General Behavior During Therapy: [facility] for tasks assessed/performed  GP    Aldona Lento 06/13/2014, 9:57 AM

## 2014-06-15 ENCOUNTER — Ambulatory Visit (HOSPITAL_COMMUNITY)
Admission: RE | Admit: 2014-06-15 | Discharge: 2014-06-15 | Disposition: A | Discharge status: Home / Self Care | Payer: 59 | Source: Ambulatory Visit | Attending: Physical Therapy | Admitting: Physical Therapy

## 2014-06-15 DIAGNOSIS — IMO0001 Reserved for inherently not codable concepts without codable children: Secondary | ICD-10-CM | POA: Diagnosis not present

## 2014-06-15 NOTE — Progress Notes (Signed)
Physical Therapy Treatment Patient Details  Name: Jeffrey [name] MRN: [id number] Date of Birth: 1951/07/30  Today's Date: 06/15/2014 Time: 0850-0939 PT Time Calculation (min): 49 min  Charges: therEx [phone] manual [phone] Visit#: 8 of 10  Re-eval: 07/08/14 Assessment Diagnosis: Lt foot pain and stiffness followign Peroneus brevis reconstruction Surgical Date: 03/09/14 Next MD Visit: Doran Durand unscheduled Prior Therapy: Yeas, prior to surgery, patient is now WBAT  Authorization: [employer]    Subjective: Symptoms/Limitations Symptoms: Patient notes conitnued minor pain along plantar surface and lateral ankle., notes feeling stronger Pain Assessment Currently in Pain?: Yes Pain Score: 1  Pain Location: Foot Pain Orientation: Left  Exercise/Treatments Ankle Stretches Gastroc Stretch: Limitations Gastroc Stretch Limitations: 10x 3 seconds 3 way at wall Other Stretch: 3D ankle excursion  at wall10x  Ankle Exercises - Standing Heel Raises: Limitations;5 reps Heel Raises Limitations: neurtal, IR and ER, and split stance Other Standing Ankle Exercises: Walking on heels, medial side of foot, lateral side of foot. Other Standing Ankle Exercises: Single leg balance reach matrix common and uncommon 10x;  Lunge matrix common 10x;  flamango  10x  Manual Therapy Myofascial Release: MFR to foot, focus on lateral and plantar aspect and metatarsal and talus joint mobs   Physical Therapy Assessment and Plan PT Assessment and Plan Clinical Impression Statement: Patient displays improving strength and stability though continued minor pain with peroneal specific exercises. Conitnued exercises from last session and progressed single leg balance matrix to common and uncommon directions. manual techniques complete to reduce swelling and improve AROM. Pain free at end of session following manual therapy patient statingi "I feel like i can run again!" PT Plan: Continue with current POC to reach all goals  with focus on improving ankle mobility and stability. Next session progress lunge matrix common to uncommon, perform single leg balance reach matrinx without toe taps, Progress Heel raises to neutral, toes in/out to narrow and wide base of support    Goals PT Short Term Goals PT Short Term Goal 1: Patient will increase Lt ankle dorsiflexon to 20 degrees to be able walk withotu early heel rise PT Short Term Goal 1 - Progress: Progressing toward goal PT Short Term Goal 2: Patient will be able to Lt plantar flex foot to 50 degrees to be able to stand on toes to reach to a high shelf PT Short Term Goal 2 - Progress: Progressing toward goal PT Short Term Goal 4: Patient will display ankle  dorsiflexion and plantar flexion to 4/5 MMT to indicate increased nkle strength and stability PT Short Term Goal 4 - Progress: Progressing toward goal PT Short Term Goal 5: Patient will be able to walk for 65minutes without pain.  PT Short Term Goal 5 - Progress: Progressing toward goal  Problem List Patient Active Problem List   Diagnosis Date Noted  . Pain in joint, ankle and foot 05/11/2014  . Stiffness of joint, not elsewhere classified, ankle and foot 05/11/2014  . Muscle weakness (generalized) 05/11/2014  . Peroneal tendonitis of left lower extremity 12/20/2013  . Incisional hernia 10/04/2012  . Abdominal pain 03/30/2012  . Perforated bowel 03/30/2012  . Leukocytosis 03/30/2012  . Hyperlipidemia 03/30/2012  . GERD (gastroesophageal reflux disease) 03/30/2012    PT - End of Session Activity Tolerance: Patient tolerated treatment well General Behavior During Therapy: [facility] for tasks assessed/performed  GP    Jayde Mcallister R 06/15/2014, 9:48 AM

## 2014-06-20 ENCOUNTER — Ambulatory Visit (HOSPITAL_COMMUNITY)
Admission: RE | Admit: 2014-06-20 | Discharge: 2014-06-20 | Disposition: A | Discharge status: Home / Self Care | Payer: 59 | Source: Ambulatory Visit | Attending: Orthopedic Surgery | Admitting: Orthopedic Surgery

## 2014-06-20 ENCOUNTER — Ambulatory Visit (HOSPITAL_COMMUNITY): Payer: 59 | Admitting: Physical Therapy

## 2014-06-20 DIAGNOSIS — R269 Unspecified abnormalities of gait and mobility: Secondary | ICD-10-CM | POA: Insufficient documentation

## 2014-06-20 DIAGNOSIS — IMO0001 Reserved for inherently not codable concepts without codable children: Secondary | ICD-10-CM | POA: Diagnosis present

## 2014-06-20 DIAGNOSIS — M25473 Effusion, unspecified ankle: Secondary | ICD-10-CM | POA: Diagnosis not present

## 2014-06-20 DIAGNOSIS — M25476 Effusion, unspecified foot: Secondary | ICD-10-CM | POA: Insufficient documentation

## 2014-06-20 DIAGNOSIS — M25579 Pain in unspecified ankle and joints of unspecified foot: Secondary | ICD-10-CM | POA: Insufficient documentation

## 2014-06-20 DIAGNOSIS — R262 Difficulty in walking, not elsewhere classified: Secondary | ICD-10-CM | POA: Diagnosis not present

## 2014-06-20 DIAGNOSIS — M6281 Muscle weakness (generalized): Secondary | ICD-10-CM | POA: Diagnosis not present

## 2014-06-20 NOTE — Progress Notes (Signed)
Physical Therapy Treatment Patient Details  Name: Jeffrey [name] MRN: [id number] Date of Birth: 07-02-1951  Today's Date: 06/20/2014 Time: 0850-0930 PT Time Calculation (min): 40 min 20' TE 15'manual  Visit#: 9 of 10  Re-eval: 07/08/14    Authorization: [employer]  Authorization Time Period:    Authorization Visit#:   of     Subjective: Symptoms/Limitations Symptoms: Patient has no c/o of pain today only stiffness  Precautions/Restrictions     Exercise/Treatments    Ankle Stretches Gastroc Stretch: Limitations Gastroc Stretch Limitations: 10x 3 seconds 3 way at wall Other Stretch: 3D ankle excursion  at wall10x  Ankle Exercises - Standing Heel Raises: Limitations;5 reps Heel Raises Limitations: neurtal, IR and ER, and split stance in both NBOS/WBOS Other Standing Ankle Exercises: Single leg balance reach matrix common and uncommon 10x; Lunge matrix common 10x; flamango  10x    Modalities Modalities: Cryotherapy Manual Therapy Manual Therapy: Myofascial release Myofascial Release: MFR to foot, focus on lateral and plantar aspect and metatarsal and talus joint mobs   Physical Therapy Assessment and Plan PT Assessment and Plan Clinical Impression Statement: Patient contines to display improved strength and stabiitly again with minor pain with peroneal specific exercises toward end of TE. Performed MFR to plantar and lateral aspect of L foot with 5th metatarsal and talus mobs to reduce swelling  and improve ROM. Added SL balance reach matrix without toe taps, HR to neutral toe in/out with both NBOS/WBOS PT Plan: Continue with current POC to reach all goals with focus on improving ankle mobility and stability.     Goals    Problem List Patient Active Problem List   Diagnosis Date Noted  . Pain in joint, ankle and foot 05/11/2014  . Stiffness of joint, not elsewhere classified, ankle and foot 05/11/2014  . Muscle weakness (generalized) 05/11/2014  . Peroneal tendonitis of  left lower extremity 12/20/2013  . Incisional hernia 10/04/2012  . Abdominal pain 03/30/2012  . Perforated bowel 03/30/2012  . Leukocytosis 03/30/2012  . Hyperlipidemia 03/30/2012  . GERD (gastroesophageal reflux disease) 03/30/2012    PT - End of Session Activity Tolerance: Patient tolerated treatment well General Behavior During Therapy: [facility] for tasks assessed/performed  GP    Jadin Kagel, [place] 06/20/2014, 9:47 AM

## 2014-06-22 ENCOUNTER — Ambulatory Visit (HOSPITAL_COMMUNITY)
Admission: RE | Admit: 2014-06-22 | Discharge: 2014-06-22 | Disposition: A | Discharge status: Home / Self Care | Payer: 59 | Source: Ambulatory Visit | Attending: Internal Medicine | Admitting: Internal Medicine

## 2014-06-22 DIAGNOSIS — IMO0001 Reserved for inherently not codable concepts without codable children: Secondary | ICD-10-CM | POA: Diagnosis not present

## 2014-06-22 NOTE — Evaluation (Signed)
Physical Therapy Reassessment Patient Details  Name: Jeffrey [name] MRN: [id number] Date of Birth: Dec 18, 1950  Today's Date: 06/22/2014 Time: 0850-0930 PT Time Calculation (min): 40 min Charge:  Mm test/ROM [id number]; there ex 9:10-9:30             Visit#: 10 of 12  Re-eval:   Assessment Diagnosis: Lt foot pain and stiffness following Peroneus brevis reconstruction Surgical Date: 03/09/14 Next MD Visit: Doran Durand unscheduled Prior Therapy: Yeas, prior to surgery, patient is now WBAT  Past Medical History:  Past Medical History  Diagnosis Date  . Hypercholesteremia   . Cancer     skin cancer-chest and neck  . GERD (gastroesophageal reflux disease)   . Ventral hernia   . Abdominal distension   . Abdominal pain   . Wears glasses    Past Surgical History:  Past Surgical History  Procedure Laterality Date  . Knee arthroscopy      with bone removal-right leg- 2 yrs ago  . Hernia repair      umbilical hernia MHDQQI-2-9 yrs ago  . Cystectomy      right thumb 2o yrs  . Mass excision  12/11/2011    Procedure: EXCISION MASS;  Surgeon: Marcheta Grammes, DPM;  Location: [facility];  Service: Orthopedics;  Laterality: Left;  Excision of plantar fibroma  . Cystectomy    . Laparotomy  03/30/2012    Procedure: EXPLORATORY LAPAROTOMY;  Surgeon: Donato Heinz, MD;  Location: [facility];  Service: General;  Laterality: N/A;  . Bowel resection  03/30/2012    Procedure: SMALL BOWEL RESECTION;  Surgeon: Donato Heinz, MD;  Location: [facility];  Service: General;  Laterality: N/A;  . Incisional hernia repair  10/22/2012    Procedure: LAPAROSCOPIC INCISIONAL HERNIA;  Surgeon: Adin Hector, MD;  Location: [facility];  Service: General;  Laterality: N/A;  incarcerated  . Insertion of mesh  10/22/2012    Procedure: INSERTION OF MESH;  Surgeon: Adin Hector, MD;  Location: [facility];  Service: General;  Laterality: N/A;  . Colonoscopy N/A 01/10/2014    Procedure: COLONOSCOPY;  Surgeon: Daneil Dolin, MD;   [facility];  Service: Endoscopy;  Laterality: N/A;  1:30 PM  . Repair of peroneus brevis tendon Left 03/09/2014    Procedure: LEFT PERONEUS BREVIS LONGUS TENOLYSIS AND TENDON REPAIR ;  Surgeon: Wylene Simmer, MD;  Location: [place];  Service: Orthopedics;  Laterality: Left;    Subjective Symptoms/Limitations Symptoms: My foot has been doing great.  Pt states that his Rt heel is actually bothering him more than his Lt ankle.  How long can you stand comfortably?: Less than 15 minutes  How long can you walk comfortably?: Pt is able to walk on TM for 15 minutes now. Walking without crutches now was ambulating with crutches.  Pain Assessment Currently in Pain?: Yes Pain Score: 1  Pain Location: Ankle Pain Orientation: Left    Assessment LLE AROM (degrees) Left Ankle Dorsiflexion: 7 (was 2 on 8/20) Left Ankle Plantar Flexion: 40 Left Ankle Inversion: 30 Left Ankle Eversion: 12 LLE Strength Left Ankle Dorsiflexion: 4/5 Left Ankle Plantar Flexion: 4/5 (was 2+/5 ) Left Ankle Inversion:  (5-/5 was 4/5) Left Ankle Eversion: 4/5 (was 3+/5)  Exercise/Treatments Mobility/Balance  Static Standing Balance Single Leg Stance - Right Leg: 60 Single Leg Stance - Left Leg: 24   Ankle Stretches Soleus Stretch: 2 reps;30 seconds Slant Board Stretch Limitations: 3 direction @ 30 " x 1   Ankle  Exercises - Standing BAPS: Level 3;Standing;10 reps SLS: x 24" x 2 Ankle Eversion: 10 reps;Weights Ankle Eversion Weights (lbs): 4    Physical Therapy Assessment and Plan PT Assessment and Plan Clinical Impression Statement: Pt progressing well with strength and ROM.  Main deficiit is dorsiflexion and balance.  Pt will be out of town next week but requests to be seen for two more treatments to make sure Balance and ROM are progressing  PT Plan: see one more week for 2 x a week.  Pt will be out of town next week so will come back week of 9/15.  Begin vector stance; manual to  increase dorsiflexion     Goals Home Exercise Program Pt/caregiver will Perform Home Exercise Program: For increased ROM PT Goal: Perform Home Exercise Program - Progress: Met PT Short Term Goals PT Short Term Goal 1: Patient will increase Lt ankle dorsiflexon to 20 degrees to be able walk withotu early heel rise PT Short Term Goal 1 - Progress: Progressing toward goal PT Short Term Goal 2: Patient will be able to Lt plantar flex foot to 50 degrees to be able to stand on toes to reach to a high shelf PT Short Term Goal 2 - Progress: Progressing toward goal PT Short Term Goal 3: Patient will be display increased Lt ankle inversion to 40 degrees and eversion to 20 degrees to indicate improved ankel mobility for gait.  PT Short Term Goal 3 - Progress: Met PT Short Term Goal 4: Patient will display ankle  dorsiflexion and plantar flexion to 4/5 MMT to indicate increased nkle strength and stability PT Short Term Goal 4 - Progress: Met PT Short Term Goal 5: Patient will be able to walk for 38minutes without pain.  PT Short Term Goal 5 - Progress: Progressing toward goal (pt feels he is limited secondary to heel pain )  Problem List Patient Active Problem List   Diagnosis Date Noted  . Pain in joint, ankle and foot 05/11/2014  . Stiffness of joint, not elsewhere classified, ankle and foot 05/11/2014  . Muscle weakness (generalized) 05/11/2014  . Peroneal tendonitis of left lower extremity 12/20/2013  . Incisional hernia 10/04/2012  . Abdominal pain 03/30/2012  . Perforated bowel 03/30/2012  . Leukocytosis 03/30/2012  . Hyperlipidemia 03/30/2012  . GERD (gastroesophageal reflux disease) 03/30/2012       GP    Jonika Critz,CINDY 06/22/2014, 1:11 PM  Physician Documentation Your signature is required to indicate approval of the treatment plan as stated above.  Please sign and either send electronically or make a copy of this report for your files and return this physician signed original.    Please mark one 1.__approve of plan  2. ___approve of plan with the following conditions.   ______________________________                                                          _____________________ Physician Signature  Date  

## 2014-06-27 ENCOUNTER — Ambulatory Visit (HOSPITAL_COMMUNITY): Payer: 59 | Admitting: Physical Therapy

## 2014-06-29 ENCOUNTER — Ambulatory Visit (HOSPITAL_COMMUNITY): Payer: 59 | Admitting: Physical Therapy

## 2014-07-04 ENCOUNTER — Ambulatory Visit (HOSPITAL_COMMUNITY)
Admission: RE | Admit: 2014-07-04 | Discharge: 2014-07-04 | Disposition: A | Discharge status: Home / Self Care | Payer: 59 | Source: Ambulatory Visit | Attending: Internal Medicine | Admitting: Internal Medicine

## 2014-07-04 DIAGNOSIS — IMO0001 Reserved for inherently not codable concepts without codable children: Secondary | ICD-10-CM | POA: Diagnosis not present

## 2014-07-04 NOTE — Progress Notes (Signed)
Physical Therapy Treatment Patient Details  Name: Jeffrey [name] MRN: [id number] Date of Birth: [birth year]  Today's Date: 07/04/2014 Time: 2831-5176 PT Time Calculation (min): 45 min Charge: TE 1607-3710, Manual [id number]  Visit#: 11 of 12  Re-eval: 07/08/14   Authorization: [employer]  Authorization Time Period:    Authorization Visit#:   of     Subjective: Symptoms/Limitations Symptoms: Pt stated ankle is feeling good today Pain Assessment Currently in Pain?: No/denies  Objective:   Exercise/Treatments Ankle Stretches Soleus Stretch: 2 reps;30 seconds Soleus Stretch Limitations: slant board Slant Board Stretch: 3 reps;30 seconds;Limitations Slant Board Stretch Limitations: 3 directions Other Stretch: 3D ankle excursion 10x Ankle Exercises - Standing BAPS: Level 3;Standing;10 reps BAPS Limitations: A/P, R/L,CW, CCW Vector Stance: Left;5 reps;10 seconds Toe Raise: 10 reps Other Standing Ankle Exercises: Single leg balance reach matrix common and uncommon 10x;  Manual Therapy Manual Therapy: Myofascial release Myofascial Release: MFR/STM to plantar surface lateral>medial and gastroc STM to improve dorsiflexon.    Physical Therapy Assessment and Plan PT Assessment and Plan Clinical Impression Statement: Session focus on improving ankle stabiltiy and AROM.  Added vectir stance for ankle strengthening and stability wtih therapist facilitation for form.  Manual techniques complete to improve elongation of gastroc and reduce fascial restrctions with noted improved AROM especially dorsiflexion.   PT Plan: Continue with current PT POC.    Goals PT Short Term Goals PT Short Term Goal 1: Patient will increase Lt ankle dorsiflexon to 20 degrees to be able walk withotu early heel rise PT Short Term Goal 2: Patient will be able to Lt plantar flex foot to 50 degrees to be able to stand on toes to reach to a high shelf PT Short Term Goal 3: Patient will be display increased Lt ankle  inversion to 40 degrees and eversion to 20 degrees to indicate improved ankel mobility for gait.  PT Short Term Goal 4: Patient will display ankle  dorsiflexion and plantar flexion to 4/5 MMT to indicate increased nkle strength and stability PT Short Term Goal 5: Patient will be able to walk for 69minutes without pain.   Problem List Patient Active Problem List   Diagnosis Date Noted  . Pain in joint, ankle and foot 05/11/2014  . Stiffness of joint, not elsewhere classified, ankle and foot 05/11/2014  . Muscle weakness (generalized) 05/11/2014  . Peroneal tendonitis of left lower extremity 12/20/2013  . Incisional hernia 10/04/2012  . Abdominal pain 03/30/2012  . Perforated bowel 03/30/2012  . Leukocytosis 03/30/2012  . Hyperlipidemia 03/30/2012  . GERD (gastroesophageal reflux disease) 03/30/2012    PT - End of Session Activity Tolerance: Patient tolerated treatment well General Behavior During Therapy: [facility] for tasks assessed/performed  GP    Aldona Lento 07/04/2014, 12:27 PM

## 2014-07-06 ENCOUNTER — Ambulatory Visit (HOSPITAL_COMMUNITY)
Admission: RE | Admit: 2014-07-06 | Discharge: 2014-07-06 | Disposition: A | Discharge status: Home / Self Care | Payer: 59 | Source: Ambulatory Visit | Attending: Internal Medicine | Admitting: Internal Medicine

## 2014-07-06 DIAGNOSIS — IMO0001 Reserved for inherently not codable concepts without codable children: Secondary | ICD-10-CM | POA: Diagnosis not present

## 2014-07-06 NOTE — Evaluation (Signed)
Physical Therapy Evaluation  Patient Details  Name: Jeffrey [name] MRN: [id number] Date of Birth: Oct 25, 1950  Today's Date: 07/06/2014 Time: 3614-4315 PT Time Calculation (min): 40 min    Charges: 1 MMT, 1 ROMM. 845-900 TE, Manual [id number]           Visit#: 12 of 12  Re-eval: 07/08/14 Assessment Diagnosis: Lt foot pain and stiffness followign Peroneus brevis reconstruction Surgical Date: 03/09/14 Next MD Visit: Doran Durand unscheduled Prior Therapy: Yeas, prior to surgery, patient is now WBAT  Authorization: [facility]     Past Medical History:  Past Medical History  Diagnosis Date  . Hypercholesteremia   . Cancer     skin cancer-chest and neck  . GERD (gastroesophageal reflux disease)   . Ventral hernia   . Abdominal distension   . Abdominal pain   . Wears glasses    Past Surgical History:  Past Surgical History  Procedure Laterality Date  . Knee arthroscopy      with bone removal-right leg- 2 yrs ago  . Hernia repair      umbilical hernia QMGQQP-6-1 yrs ago  . Cystectomy      right thumb 2o yrs  . Mass excision  12/11/2011    Procedure: EXCISION MASS;  Surgeon: Marcheta Grammes, DPM;  Location: [facility];  Service: Orthopedics;  Laterality: Left;  Excision of plantar fibroma  . Cystectomy    . Laparotomy  03/30/2012    Procedure: EXPLORATORY LAPAROTOMY;  Surgeon: Donato Heinz, MD;  Location: [facility];  Service: General;  Laterality: N/A;  . Bowel resection  03/30/2012    Procedure: SMALL BOWEL RESECTION;  Surgeon: Donato Heinz, MD;  Location: [facility];  Service: General;  Laterality: N/A;  . Incisional hernia repair  10/22/2012    Procedure: LAPAROSCOPIC INCISIONAL HERNIA;  Surgeon: Adin Hector, MD;  [facility];  Service: General;  Laterality: N/A;  incarcerated  . Insertion of mesh  10/22/2012    Procedure: INSERTION OF MESH;  Surgeon: Adin Hector, MD;  Location: [facility];  Service: General;  Laterality: N/A;  . Colonoscopy N/A 01/10/2014    Procedure: COLONOSCOPY;   Surgeon: Daneil Dolin, MD;  [facility];  Service: Endoscopy;  Laterality: N/A;  1:30 PM  . Repair of peroneus brevis tendon Left 03/09/2014    Procedure: LEFT PERONEUS BREVIS LONGUS TENOLYSIS AND TENDON REPAIR ;  Surgeon: Wylene Simmer, MD;  Location: [place];  Service: Orthopedics;  Laterality: Left;    Subjective Symptoms/Limitations Symptoms: Patient notes no recent pain aside from fatigue after last session. patient feels he has met all his goals Pain Assessment Currently in Pain?: No/denies  Precautions/Restrictions     Balance Screening    Prior Functioning     Cognition/Observation    Sensation/Coordination/Flexibility/Functional Tests Functional Tests Functional Tests: Gait: Functional Tests: Single leg balance reach matrix: Lt 67 (non-painful), Rt  67 (non-painful) cm  (measurements give in: sagittal, Frontal, and transverse plan)  Assessment LLE AROM (degrees) Left Ankle Dorsiflexion: 17 Left Ankle Plantar Flexion: 44 Left Ankle Inversion: 30 Left Ankle Eversion: 15 LLE Strength Left Ankle Dorsiflexion: 5/5 Left Ankle Plantar Flexion: 4/5 (was 2+/5 ) Left Ankle Inversion: 5/5 (5-/5 was 4/5) Left Ankle Eversion: 5/5 (was 3+/5)  Exercise/Treatments Mobility/Balance  Static Standing Balance Single Leg Stance - Right Leg: 60 Single Leg Stance - Left Leg: 60  Ankle Stretches Slant Board Stretch: 3 reps;30 seconds;Limitations Slant Board Stretch Limitations: 3 directions Other Stretch: 3D ankle excursion  on airex 10x Ankle Exercises - Standing Other Standing Ankle Exercises: SFT squat (27 positions) 2 each Other Standing Ankle Exercises: Single leg balance reach matrix common and uncommon from 4" box 10x;   Manual: talus glides to increase ankle dorsiflexion/planatar flexion, and scar tissue mobilization  Physical Therapy Assessment and Plan PT Assessment and Plan Clinical Impression Statement: Patient has met all goals.  Session focused on education of progression of exercises for HEP with manual performed to decrease scar adhesions.  PT Plan: Patient discharged with HEP    Goals PT Short Term Goals PT Short Term Goal 1: Patient will increase Lt ankle dorsiflexon to 20 degrees to be able walk withotu early heel rise PT Short Term Goal 1 - Progress: Partly met PT Short Term Goal 2: Patient will be able to Lt plantar flex foot to 50 degrees to be able to stand on toes to reach to a high shelf PT Short Term Goal 2 - Progress: Met PT Short Term Goal 3: Patient will be display increased Lt ankle inversion to 40 degrees and eversion to 20 degrees to indicate improved ankel mobility for gait.  PT Short Term Goal 3 - Progress: Met PT Short Term Goal 4: Patient will display ankle  dorsiflexion and plantar flexion to 4/5 MMT to indicate increased nkle strength and stability PT Short Term Goal 4 - Progress: Met PT Short Term Goal 5: Patient will be able to walk for 40minutes without pain.  PT Short Term Goal 5 - Progress: Met  Problem List Patient Active Problem List   Diagnosis Date Noted  . Pain in joint, ankle and foot 05/11/2014  . Stiffness of joint, not elsewhere classified, ankle and foot 05/11/2014  . Muscle weakness (generalized) 05/11/2014  . Peroneal tendonitis of left lower extremity 12/20/2013  . Incisional hernia 10/04/2012  . Abdominal pain 03/30/2012  . Perforated bowel 03/30/2012  . Leukocytosis 03/30/2012  . Hyperlipidemia 03/30/2012  . GERD (gastroesophageal reflux disease) 03/30/2012    PT - End of Session Activity Tolerance: Patient tolerated treatment well General Behavior During Therapy: WFL for tasks assessed/performed  GP Functional Assessment Tool Used: FOTZO: 34% limited Functional Limitation: Mobility: Walking and moving around Mobility: Walking and Moving Around Current Status ([id number]): At least 20 percent but less than 40 percent impaired, limited or restricted Mobility:  Walking and Moving Around Goal Status [phone]): At least 1 percent but less than 20 percent impaired, limited or restricted Mobility: Walking and Moving Around Discharge Status [phone]): At least 20 percent but less than 40 percent impaired, limited or restricted  Leia Alf 07/06/2014, 9:24 AM  Physician Documentation Your signature is required to indicate approval of the treatment plan as stated above.  Please sign and either send electronically or make a copy of this report for your files and return this physician signed original.   Please mark one 1.__approve of plan  2. ___approve of plan with the following conditions.   ______________________________                                                          _____________________ Physician Signature  Date  

## 2014-07-11 ENCOUNTER — Ambulatory Visit (HOSPITAL_COMMUNITY)
Admission: RE | Admit: 2014-07-11 | Payer: 59 | Source: Ambulatory Visit | Attending: Internal Medicine | Admitting: Internal Medicine

## 2014-08-09 DIAGNOSIS — C4491 Basal cell carcinoma of skin, unspecified: Secondary | ICD-10-CM

## 2014-08-09 HISTORY — DX: Basal cell carcinoma of skin, unspecified: C44.91

## 2015-02-12 ENCOUNTER — Other Ambulatory Visit: Payer: Self-pay | Admitting: Internal Medicine

## 2015-02-12 ENCOUNTER — Encounter: Payer: Self-pay | Admitting: Internal Medicine

## 2015-02-12 MED ORDER — OMEPRAZOLE-SODIUM BICARBONATE 40-1100 MG PO CAPS: 1.0000 | ORAL_CAPSULE | Freq: Every day | ORAL | Status: DC

## 2015-02-12 NOTE — Progress Notes (Signed)
Patient ID: Jeffrey [name], male   DOB: Aug 04, 1951, 64 y.o.   MRN: [id number] Patient seen socially this morning. States Zegerid doing very well for reflux. Let's call him in a prescription for Zegerid 40 mg capsules-take 1 daily. Dispensed 30 with 11 refills. I believe [facility] uses [facility] on [address]

## 2015-02-12 NOTE — Progress Notes (Signed)
rx has been sent to the pharmacy. 

## 2015-09-25 ENCOUNTER — Other Ambulatory Visit: Payer: Self-pay | Admitting: Physician Assistant

## 2015-09-25 DIAGNOSIS — C4491 Basal cell carcinoma of skin, unspecified: Secondary | ICD-10-CM

## 2015-09-25 HISTORY — DX: Basal cell carcinoma of skin, unspecified: C44.91

## 2015-09-26 ENCOUNTER — Other Ambulatory Visit: Payer: Self-pay | Admitting: Chiropractic Medicine

## 2015-09-26 ENCOUNTER — Ambulatory Visit
Admission: RE | Admit: 2015-09-26 | Discharge: 2015-09-26 | Disposition: A | Discharge status: Home / Self Care | Payer: Commercial Managed Care - HMO | Source: Ambulatory Visit | Attending: Chiropractic Medicine | Admitting: Chiropractic Medicine

## 2015-09-26 DIAGNOSIS — M5441 Lumbago with sciatica, right side: Secondary | ICD-10-CM

## 2015-09-26 DIAGNOSIS — M5442 Lumbago with sciatica, left side: Principal | ICD-10-CM

## 2015-10-08 ENCOUNTER — Telehealth: Payer: Self-pay | Admitting: Internal Medicine

## 2015-10-08 NOTE — Telephone Encounter (Signed)
LMOM to cal back with Dr.Jenkins office

## 2015-10-08 NOTE — Telephone Encounter (Signed)
Waiting on a referral form to be faxed over

## 2015-10-08 NOTE — Telephone Encounter (Signed)
The patient's request, I reviewed his spine MRI ordered by his chiropractor. I reviewed with Dr. Nevada Crane. 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 Given this information, at patient's request, will send him to a neurosurgeon. Please get him an appointment see Dr. Newman Pies as soon as feasible.

## 2015-10-09 NOTE — Telephone Encounter (Signed)
Referral has been made.

## 2015-11-06 ENCOUNTER — Other Ambulatory Visit: Payer: Self-pay | Admitting: Neurosurgery

## 2015-11-23 ENCOUNTER — Encounter (HOSPITAL_COMMUNITY): Payer: Self-pay

## 2015-11-23 ENCOUNTER — Encounter (HOSPITAL_COMMUNITY)
Admission: RE | Admit: 2015-11-23 | Discharge: 2015-11-23 | Disposition: A | Discharge status: Home / Self Care | Payer: Medicare Other | Source: Ambulatory Visit | Attending: Neurosurgery | Admitting: Neurosurgery

## 2015-11-23 DIAGNOSIS — Z0183 Encounter for blood typing: Secondary | ICD-10-CM | POA: Diagnosis not present

## 2015-11-23 DIAGNOSIS — M4316 Spondylolisthesis, lumbar region: Secondary | ICD-10-CM | POA: Diagnosis not present

## 2015-11-23 DIAGNOSIS — M4806 Spinal stenosis, lumbar region: Secondary | ICD-10-CM | POA: Diagnosis not present

## 2015-11-23 DIAGNOSIS — Z01812 Encounter for preprocedural laboratory examination: Secondary | ICD-10-CM | POA: Insufficient documentation

## 2015-11-23 DIAGNOSIS — Z6831 Body mass index (BMI) 31.0-31.9, adult: Secondary | ICD-10-CM | POA: Diagnosis not present

## 2015-11-23 LAB — TYPE AND SCREEN
ABO/RH(D): O POS
Antibody Screen: NEGATIVE

## 2015-11-23 LAB — CBC
HEMATOCRIT: 40.8 % (ref 39.0–52.0)
HEMOGLOBIN: 14 g/dL (ref 13.0–17.0)
MCH: 31.8 pg (ref 26.0–34.0)
MCHC: 34.3 g/dL (ref 30.0–36.0)
MCV: 92.7 fL (ref 78.0–100.0)
Platelets: 145 10*3/uL — ABNORMAL LOW (ref 150–400)
RBC: 4.4 MIL/uL (ref 4.22–5.81)
RDW: 12.9 % (ref 11.5–15.5)
WBC: 5.5 10*3/uL (ref 4.0–10.5)

## 2015-11-23 LAB — SURGICAL PCR SCREEN
MRSA, PCR: NEGATIVE
Staphylococcus aureus: NEGATIVE

## 2015-11-23 LAB — ABO/RH: ABO/RH(D): O POS

## 2015-11-23 NOTE — Progress Notes (Signed)
Spoke to Jeralyn Bennett at Dr. Arnoldo Morale' office stated pt. To arrive at 6:00 dos.

## 2015-11-23 NOTE — Pre-Procedure Instructions (Signed)
    AARIB CIMMINO  11/23/2015      [id number] - [place], [place] - [place] [id number] [place] [id number] Phone: [phone] Fax: [phone]    Your procedure is scheduled on Monday, Feb. 13  Report to [facility] Admitting at 6:00 A.M.  Call this number if you have problems the morning of surgery:  [phone]              Any questions prior to surgery call [phone] Monday-Friday 8am-4pm   Remember:  Do not eat food or drink liquids after midnight Sunday, feb.12  Take these medicines the morning of surgery with A SIP OF WATER : flonase if needed,pataday eye drop if needed,zegerid if needed              Stop aspirin, nsaids: advil, ibuprofen, aleve,motrin, BC'S,goody's 1 week prior to surgery (feb. 6)   Do not wear jewelry.  Do not wear lotions, powders, or perfumes.  You may  Not wear deodorant.  Do not shave 48 hours prior to surgery.  Men may shave face and neck.  Do not bring valuables to the hospital.  [facility] is not responsible for any belongings or valuables.  Contacts, dentures or bridgework may not be worn into surgery.  Leave your suitcase in the car.  After surgery it may be brought to your room.  For patients admitted to the hospital, discharge time will be determined by your treatment team.  Patients discharged the day of surgery will not be allowed to drive home.    Special instructions:  Review handouts  Please read over the following fact sheets that you were given. Pain Booklet, Coughing and Deep Breathing, Blood Transfusion Information, MRSA Information and Surgical Site Infection Prevention

## 2015-11-30 MED ORDER — CEFAZOLIN SODIUM-DEXTROSE 2-3 GM-% IV SOLR
2.0000 g | INTRAVENOUS | Status: AC
  Administered 2015-12-03 (×2): 2 g via INTRAVENOUS
  Filled 2015-11-30: qty 50

## 2015-12-02 NOTE — Anesthesia Preprocedure Evaluation (Addendum)
Anesthesia Evaluation  Patient identified by MRN, date of birth, ID band Patient awake    Reviewed: Allergy & Precautions, NPO status , Patient's Chart, lab work & pertinent test results  History of Anesthesia Complications Negative for: history of anesthetic complications  Airway Mallampati: III  TM Distance: >3 FB Neck ROM: Full    Dental no notable dental hx. (+) Dental Advisory Given, Caps   Pulmonary neg pulmonary ROS,    Pulmonary exam normal breath sounds clear to auscultation       Cardiovascular negative cardio ROS Normal cardiovascular exam Rhythm:Regular Rate:Normal     Neuro/Psych negative neurological ROS  negative psych ROS   GI/Hepatic Neg liver ROS, GERD  Medicated and Controlled,  Endo/Other  obesity  Renal/GU negative Renal ROS  negative genitourinary   Musculoskeletal negative musculoskeletal ROS (+)   Abdominal   Peds negative pediatric ROS (+)  Hematology negative hematology ROS (+)   Anesthesia Other Findings   Reproductive/Obstetrics negative OB ROS                            Anesthesia Physical Anesthesia Plan  ASA: II  Anesthesia Plan: General   Post-op Pain Management:    Induction: Intravenous  Airway Management Planned: Oral ETT  Additional Equipment: Arterial line  Intra-op Plan:   Post-operative Plan: Extubation in OR  Informed Consent: I have reviewed the patients History and Physical, chart, labs and discussed the procedure including the risks, benefits and alternatives for the proposed anesthesia with the patient or authorized representative who has indicated his/her understanding and acceptance.   Dental advisory given  Plan Discussed with: CRNA  Anesthesia Plan Comments:        Anesthesia Quick Evaluation

## 2015-12-03 ENCOUNTER — Inpatient Hospital Stay (HOSPITAL_COMMUNITY): Payer: Medicare Other

## 2015-12-03 ENCOUNTER — Encounter (HOSPITAL_COMMUNITY)
Admission: RE | Disposition: A | Discharge status: Home / Self Care | Payer: Self-pay | Source: Ambulatory Visit | Attending: Neurosurgery

## 2015-12-03 ENCOUNTER — Inpatient Hospital Stay (HOSPITAL_COMMUNITY): Payer: Medicare Other | Admitting: Anesthesiology

## 2015-12-03 ENCOUNTER — Inpatient Hospital Stay (HOSPITAL_COMMUNITY)
Admission: RE | Admit: 2015-12-03 | Discharge: 2015-12-06 | DRG: 460 | Disposition: A | Discharge status: Home / Self Care | Payer: Medicare Other | Source: Ambulatory Visit | Attending: Neurosurgery | Admitting: Neurosurgery

## 2015-12-03 DIAGNOSIS — M4806 Spinal stenosis, lumbar region: Secondary | ICD-10-CM | POA: Diagnosis present

## 2015-12-03 DIAGNOSIS — Z419 Encounter for procedure for purposes other than remedying health state, unspecified: Secondary | ICD-10-CM

## 2015-12-03 DIAGNOSIS — Z6830 Body mass index (BMI) 30.0-30.9, adult: Secondary | ICD-10-CM | POA: Diagnosis not present

## 2015-12-03 DIAGNOSIS — M4326 Fusion of spine, lumbar region: Secondary | ICD-10-CM | POA: Diagnosis not present

## 2015-12-03 DIAGNOSIS — E669 Obesity, unspecified: Secondary | ICD-10-CM | POA: Diagnosis present

## 2015-12-03 DIAGNOSIS — K219 Gastro-esophageal reflux disease without esophagitis: Secondary | ICD-10-CM | POA: Diagnosis present

## 2015-12-03 DIAGNOSIS — M4316 Spondylolisthesis, lumbar region: Secondary | ICD-10-CM | POA: Diagnosis not present

## 2015-12-03 DIAGNOSIS — Z79899 Other long term (current) drug therapy: Secondary | ICD-10-CM | POA: Diagnosis not present

## 2015-12-03 DIAGNOSIS — Z981 Arthrodesis status: Secondary | ICD-10-CM | POA: Diagnosis not present

## 2015-12-03 DIAGNOSIS — Z7982 Long term (current) use of aspirin: Secondary | ICD-10-CM

## 2015-12-03 DIAGNOSIS — E7800 Pure hypercholesterolemia, unspecified: Secondary | ICD-10-CM | POA: Diagnosis present

## 2015-12-03 DIAGNOSIS — Z85828 Personal history of other malignant neoplasm of skin: Secondary | ICD-10-CM | POA: Diagnosis not present

## 2015-12-03 HISTORY — PX: BACK SURGERY: SHX140

## 2015-12-03 SURGERY — POSTERIOR LUMBAR FUSION 3 LEVEL
Anesthesia: General | Site: Spine Lumbar

## 2015-12-03 MED ORDER — BUPIVACAINE LIPOSOME 1.3 % IJ SUSP
INTRAMUSCULAR | Status: DC | PRN
  Administered 2015-12-03: 20 mL

## 2015-12-03 MED ORDER — SODIUM CHLORIDE 0.9 % IJ SOLN
INTRAMUSCULAR | Status: AC
  Filled 2015-12-03: qty 10

## 2015-12-03 MED ORDER — PANTOPRAZOLE SODIUM 40 MG PO TBEC
40.0000 mg | DELAYED_RELEASE_TABLET | Freq: Every day | ORAL | Status: DC
  Administered 2015-12-06: 40 mg via ORAL
  Filled 2015-12-03 (×2): qty 1

## 2015-12-03 MED ORDER — ACETAMINOPHEN 650 MG RE SUPP: 650.0000 mg | RECTAL | Status: DC | PRN

## 2015-12-03 MED ORDER — ONDANSETRON HCL 4 MG/2ML IJ SOLN
INTRAMUSCULAR | Status: AC
  Filled 2015-12-03: qty 2

## 2015-12-03 MED ORDER — GLYCOPYRROLATE 0.2 MG/ML IJ SOLN
INTRAMUSCULAR | Status: DC | PRN
  Administered 2015-12-03: .4 mg via INTRAVENOUS

## 2015-12-03 MED ORDER — BUPIVACAINE LIPOSOME 1.3 % IJ SUSP
20.0000 mL | INTRAMUSCULAR | Status: AC
  Filled 2015-12-03: qty 20

## 2015-12-03 MED ORDER — ACETAMINOPHEN 10 MG/ML IV SOLN
INTRAVENOUS | Status: AC
  Administered 2015-12-03: 1000 mg via INTRAVENOUS
  Filled 2015-12-03: qty 100

## 2015-12-03 MED ORDER — ACETAMINOPHEN 325 MG PO TABS: 650.0000 mg | ORAL_TABLET | ORAL | Status: DC | PRN

## 2015-12-03 MED ORDER — ONDANSETRON HCL 4 MG/2ML IJ SOLN
4.0000 mg | Freq: Once | INTRAMUSCULAR | Status: AC | PRN
  Administered 2015-12-03: 4 mg via INTRAVENOUS

## 2015-12-03 MED ORDER — ARTIFICIAL TEARS OP OINT
TOPICAL_OINTMENT | OPHTHALMIC | Status: DC | PRN
  Administered 2015-12-03: 1 via OPHTHALMIC

## 2015-12-03 MED ORDER — NEOSTIGMINE METHYLSULFATE 10 MG/10ML IV SOLN
INTRAVENOUS | Status: AC
  Filled 2015-12-03: qty 1

## 2015-12-03 MED ORDER — LIDOCAINE HCL (CARDIAC) 20 MG/ML IV SOLN
INTRAVENOUS | Status: AC
  Filled 2015-12-03: qty 5

## 2015-12-03 MED ORDER — ALBUMIN HUMAN 5 % IV SOLN
INTRAVENOUS | Status: DC | PRN
  Administered 2015-12-03 (×2): via INTRAVENOUS

## 2015-12-03 MED ORDER — DIAZEPAM 5 MG PO TABS
5.0000 mg | ORAL_TABLET | Freq: Four times a day (QID) | ORAL | Status: DC | PRN
  Administered 2015-12-03 – 2015-12-06 (×9): 5 mg via ORAL
  Filled 2015-12-03 (×8): qty 1

## 2015-12-03 MED ORDER — DOCUSATE SODIUM 100 MG PO CAPS
100.0000 mg | ORAL_CAPSULE | Freq: Two times a day (BID) | ORAL | Status: DC
  Administered 2015-12-03 – 2015-12-06 (×6): 100 mg via ORAL
  Filled 2015-12-03 (×6): qty 1

## 2015-12-03 MED ORDER — LACTATED RINGERS IV SOLN
INTRAVENOUS | Status: DC | PRN
  Administered 2015-12-03 (×2): via INTRAVENOUS

## 2015-12-03 MED ORDER — PHENOL 1.4 % MT LIQD: 1.0000 | OROMUCOSAL | Status: DC | PRN

## 2015-12-03 MED ORDER — MIDAZOLAM HCL 5 MG/5ML IJ SOLN
INTRAMUSCULAR | Status: DC | PRN
  Administered 2015-12-03: 2 mg via INTRAVENOUS

## 2015-12-03 MED ORDER — LIDOCAINE HCL (CARDIAC) 20 MG/ML IV SOLN
INTRAVENOUS | Status: DC | PRN
  Administered 2015-12-03: 80 mg via INTRAVENOUS

## 2015-12-03 MED ORDER — MENTHOL 3 MG MT LOZG: 1.0000 | LOZENGE | OROMUCOSAL | Status: DC | PRN

## 2015-12-03 MED ORDER — ROCURONIUM BROMIDE 50 MG/5ML IV SOLN
INTRAVENOUS | Status: AC
  Filled 2015-12-03: qty 1

## 2015-12-03 MED ORDER — EPHEDRINE SULFATE 50 MG/ML IJ SOLN
INTRAMUSCULAR | Status: AC
  Filled 2015-12-03: qty 1

## 2015-12-03 MED ORDER — VANCOMYCIN HCL 1000 MG IV SOLR
INTRAVENOUS | Status: DC | PRN
  Administered 2015-12-03: 1000 mg via TOPICAL

## 2015-12-03 MED ORDER — PROPOFOL 10 MG/ML IV BOLUS
INTRAVENOUS | Status: DC | PRN
  Administered 2015-12-03: 200 mg via INTRAVENOUS

## 2015-12-03 MED ORDER — BACITRACIN ZINC 500 UNIT/GM EX OINT
TOPICAL_OINTMENT | CUTANEOUS | Status: DC | PRN
  Administered 2015-12-03: 1 via TOPICAL

## 2015-12-03 MED ORDER — LACTATED RINGERS IV SOLN
INTRAVENOUS | Status: DC
  Administered 2015-12-03: 07:00:00 via INTRAVENOUS

## 2015-12-03 MED ORDER — DIAZEPAM 5 MG PO TABS
ORAL_TABLET | ORAL | Status: AC
  Administered 2015-12-03: 20:00:00
  Filled 2015-12-03: qty 1

## 2015-12-03 MED ORDER — ONDANSETRON HCL 4 MG/2ML IJ SOLN
4.0000 mg | INTRAMUSCULAR | Status: DC | PRN
  Administered 2015-12-03: 4 mg via INTRAVENOUS
  Filled 2015-12-03: qty 2

## 2015-12-03 MED ORDER — PHENYLEPHRINE HCL 10 MG/ML IJ SOLN
10.0000 mg | INTRAVENOUS | Status: DC | PRN
  Administered 2015-12-03: 20 ug/min via INTRAVENOUS

## 2015-12-03 MED ORDER — PROPOFOL 10 MG/ML IV BOLUS
INTRAVENOUS | Status: AC
  Filled 2015-12-03: qty 20

## 2015-12-03 MED ORDER — SUFENTANIL CITRATE 50 MCG/ML IV SOLN
INTRAVENOUS | Status: AC
  Filled 2015-12-03: qty 1

## 2015-12-03 MED ORDER — MIDAZOLAM HCL 2 MG/2ML IJ SOLN
INTRAMUSCULAR | Status: AC
  Filled 2015-12-03: qty 2

## 2015-12-03 MED ORDER — HYDROCODONE-ACETAMINOPHEN 5-325 MG PO TABS: 1.0000 | ORAL_TABLET | ORAL | Status: DC | PRN

## 2015-12-03 MED ORDER — NEOSTIGMINE METHYLSULFATE 10 MG/10ML IV SOLN
INTRAVENOUS | Status: DC | PRN
  Administered 2015-12-03: 3 mg via INTRAVENOUS

## 2015-12-03 MED ORDER — EPHEDRINE SULFATE 50 MG/ML IJ SOLN
INTRAMUSCULAR | Status: DC | PRN
  Administered 2015-12-03 (×3): 5 mg via INTRAVENOUS

## 2015-12-03 MED ORDER — THROMBIN 20000 UNITS EX SOLR
CUTANEOUS | Status: DC | PRN
  Administered 2015-12-03: 20 mL via TOPICAL

## 2015-12-03 MED ORDER — SUFENTANIL CITRATE 50 MCG/ML IV SOLN
INTRAVENOUS | Status: DC | PRN
  Administered 2015-12-03 (×15): 5 ug via INTRAVENOUS

## 2015-12-03 MED ORDER — OXYCODONE-ACETAMINOPHEN 5-325 MG PO TABS
1.0000 | ORAL_TABLET | ORAL | Status: DC | PRN
  Administered 2015-12-03 – 2015-12-06 (×14): 2 via ORAL
  Filled 2015-12-03 (×14): qty 2

## 2015-12-03 MED ORDER — VANCOMYCIN HCL 1000 MG IV SOLR
INTRAVENOUS | Status: AC
  Filled 2015-12-03: qty 1000

## 2015-12-03 MED ORDER — DEXAMETHASONE SODIUM PHOSPHATE 10 MG/ML IJ SOLN
INTRAMUSCULAR | Status: DC | PRN
  Administered 2015-12-03: 4 mg via INTRAVENOUS

## 2015-12-03 MED ORDER — BISACODYL 10 MG RE SUPP: 10.0000 mg | Freq: Every day | RECTAL | Status: DC | PRN

## 2015-12-03 MED ORDER — FENTANYL CITRATE (PF) 100 MCG/2ML IJ SOLN
25.0000 ug | INTRAMUSCULAR | Status: DC | PRN
  Administered 2015-12-03 (×4): 25 ug via INTRAVENOUS

## 2015-12-03 MED ORDER — CEFAZOLIN SODIUM-DEXTROSE 2-3 GM-% IV SOLR
2.0000 g | Freq: Three times a day (TID) | INTRAVENOUS | Status: AC
  Administered 2015-12-03 – 2015-12-04 (×2): 2 g via INTRAVENOUS
  Filled 2015-12-03 (×2): qty 50

## 2015-12-03 MED ORDER — ROSUVASTATIN CALCIUM 20 MG PO TABS
10.0000 mg | ORAL_TABLET | Freq: Every day | ORAL | Status: DC
  Administered 2015-12-03 – 2015-12-05 (×3): 10 mg via ORAL
  Filled 2015-12-03 (×3): qty 1

## 2015-12-03 MED ORDER — OLOPATADINE HCL 0.1 % OP SOLN
1.0000 [drp] | Freq: Two times a day (BID) | OPHTHALMIC | Status: DC
  Administered 2015-12-04 – 2015-12-06 (×3): 1 [drp] via OPHTHALMIC
  Filled 2015-12-03: qty 5

## 2015-12-03 MED ORDER — LACTATED RINGERS IV SOLN
INTRAVENOUS | Status: DC
  Administered 2015-12-03: 19:00:00 via INTRAVENOUS

## 2015-12-03 MED ORDER — FENTANYL CITRATE (PF) 100 MCG/2ML IJ SOLN
INTRAMUSCULAR | Status: AC
  Filled 2015-12-03: qty 2

## 2015-12-03 MED ORDER — SODIUM CHLORIDE 0.9 % IR SOLN
Status: DC | PRN
  Administered 2015-12-03: 500 mL

## 2015-12-03 MED ORDER — GLYCOPYRROLATE 0.2 MG/ML IJ SOLN
INTRAMUSCULAR | Status: AC
  Filled 2015-12-03: qty 2

## 2015-12-03 MED ORDER — MORPHINE SULFATE (PF) 2 MG/ML IV SOLN
1.0000 mg | INTRAVENOUS | Status: DC | PRN
  Administered 2015-12-03 – 2015-12-05 (×6): 4 mg via INTRAVENOUS
  Filled 2015-12-03 (×6): qty 2

## 2015-12-03 MED ORDER — ALUM & MAG HYDROXIDE-SIMETH 200-200-20 MG/5ML PO SUSP: 30.0000 mL | Freq: Four times a day (QID) | ORAL | Status: DC | PRN

## 2015-12-03 MED ORDER — ONDANSETRON HCL 4 MG/2ML IJ SOLN
INTRAMUSCULAR | Status: DC | PRN
  Administered 2015-12-03: 4 mg via INTRAVENOUS

## 2015-12-03 MED ORDER — ROCURONIUM BROMIDE 100 MG/10ML IV SOLN
INTRAVENOUS | Status: DC | PRN
  Administered 2015-12-03: 20 mg via INTRAVENOUS
  Administered 2015-12-03: 50 mg via INTRAVENOUS
  Administered 2015-12-03 (×3): 10 mg via INTRAVENOUS

## 2015-12-03 MED ORDER — ROCURONIUM BROMIDE 50 MG/5ML IV SOLN
INTRAVENOUS | Status: AC
  Filled 2015-12-03: qty 2

## 2015-12-03 MED ORDER — 0.9 % SODIUM CHLORIDE (POUR BTL) OPTIME
TOPICAL | Status: DC | PRN
  Administered 2015-12-03: 1000 mL

## 2015-12-03 MED ORDER — BUPIVACAINE-EPINEPHRINE (PF) 0.5% -1:200000 IJ SOLN
INTRAMUSCULAR | Status: DC | PRN
  Administered 2015-12-03: 10 mL

## 2015-12-03 MED ORDER — SUCCINYLCHOLINE CHLORIDE 20 MG/ML IJ SOLN
INTRAMUSCULAR | Status: AC
  Filled 2015-12-03: qty 1

## 2015-12-03 MED ORDER — FLUTICASONE PROPIONATE 50 MCG/ACT NA SUSP: 1.0000 | Freq: Every day | NASAL | Status: DC | PRN

## 2015-12-03 SURGICAL SUPPLY — 71 items
BAG DECANTER FOR FLEXI CONT (MISCELLANEOUS) ×3 IMPLANT
BENZOIN TINCTURE PRP APPL 2/3 (GAUZE/BANDAGES/DRESSINGS) ×3 IMPLANT
BLADE CLIPPER SURG (BLADE) ×3 IMPLANT
BRUSH SCRUB EZ PLAIN DRY (MISCELLANEOUS) ×3 IMPLANT
BUR MATCHSTICK NEURO 3.0 LAGG (BURR) ×3 IMPLANT
BUR PRECISION FLUTE 6.0 (BURR) ×3 IMPLANT
CANISTER SUCT 3000ML PPV (MISCELLANEOUS) ×3 IMPLANT
CAP REVERE LOCKING (Cap) ×24 IMPLANT
CLOSURE WOUND 1/2 X4 (GAUZE/BANDAGES/DRESSINGS) ×2
CONN CROSSLINK REV 6.35 48-60 (Connector) ×3 IMPLANT
CONNECTOR CRSLNK REV6.35 48-60 (Connector) ×1 IMPLANT
CONT SPEC 4OZ CLIKSEAL STRL BL (MISCELLANEOUS) ×3 IMPLANT
COVER BACK TABLE 60X90IN (DRAPES) ×3 IMPLANT
DRAPE C-ARM 42X72 X-RAY (DRAPES) ×6 IMPLANT
DRAPE LAPAROTOMY 100X72X124 (DRAPES) ×3 IMPLANT
DRAPE POUCH INSTRU U-SHP 10X18 (DRAPES) ×3 IMPLANT
DRAPE PROXIMA HALF (DRAPES) ×3 IMPLANT
DRAPE SURG 17X23 STRL (DRAPES) ×12 IMPLANT
ELECT BLADE 4.0 EZ CLEAN MEGAD (MISCELLANEOUS) ×3
ELECT REM PT RETURN 9FT ADLT (ELECTROSURGICAL) ×3
ELECTRODE BLDE 4.0 EZ CLN MEGD (MISCELLANEOUS) ×1 IMPLANT
ELECTRODE REM PT RTRN 9FT ADLT (ELECTROSURGICAL) ×1 IMPLANT
EVACUATOR 1/8 PVC DRAIN (DRAIN) ×3 IMPLANT
GAUZE SPONGE 4X4 12PLY STRL (GAUZE/BANDAGES/DRESSINGS) ×3 IMPLANT
GAUZE SPONGE 4X4 16PLY XRAY LF (GAUZE/BANDAGES/DRESSINGS) ×6 IMPLANT
GLOVE BIO SURGEON STRL SZ 6.5 (GLOVE) ×2 IMPLANT
GLOVE BIO SURGEON STRL SZ8 (GLOVE) ×6 IMPLANT
GLOVE BIO SURGEON STRL SZ8.5 (GLOVE) ×6 IMPLANT
GLOVE BIO SURGEONS STRL SZ 6.5 (GLOVE) ×1
GLOVE BIOGEL PI IND STRL 6.5 (GLOVE) ×1 IMPLANT
GLOVE BIOGEL PI IND STRL 7.0 (GLOVE) ×6 IMPLANT
GLOVE BIOGEL PI IND STRL 7.5 (GLOVE) ×3 IMPLANT
GLOVE BIOGEL PI INDICATOR 6.5 (GLOVE) ×2
GLOVE BIOGEL PI INDICATOR 7.0 (GLOVE) ×12
GLOVE BIOGEL PI INDICATOR 7.5 (GLOVE) ×6
GLOVE ECLIPSE 6.5 STRL STRAW (GLOVE) ×3 IMPLANT
GLOVE EXAM NITRILE LRG STRL (GLOVE) IMPLANT
GLOVE EXAM NITRILE MD LF STRL (GLOVE) IMPLANT
GLOVE EXAM NITRILE XL STR (GLOVE) IMPLANT
GLOVE EXAM NITRILE XS STR PU (GLOVE) IMPLANT
GOWN STRL REUS W/ TWL LRG LVL3 (GOWN DISPOSABLE) ×4 IMPLANT
GOWN STRL REUS W/ TWL XL LVL3 (GOWN DISPOSABLE) ×2 IMPLANT
GOWN STRL REUS W/TWL 2XL LVL3 (GOWN DISPOSABLE) IMPLANT
GOWN STRL REUS W/TWL LRG LVL3 (GOWN DISPOSABLE) ×8
GOWN STRL REUS W/TWL XL LVL3 (GOWN DISPOSABLE) ×4
KIT BASIN OR (CUSTOM PROCEDURE TRAY) ×3 IMPLANT
KIT ROOM TURNOVER OR (KITS) ×3 IMPLANT
NEEDLE HYPO 21X1.5 SAFETY (NEEDLE) ×3 IMPLANT
NEEDLE HYPO 22GX1.5 SAFETY (NEEDLE) ×3 IMPLANT
NS IRRIG 1000ML POUR BTL (IV SOLUTION) ×3 IMPLANT
PACK LAMINECTOMY NEURO (CUSTOM PROCEDURE TRAY) ×3 IMPLANT
PAD ARMBOARD 7.5X6 YLW CONV (MISCELLANEOUS) ×15 IMPLANT
PATTIES SURGICAL .5 X1 (DISPOSABLE) ×3 IMPLANT
ROD CURVED REVERE 6.35 95MM (Rod) ×6 IMPLANT
SCREW REVERE 6.35 75X55MM (Screw) ×24 IMPLANT
SEALER EPIDURAL VEIN MINI EVS (INSTRUMENTS) ×3 IMPLANT
SPACER ALTERA 10X31 8-12MM-8 (Spacer) ×9 IMPLANT
SPONGE LAP 4X18 X RAY DECT (DISPOSABLE) IMPLANT
SPONGE NEURO XRAY DETECT 1X3 (DISPOSABLE) IMPLANT
SPONGE SURGIFOAM ABS GEL 100 (HEMOSTASIS) ×3 IMPLANT
STRIP BIOACTIVE 20CC 25X100X8 (Miscellaneous) ×6 IMPLANT
STRIP CLOSURE SKIN 1/2X4 (GAUZE/BANDAGES/DRESSINGS) ×4 IMPLANT
SUT VIC AB 1 CT1 18XBRD ANBCTR (SUTURE) ×3 IMPLANT
SUT VIC AB 1 CT1 8-18 (SUTURE) ×6
SUT VIC AB 2-0 CP2 18 (SUTURE) ×9 IMPLANT
TAPE CLOTH SURG 4X10 WHT LF (GAUZE/BANDAGES/DRESSINGS) ×3 IMPLANT
TOWEL OR 17X24 6PK STRL BLUE (TOWEL DISPOSABLE) ×3 IMPLANT
TOWEL OR 17X26 10 PK STRL BLUE (TOWEL DISPOSABLE) ×3 IMPLANT
TRAY FOLEY W/METER SILVER 14FR (SET/KITS/TRAYS/PACK) IMPLANT
TRAY FOLEY W/METER SILVER 16FR (SET/KITS/TRAYS/PACK) ×3 IMPLANT
WATER STERILE IRR 1000ML POUR (IV SOLUTION) ×3 IMPLANT

## 2015-12-03 NOTE — H&P (Signed)
Subjective: The patient is a 65 year old white male who has complained of back, buttock, and leg pain consistent with neurogenic claudication. He has failed medical management and was worked up with a lumbar MRI and lumbar x-rays. This demonstrated spinal stenosis, spondylolisthesis, etc. at L2-3, L3-4 and L4-5. I discussed the various treatment options with the patient including surgery. He has weighed the risks, benefits, and alternative surgery and decided proceed with a lumbar decompression, instrumentation, and fusion.   Past Medical History  Diagnosis Date  . Hypercholesteremia   . Cancer ([place])     skin cancer-chest and neck  . GERD (gastroesophageal reflux disease)   . Ventral hernia   . Abdominal distension   . Abdominal pain   . Wears glasses     Past Surgical History  Procedure Laterality Date  . Knee arthroscopy      with bone removal-right leg- 2 yrs ago  . Hernia repair      umbilical hernia [id number] yrs ago  . Cystectomy      right thumb 2o yrs  . Mass excision  12/11/2011    Procedure: EXCISION MASS;  Surgeon: Marcheta Grammes, DPM;  Location: [facility];  Service: Orthopedics;  Laterality: Left;  Excision of plantar fibroma  . Cystectomy    . Laparotomy  03/30/2012    Procedure: EXPLORATORY LAPAROTOMY;  Surgeon: Donato Heinz, MD;  Location: [facility];  Service: General;  Laterality: N/A;  . Bowel resection  03/30/2012    Procedure: SMALL BOWEL RESECTION;  Surgeon: Donato Heinz, MD;  Location: [facility];  Service: General;  Laterality: N/A;  . Incisional hernia repair  10/22/2012    Procedure: LAPAROSCOPIC INCISIONAL HERNIA;  Surgeon: Adin Hector, MD;  Location: [facility];  Service: General;  Laterality: N/A;  incarcerated  . Insertion of mesh  10/22/2012    Procedure: INSERTION OF MESH;  Surgeon: Adin Hector, MD;  Location: [facility];  Service: General;  Laterality: N/A;  . Colonoscopy N/A 01/10/2014    Procedure: COLONOSCOPY;  Surgeon: Daneil Dolin, MD;   Location: [facility];  Service: Endoscopy;  Laterality: N/A;  1:30 PM  . Repair of peroneus brevis tendon Left 03/09/2014    Procedure: LEFT PERONEUS BREVIS LONGUS TENOLYSIS AND TENDON REPAIR ;  Surgeon: Wylene Simmer, MD;  Location: [place];  Service: Orthopedics;  Laterality: Left;    No Known Allergies  Social History  Substance Use Topics  . Smoking status: Never Smoker   . Smokeless tobacco: Never Used  . Alcohol Use: No     Comment: a beer on occasion    Family History  Problem Relation Age of Onset  . Anesthesia problems Neg Hx   . Hypotension Neg Hx   . Malignant hyperthermia Neg Hx   . Pseudochol deficiency Neg Hx   . Cancer Mother     breast  . Stroke Mother    Prior to Admission medications   Medication Sig Start Date End Date Taking? Authorizing Provider  aspirin EC 81 MG tablet Take 81 mg by mouth at bedtime.    Yes Historical Provider, MD  fluticasone (FLONASE) 50 MCG/ACT nasal spray Place 1 spray into the nose daily as needed. Allergies   Yes Historical Provider, MD  Olopatadine HCl (PATADAY) 0.2 % SOLN Apply 1 drop to eye as needed.   Yes Historical Provider, MD  omeprazole-sodium bicarbonate (ZEGERID) 40-1100 MG per capsule Take 1 capsule by mouth daily as needed (for acid reflux).  08/24/13  Yes Daneil Dolin, MD  rosuvastatin (CRESTOR) 10 MG tablet Take 10 mg by mouth at bedtime.    Yes Historical Provider, MD  senna (SENOKOT) 8.6 MG tablet Take 1 tablet by mouth every other day.   Yes Historical Provider, MD     Review of Systems  Positive ROS: As above  All other systems have been reviewed and were otherwise negative with the exception of those mentioned in the HPI and as above.  Objective: Vital signs in last 24 hours: Temp:  [97.9 F (36.6 C)] 97.9 F (36.6 C) (02/13 [id number]) Pulse Rate:  [72] 72 (02/13 0633) Resp:  [20] 20 (02/13 0633) BP: (142)/(83) 142/83 mmHg (02/13 0633) SpO2:  [98 %] 98 % (02/13 [id number]) Weight:  [92.08 kg (203  lb)] 92.08 kg (203 lb) (02/13 [id number])  General Appearance: Alert, cooperative, no distress, Head: Normocephalic, without obvious abnormality, atraumatic Eyes: PERRL, conjunctiva/corneas clear, EOM's intact,    Ears: Normal  Throat: Normal  Neck: Supple, symmetrical, trachea midline, no adenopathy; thyroid: No enlargement/tenderness/nodules; no carotid bruit or JVD Back: Symmetric, no curvature, ROM normal, no CVA tenderness Lungs: Clear to auscultation bilaterally, respirations unlabored Heart: Regular rate and rhythm, no murmur, rub or gallop Abdomen: Soft, non-tender,, no masses, no organomegaly Extremities: Extremities normal, atraumatic, no cyanosis or edema Pulses: 2+ and symmetric all extremities Skin: Skin color, texture, turgor normal, no rashes or lesions  NEUROLOGIC:   Mental status: alert and oriented, no aphasia, good attention span, Fund of knowledge/ memory ok Motor Exam - grossly normal Sensory Exam - grossly normal Reflexes:  Coordination - grossly normal Gait - grossly normal Balance - grossly normal Cranial Nerves: I: smell Not tested  II: visual acuity  OS: Normal  OD: Normal   II: visual fields Full to confrontation  II: pupils Equal, round, reactive to light  III,VII: ptosis None  III,IV,VI: extraocular muscles  Full ROM  V: mastication Normal  V: facial light touch sensation  Normal  V,VII: corneal reflex  Present  VII: facial muscle function - upper  Normal  VII: facial muscle function - lower Normal  VIII: hearing Not tested  IX: soft palate elevation  Normal  IX,X: gag reflex Present  XI: trapezius strength  5/5  XI: sternocleidomastoid strength 5/5  XI: neck flexion strength  5/5  XII: tongue strength  Normal    Data Review Lab Results  Component Value Date   WBC 5.5 11/23/2015   HGB 14.0 11/23/2015   HCT 40.8 11/23/2015   MCV 92.7 11/23/2015   PLT 145* 11/23/2015   Lab Results  Component Value Date   NA 140 10/25/2012   K 4.6  10/25/2012   CL 102 10/25/2012   CO2 28 10/25/2012   BUN 8 10/25/2012   CREATININE 0.84 10/25/2012   GLUCOSE 94 10/25/2012   No results found for: INR, PROTIME  Assessment/Plan: L2-3, L3-4 and L4-5 spondylolisthesis, spinal stenosis, lumbago, lumbar radiculopathy, neurogenic claudication: I have discussed the situation with the patient and his wife. I have reviewed his imaging studies with him and pointed out the abnormalities. We have discussed the various treatment options including surgery. I have described the surgical treatment option of an L2-3, L3-4 and L4-5 decompression, instrumentation, and fusion. I have shown him surgical models. We have discussed the risks, benefits, alternatives, and likelihood of achieving goals with surgery. I have answered all the patient's questions. He has decided to proceed with surgery.   Ervin Rothbauer D 12/03/2015 11:57 AM

## 2015-12-03 NOTE — Anesthesia Postprocedure Evaluation (Signed)
Anesthesia Post Note  Patient: Jeffrey [name]  Procedure(s) Performed: Procedure(s) (LRB): LUMBAR TWO-THREE,LUMBAR THREE-FOUR,LUMBAR FOUR-FIVE POSTERIOR LUMBAR INTERBODY FUSION WITH INTERBODY PROSTHESIS,POSTERIOR LATERAL ARTHRODESIS AND POSTERIOR SEGMENTAL INSTRUMENTATION. (N/A)  Patient location during evaluation: PACU Anesthesia Type: General Level of consciousness: awake and alert, patient cooperative and oriented Pain management: pain level controlled Vital Signs Assessment: post-procedure vital signs reviewed and stable Respiratory status: spontaneous breathing, nonlabored ventilation, respiratory function stable and patient connected to nasal cannula oxygen Cardiovascular status: blood pressure returned to baseline and stable Postop Assessment: no signs of nausea or vomiting Anesthetic complications: no    Last Vitals:  Filed Vitals:   12/03/15 1930 12/03/15 1935  BP: 134/81 126/76  Pulse: 92 96  Temp:  36.6 C  Resp: 10 8    Last Pain:  Filed Vitals:   12/03/15 1936  PainSc: 6                  Janeya Deyo,E. Yerick Eggebrecht

## 2015-12-03 NOTE — Progress Notes (Signed)
Subjective:  The patient is alert and pleasant. He is in no apparent distress.  Objective: Vital signs in last 24 hours: Temp:  [97.9 F (36.6 C)-98 F (36.7 C)] 98 F (36.7 C) (02/13 1759) Pulse Rate:  [72-113] 113 (02/13 1759) Resp:  [10-20] 10 (02/13 1759) BP: (126-142)/(63-83) 126/63 mmHg (02/13 1759) SpO2:  [97 %-98 %] 97 % (02/13 1759) Arterial Line BP: (132)/(71) 132/71 mmHg (02/13 1759) Weight:  [92.08 kg (203 lb)] 92.08 kg (203 lb) (02/13 [id number])  Intake/Output from previous day:   Intake/Output this shift: Total I/O In: 2900 [I.V.:2400; IV Piggyback:500] Out: 1650 [Urine:1250; Blood:400]  Physical exam the patient is alert and pleasant. He is moving his lower extremities well.  Lab Results: No results for input(s): WBC, HGB, HCT, PLT in the last 72 hours. BMET No results for input(s): NA, K, CL, CO2, GLUCOSE, BUN, CREATININE, CALCIUM in the last 72 hours.  Studies/Results: Dg Lumbar Spine 2-3 Views  12/03/2015  CLINICAL DATA:  L2-5 PLIF EXAM: LUMBAR SPINE - 2-3 VIEW; DG C-ARM 61-120 MIN FLUOROSCOPY TIME:  48 seconds COMPARISON:  Intraoperative lumbar spine radiograph dated 12/02/2005 FINDINGS: Intraoperative frontal and lateral fluoroscopic images during L2-5 PLIF. IMPRESSION: Intraoperative fluoroscopic images, as above. Electronically Signed   By: Julian Hy M.D.   On: 12/03/2015 17:12   Dg Lumbar Spine 1 View  12/03/2015  CLINICAL DATA:  Missing drill bit. EXAM: LUMBAR SPINE - 1 VIEW COMPARISON:  None. FINDINGS: Vertebral body heights are maintained. Posterior lumbar interbody fusion from L2 through L5 with bilateral pedicle screws at each level. No hardware failure complication. No metallic foreign body to suggest a drill bit. IMPRESSION: No metallic foreign body to suggest a drill bit. Electronically Signed   By: Kathreen Devoid   On: 12/03/2015 18:07   Dg Lumbar Spine 1 View  12/03/2015  CLINICAL DATA:  Surgical posterior fusion of L2 through L5. EXAM: LUMBAR  SPINE - 1 VIEW COMPARISON:  MRI of September 26, 2015. FINDINGS: Single intraoperative cross-table lateral projection of the lumbar spine demonstrates surgical probe directed toward the posterior elements of L3. IMPRESSION: Surgical localization as described above. Electronically Signed   By: Marijo Conception, M.D.   On: 12/03/2015 14:28   Dg C-arm 61-120 Min  12/03/2015  CLINICAL DATA:  L2-5 PLIF EXAM: LUMBAR SPINE - 2-3 VIEW; DG C-ARM 61-120 MIN FLUOROSCOPY TIME:  48 seconds COMPARISON:  Intraoperative lumbar spine radiograph dated 12/02/2005 FINDINGS: Intraoperative frontal and lateral fluoroscopic images during L2-5 PLIF. IMPRESSION: Intraoperative fluoroscopic images, as above. Electronically Signed   By: Julian Hy M.D.   On: 12/03/2015 17:12    Assessment/Plan: The patient is doing well. I spoke with his family.  LOS: 0 days     [place] D 12/03/2015, 6:14 PM

## 2015-12-03 NOTE — Transfer of Care (Signed)
Immediate Anesthesia Transfer of Care Note  Patient: Jeffrey [name]  Procedure(s) Performed: Procedure(s): LUMBAR TWO-THREE,LUMBAR THREE-FOUR,LUMBAR FOUR-FIVE POSTERIOR LUMBAR INTERBODY FUSION WITH INTERBODY PROSTHESIS,POSTERIOR LATERAL ARTHRODESIS AND POSTERIOR SEGMENTAL INSTRUMENTATION. (N/A)  Patient Location: PACU  Anesthesia Type:General  Level of Consciousness: awake, alert , oriented and patient cooperative  Airway & Oxygen Therapy: Patient Spontanous Breathing and Patient connected to nasal cannula oxygen  Post-op Assessment: Report given to RN, Post -op Vital signs reviewed and stable and Patient moving all extremities  Post vital signs: Reviewed and stable  Last Vitals:  Filed Vitals:   12/03/15 0633  BP: 142/83  Pulse: 72  Temp: 36.6 C  Resp: 20    Complications: No apparent anesthesia complications

## 2015-12-03 NOTE — Anesthesia Postprocedure Evaluation (Signed)
Anesthesia Post Note  Patient: Jeffrey [name]  Procedure(s) Performed: Procedure(s) (LRB): LUMBAR TWO-THREE,LUMBAR THREE-FOUR,LUMBAR FOUR-FIVE POSTERIOR LUMBAR INTERBODY FUSION WITH INTERBODY PROSTHESIS,POSTERIOR LATERAL ARTHRODESIS AND POSTERIOR SEGMENTAL INSTRUMENTATION. (N/A)  Patient location during evaluation: PACU Anesthesia Type: General Level of consciousness: awake and alert Pain management: pain level controlled Vital Signs Assessment: post-procedure vital signs reviewed and stable Respiratory status: spontaneous breathing, nonlabored ventilation, respiratory function stable and patient connected to nasal cannula oxygen Cardiovascular status: blood pressure returned to baseline and stable Postop Assessment: no signs of nausea or vomiting Anesthetic complications: no    Last Vitals:  Filed Vitals:   12/03/15 1935 12/03/15 1950  BP: 126/76 136/70  Pulse: 96 76  Temp: 36.6 C 36.7 C  Resp: 8 20    Last Pain:  Filed Vitals:   12/03/15 2018  PainSc: 6                  Carrington Olazabal J

## 2015-12-03 NOTE — Anesthesia Procedure Notes (Signed)
Procedure Name: Intubation Date/Time: 12/03/2015 12:08 PM Performed by: Izora Gala Pre-anesthesia Checklist: Patient identified, Emergency Drugs available, Suction available and Patient being monitored Patient Re-evaluated:Patient Re-evaluated prior to inductionOxygen Delivery Method: Circle system utilized Preoxygenation: Pre-oxygenation with 100% oxygen Intubation Type: IV induction Ventilation: Mask ventilation without difficulty Laryngoscope Size: Glidescope and 2 (Elective Glidescope based on previous anesthetic.  Class III view.) Grade View: Grade I Tube type: Oral Tube size: 7.5 mm Number of attempts: 1 Airway Equipment and Method: Stylet Placement Confirmation: ETT inserted through vocal cords under direct vision,  positive ETCO2 and breath sounds checked- equal and bilateral Secured at: 23 cm Tube secured with: Tape Dental Injury: Teeth and Oropharynx as per pre-operative assessment

## 2015-12-03 NOTE — Op Note (Signed)
Brief history: The patient is a 65 year old white male who has complained of chronic back, buttock, and leg pain consistent with neurogenic claudication. He has failed medical management and was worked up with a lumbar MRI and lumbar x-rays. These demonstrated a lumbar spondylolisthesis, spinal stenosis, etc. I discussed the situation with the patient and his wife. We discussed the various treatment options. The patient has decided to proceed with surgery after weighing the risks, benefits, and alternatives.  Preoperative diagnosis: L2-3, L3-4 and L4-5 spondylolisthesis, Degenerative disc disease, spinal stenosis compressing L2, L3, L4 and L5 nerve roots; lumbago; lumbar radiculopathy  Postoperative diagnosis: The same  Procedure: L3 and L4 laminectomy with bilateral L2 Laminotomy/foraminotomies to decompress the bilateral L2, L3, L4 and L5 nerve roots(the work required to do this was in addition to the work required to do the posterior lumbar interbody fusion because of the patient's spinal stenosis, facet arthropathy. Etc. requiring a wide decompression of the nerve roots.); L2-3, L3-4 and L4-5 posterior lumbar interbody fusion with local morselized autograft bone and Kinnex graft extender; insertion of interbody prosthesis at L2-3, L3-4 and L4-5 (globus peek expandable interbody prosthesis); posterior segmental instrumentation from L2 to L5 with globus titanium pedicle screws and rods; posterior lateral arthrodesis at L2-3, L3-4 and L4-5 with local morselized autograft bone and Kinnex bone graft extender.  Surgeon: Dr. Earle Gell  Asst.: Dr. Consuella Lose  Anesthesia: Gen. endotracheal  Estimated blood loss: 350 mL  Drains: One medium Hemovac  Complications: None  Description of procedure: The patient was brought to the operating room by the anesthesia team. General endotracheal anesthesia was induced. The patient was turned to the prone position on the Wilson frame. The patient's  lumbosacral region was then prepared with Betadine scrub and Betadine solution. Sterile drapes were applied.  I then injected the area to be incised with Marcaine with epinephrine solution. I then used the scalpel to make a linear midline incision over the L2-3, L3-4 and L4-5 interspace. I then used electrocautery to perform a bilateral subperiosteal dissection exposing the spinous process and lamina of L2-L5. We then obtained intraoperative radiograph to confirm our location. We then inserted the Verstrac retractor to provide exposure. I incised the interspinous ligament at L2-3, L3-4 and L4-5 with a scalpel. I used the [employer] were to remove the spinous process and part of the lamina at L3 and L4.  I began the decompression by using the high speed drill to perform laminotomies at L2-3, L3-4 and L4-5 bilaterally. We then used the Kerrison punches to complete the laminectomy at L3 and L4 and to widen the L2 laminotomies and removed the ligamentum flavum at L2-3, L3-4 and L4-5. We used the Kerrison punches to remove the medial facets at L2-3, L3-4 and L4-5. We performed wide foraminotomies about the bilateral L2, L3, L4 and L5 nerve roots completing the decompression.  We now turned our attention to the posterior lumbar interbody fusion. I used a scalpel to incise the intervertebral disc at L2-3, L3-4 and L4-5 bilaterally. I then performed a partial intervertebral discectomy at L2-3, L3-4 and L4-5 bilaterally using the pituitary forceps. We prepared the vertebral endplates at X33443, X33443 and L4-5 bilaterally for the fusion by removing the soft tissues with the curettes. We then used the trial spacers to pick the appropriate sized interbody prosthesis. We prefilled his prosthesis with a combination of local morselized autograft bone that we obtained during the decompression as well as Kinnex bone graft extender. We inserted the prefilled prosthesis into  the interspace at L2-3, L3-4 and L4-5, we then  expanded the prosthesis.. There was a good snug fit of the prosthesis in the interspace. We then filled and the remainder of the intervertebral disc space with local morselized autograft bone and Kinnex. This completed the posterior lumbar interbody arthrodesis.  We now turned attention to the instrumentation. Under fluoroscopic guidance we cannulated the bilateral L2, L3, L4 and L5 pedicles with the bone probe. We then removed the bone probe. We then tapped the pedicle with a 6.5 millimeter tap. We then removed the tap. We probed inside the tapped pedicle with a ball probe to rule out cortical breaches. We then inserted a 7.5 x 55 millimeter pedicle screw into the L2, L3, L4 and L5 pedicles bilaterally under fluoroscopic guidance. We then palpated along the medial aspect of the pedicles to rule out cortical breaches. There were none. The nerve roots were not injured. We then connected the unilateral pedicle screws with a lordotic rod. We compressed the construct and secured the rod in place with the caps. We then tightened the caps appropriately. We placed a cross connector between the rods. This completed the instrumentation from L2-L5.  We now turned our attention to the posterior lateral arthrodesis at L2-3, L3-4 and L4-5. We used the high-speed drill to decorticate the remainder of the facets, pars, transverse process at L2-3, L3-4 and L4-5 bilaterally. We then applied a combination of local morselized autograft bone and Kinnex bone graft extender over these decorticated posterior lateral structures. This completed the posterior lateral arthrodesis.  We then obtained hemostasis using bipolar electrocautery. We irrigated the wound out with bacitracin solution. We inspected the thecal sac and nerve roots and noted they were well decompressed. We then removed the retractor. I placed vancomycin powder in the wound. We placed a medium Hemovac drain in the epidural space and tunneled out through separate stab  wound. We reapproximated patient's thoracolumbar fascia with interrupted #1 Vicryl suture. We reapproximated patient's subcutaneous tissue with interrupted 2-0 Vicryl suture. The reapproximated patient's skin with Steri-Strips and benzoin. The wound was then coated with bacitracin ointment. A sterile dressing was applied. The drapes were removed. The patient was subsequently returned to the supine position where they were extubated by the anesthesia team. He was then transported to the post anesthesia care unit in stable condition. All sponge instrument and needle counts were reportedly correct at the end of this case.

## 2015-12-04 LAB — BASIC METABOLIC PANEL
Anion gap: 9 (ref 5–15)
BUN: 12 mg/dL (ref 6–20)
CHLORIDE: 109 mmol/L (ref 101–111)
CO2: 23 mmol/L (ref 22–32)
Calcium: 8.6 mg/dL — ABNORMAL LOW (ref 8.9–10.3)
Creatinine, Ser: 0.93 mg/dL (ref 0.61–1.24)
GFR calc Af Amer: >60 mL/min (ref >60)
GLUCOSE: 144 mg/dL — AB (ref 65–99)
POTASSIUM: 4.4 mmol/L (ref 3.5–5.1)
Sodium: 141 mmol/L (ref 135–145)

## 2015-12-04 LAB — CBC
HEMATOCRIT: 30.9 % — AB (ref 39.0–52.0)
Hemoglobin: 10.3 g/dL — ABNORMAL LOW (ref 13.0–17.0)
MCH: 30.9 pg (ref 26.0–34.0)
MCHC: 33.3 g/dL (ref 30.0–36.0)
MCV: 92.8 fL (ref 78.0–100.0)
PLATELETS: 121 10*3/uL — AB (ref 150–400)
RBC: 3.33 MIL/uL — AB (ref 4.22–5.81)
RDW: 12.9 % (ref 11.5–15.5)
WBC: 8 10*3/uL (ref 4.0–10.5)

## 2015-12-04 NOTE — Progress Notes (Signed)
Utilization review completed.  

## 2015-12-04 NOTE — Progress Notes (Signed)
Patient ID: Jeffrey [name], male   DOB: 1951-07-25, 65 y.o.   MRN: [id number] Subjective:  The patient is alert and pleasant.  He looks well.  His back is appropriately sore.  He is pleased that his leg pain and numbness is gone since surgery.  Objective: Vital signs in last 24 hours: Temp:  [97.9 F (36.6 C)-98.9 F (37.2 C)] 98.9 F (37.2 C) (02/14 0829) Pulse Rate:  [74-113] 83 (02/14 0829) Resp:  [8-20] 20 (02/14 0829) BP: (111-150)/(56-87) 116/58 mmHg (02/14 0829) SpO2:  [93 %-99 %] 98 % (02/14 0829) Arterial Line BP: (132-145)/(68-71) 145/68 mmHg (02/13 1830)  Intake/Output from previous day: 02/13 0701 - 02/14 0700 In: 2900 [I.V.:2400; IV Piggyback:500] Out: 3295 [Urine:2275; Drains:620; Blood:400] Intake/Output this shift:    Physical exam The patient is alert and pleasant.  He is moving his lower extremities well.  His dressing is clean and dry.  Lab Results:  Recent Labs  12/04/15 0131  WBC 8.0  HGB 10.3*  HCT 30.9*  PLT 121*   BMET  Recent Labs  12/04/15 0131  NA 141  K 4.4  CL 109  CO2 23  GLUCOSE 144*  BUN 12  CREATININE 0.93  CALCIUM 8.6*    Studies/Results: Dg Lumbar Spine 2-3 Views  12/03/2015  CLINICAL DATA:  L2-5 PLIF EXAM: LUMBAR SPINE - 2-3 VIEW; DG C-ARM 61-120 MIN FLUOROSCOPY TIME:  48 seconds COMPARISON:  Intraoperative lumbar spine radiograph dated 12/02/2005 FINDINGS: Intraoperative frontal and lateral fluoroscopic images during L2-5 PLIF. IMPRESSION: Intraoperative fluoroscopic images, as above. Electronically Signed   By: Julian Hy M.D.   On: 12/03/2015 17:12   Dg Lumbar Spine 1 View  12/03/2015  CLINICAL DATA:  Missing drill bit. EXAM: LUMBAR SPINE - 1 VIEW COMPARISON:  None. FINDINGS: Vertebral body heights are maintained. Posterior lumbar interbody fusion from L2 through L5 with bilateral pedicle screws at each level. No hardware failure complication. No metallic foreign body to suggest a drill bit. IMPRESSION: No metallic  foreign body to suggest a drill bit. Electronically Signed   By: Kathreen Devoid   On: 12/03/2015 18:07   Dg Lumbar Spine 1 View  12/03/2015  CLINICAL DATA:  Surgical posterior fusion of L2 through L5. EXAM: LUMBAR SPINE - 1 VIEW COMPARISON:  MRI of September 26, 2015. FINDINGS: Single intraoperative cross-table lateral projection of the lumbar spine demonstrates surgical probe directed toward the posterior elements of L3. IMPRESSION: Surgical localization as described above. Electronically Signed   By: Marijo Conception, M.D.   On: 12/03/2015 14:28   Dg C-arm 61-120 Min  12/03/2015  CLINICAL DATA:  L2-5 PLIF EXAM: LUMBAR SPINE - 2-3 VIEW; DG C-ARM 61-120 MIN FLUOROSCOPY TIME:  48 seconds COMPARISON:  Intraoperative lumbar spine radiograph dated 12/02/2005 FINDINGS: Intraoperative frontal and lateral fluoroscopic images during L2-5 PLIF. IMPRESSION: Intraoperative fluoroscopic images, as above. Electronically Signed   By: Julian Hy M.D.   On: 12/03/2015 17:12    Assessment/Plan: Postop day #1: The patient is doing well.  We will discontinue his Hemovac.  He will likely come tomorrow.  I gave him his discharge instructions.  I will ask Dr.  Kathyrn Sheriff to follow the patient in my absence.   LOS: 1 day     Ayse Mccartin D 12/04/2015, 8:50 AM

## 2015-12-04 NOTE — Evaluation (Signed)
Physical Therapy Evaluation Patient Details Name: Jeffrey [name] MRN: [id number] DOB: 12-06-1950 Today's Date: 12/04/2015   History of Present Illness  The patient is a 65 year old white male who has complained of back, buttock, and leg pain consistent with neurogenic claudication. He has failed medical management and was worked up with a lumbar MRI and lumbar x-rays. This demonstrated spinal stenosis, spondylolisthesis, etc. at L2-3, L3-4 and L4-5. I discussed the various treatment options with the patient including surgery. He has weighed the risks, benefits, and alternative surgery and decided proceed with a lumbar decompression, instrumentation, and fusion.   Clinical Impression  Pt presents with dependencies in mobility affecting his independence secondary to back surgery. Pt would benefit from skilled PT to maximize mobility and improve independence for return home with his spouse. Pt is limited by acute surgical pain and was unable to tolerate step training this am. Recommend d/c in the am after PT visit to reinforce back precaution with mobility and step training. Recommend ambulation with staff or spouse on the unit until d/c home. Do not feel pt will need any follow-up therapy.    Follow Up Recommendations No PT follow up    Equipment Recommendations  None recommended by PT    Recommendations for Other Services       Precautions / Restrictions Precautions Precautions: Back Precaution Booklet Issued: Yes (comment) Precaution Comments: handout given and back precaution reviewed Required Braces or Orthoses: Spinal Brace Spinal Brace: Lumbar corset;Applied in sitting position Restrictions Weight Bearing Restrictions: No      Mobility  Bed Mobility Overal bed mobility: Needs Assistance Bed Mobility: Supine to Sit     Supine to sit: Min assist     General bed mobility comments: min assistance to guide shoulders and hips over for log roll technique  Transfers Overall  transfer level: Needs assistance Equipment used: None Transfers: Sit to/from Stand Sit to Stand: Min guard         General transfer comment: cues for hand placement  Ambulation/Gait Ambulation/Gait assistance: Min guard Ambulation Distance (Feet): 100 Feet Assistive device: 1 person hand held assist Gait Pattern/deviations: Step-through pattern;Decreased stride length   Gait velocity interpretation: Below normal speed for age/gender General Gait Details: guarded posture, little to no trunk rotation  Stairs            Wheelchair Mobility    Modified Rankin (Stroke Patients Only)       Balance Overall balance assessment: Needs assistance Sitting-balance support: No upper extremity supported Sitting balance-Leahy Scale: Fair     Standing balance support: No upper extremity supported Standing balance-Leahy Scale: Fair                               Pertinent Vitals/Pain Pain Assessment: 0-10 Pain Score: 5  Pain Descriptors / Indicators: Aching;Guarding Pain Intervention(s): Limited activity within patient's tolerance;Monitored during session    Home Living Family/patient expects to be discharged to:: Private residence Living Arrangements: Spouse/significant other Available Help at Discharge: Family Type of Home: House Home Access: Stairs to enter   Technical brewer of Steps: 4-5 Home Layout: One level Home Equipment: None      Prior Function Level of Independence: Independent               Hand Dominance        Extremity/Trunk Assessment   Upper Extremity Assessment: Defer to OT evaluation  Lower Extremity Assessment: Overall WFL for tasks assessed         Communication   Communication: No difficulties  Cognition Arousal/Alertness: Awake/alert Behavior During Therapy: WFL for tasks assessed/performed Overall Cognitive Status: Within Functional Limits for tasks assessed                       General Comments General comments (skin integrity, edema, etc.): pt guarded secondary to pain. Pt not ready for step training this am. Will defer until tomorrow am prior to d/c home.    Exercises        Assessment/Plan    PT Assessment Patient needs continued PT services  PT Diagnosis Difficulty walking;Acute pain   PT Problem List Decreased activity tolerance;Decreased balance;Decreased mobility;Decreased knowledge of precautions  PT Treatment Interventions Gait training;Stair training;Functional mobility training;Therapeutic activities;Therapeutic exercise;Balance training;Patient/family education;DME instruction   PT Goals (Current goals can be found in the Care Plan section) Acute Rehab PT Goals Patient Stated Goal: To walk and go home soon. PT Goal Formulation: With patient Time For Goal Achievement: 12/11/15 Potential to Achieve Goals: Good    Frequency Min 5X/week   Barriers to discharge        Co-evaluation               End of Session Equipment Utilized During Treatment: Back brace Activity Tolerance: Patient limited by pain Patient left: in bed;with call bell/phone within reach;with family/visitor present Nurse Communication: Mobility status         Time: [id number] PT Time Calculation (min) (ACUTE ONLY): 40 min   Charges:   PT Evaluation $PT Eval Moderate Complexity: 1 Procedure PT Treatments $Gait Training: 8-22 mins $Self Care/Home Management: 8-22   PT G Codes:        Lelon Mast 12/04/2015, 12:03 PM

## 2015-12-05 MED ORDER — MAGNESIUM CITRATE PO SOLN
1.0000 | Freq: Once | ORAL | Status: AC
  Administered 2015-12-05: 1 via ORAL
  Filled 2015-12-05: qty 296

## 2015-12-05 MED FILL — Heparin Sodium (Porcine) Inj 1000 Unit/ML: INTRAMUSCULAR | Qty: 30 | Status: AC

## 2015-12-05 MED FILL — Sodium Chloride IV Soln 0.9%: INTRAVENOUS | Qty: 1000 | Status: AC

## 2015-12-05 NOTE — Progress Notes (Addendum)
Physical Therapy Treatment Patient Details Name: Jeffrey [name] MRN: [id number] DOB: 1951/03/03 Today's Date: 12/05/2015    History of Present Illness The patient is a 65 year old white male s/p lumbar decompression, instrumentation, and fusion.     PT Comments    Patient seen for stair negotiation and progression of activity. Patient ambulated increased distance and performed stair negotiation. Patient very guarded and self limiting with activity and modestly receptive to cues, educated extensively on the need for increased activity and positional changes. Will continue to see and progress as tolerated.   Follow Up Recommendations  No PT follow up     Equipment Recommendations  None recommended by PT    Recommendations for Other Services       Precautions / Restrictions Precautions Precautions: Back Precaution Booklet Issued: Yes (comment) Precaution Comments: handout given and back precaution reviewed Required Braces or Orthoses: Spinal Brace Spinal Brace: Lumbar corset;Applied in sitting position Restrictions Weight Bearing Restrictions: No    Mobility  Bed Mobility Overal bed mobility: Needs Assistance Bed Mobility: Supine to Sit;Sit to Supine     Supine to sit: Min assist Sit to supine: Min assist   General bed mobility comments: min assistance to guide shoulders and hips over for log roll technique, patient educated to not hesitate with movement and positional changes  Transfers Overall transfer level: Needs assistance Equipment used: None Transfers: Sit to/from Stand Sit to Stand: Min guard         General transfer comment: verbal cues for hand placement, educated to not hesitate and pause mid motion  Ambulation/Gait Ambulation/Gait assistance: Min guard Ambulation Distance (Feet): 310 Feet Assistive device: Rolling walker (2 wheeled) Gait Pattern/deviations: Step-through pattern;Decreased stride length   Gait velocity interpretation: Below normal  speed for age/gender General Gait Details: extremely guarded posture, reluctant to increase cadence despite cues, educated regarding need for normalized gait speed. Little to no trunk rotation   Stairs Stairs: Yes Stairs assistance: Min guard Stair Management: One rail Right;Step to pattern;Forwards Number of Stairs: 4 General stair comments: patient anxious and reluctant but able to perform with minimal cues for sequencing and no physical assist  Wheelchair Mobility    Modified Rankin (Stroke Patients Only)       Balance Overall balance assessment: Needs assistance Sitting-balance support: No upper extremity supported Sitting balance-Leahy Scale: Fair     Standing balance support: No upper extremity supported;During functional activity Standing balance-Leahy Scale: Fair                      Cognition Arousal/Alertness: Awake/alert Behavior During Therapy: Anxious Overall Cognitive Status: Within Functional Limits for tasks assessed                      Exercises      General Comments General comments (skin integrity, edema, etc.): patient and spouse educated at length regarding the need for increased mobility, positional changes, and safety with mobility. Patient very reluctant and minimally receptive to education.      Pertinent Vitals/Pain Pain Assessment: 0-10 Pain Score: 4  Pain Descriptors / Indicators: Aching;Guarding Pain Intervention(s): Limited activity within patient's tolerance;Monitored during session;Repositioned    Home Living                      Prior Function            PT Goals (current goals can now be found in the care plan section) Acute Rehab PT Goals Patient  Stated Goal: To walk and go home soon. PT Goal Formulation: With patient Time For Goal Achievement: 12/11/15 Potential to Achieve Goals: Good Progress towards PT goals: Progressing toward goals (self limiting)    Frequency  Min 5X/week    PT Plan  Current plan remains appropriate    Co-evaluation             End of Session Equipment Utilized During Treatment: Back brace Activity Tolerance: Patient limited by pain Patient left: in bed;with call bell/phone within reach;with family/visitor present     Time: [id number] PT Time Calculation (min) (ACUTE ONLY): 25 min  Charges:  $Gait Training: 8-22 mins $Self Care/Home Management: 8-22                    G CodesDuncan Dull 12/10/2015, 9:06 AM Alben Deeds, PT DPT  [phone]

## 2015-12-05 NOTE — Progress Notes (Signed)
No issues overnight. Pt continues to report severe back pain, but has had no further leg pain.  EXAM:  BP 120/63 mmHg  Pulse 88  Temp(Src) 99.4 F (37.4 C) (Oral)  Resp 20  Ht 5\' 8"  (1.727 m)  Wt 92.08 kg (203 lb)  BMI 30.87 kg/m2  SpO2 97%  Awake, alert, oriented  Speech fluent, appropriate  CN grossly intact  5/5 BUE/BLE  Wound c/d/i  IMPRESSION:  65 y.o. male POD#2 s/p 3 level PLIF. Has postoperative back pain  PLAN: - Would like to work with PT/OT today, likely home tomorrow

## 2015-12-06 MED ORDER — DIAZEPAM 5 MG PO TABS: 5.0000 mg | ORAL_TABLET | Freq: Four times a day (QID) | ORAL | Status: DC | PRN

## 2015-12-06 MED ORDER — OXYCODONE-ACETAMINOPHEN 5-325 MG PO TABS: 1.0000 | ORAL_TABLET | ORAL | Status: DC | PRN

## 2015-12-06 NOTE — Discharge Instructions (Signed)
Wound Care Keep incision covered and dry for two days.   Do not put any creams, lotions, or ointments on incision. Leave steri-strips on back.  They will fall off by themselves. Activity Walk each and every day, increasing distance each day. No lifting greater than 5 lbs.  Avoid bending, lifting and twisting. No driving for 2 weeks; may ride as a passenger locally. If provided with back brace, wear when out of bed.  It is not necessary to wear brace in bed. Diet Resume your normal diet.  Return to Work Will be discussed at you follow up appointment. Call Your Doctor If Any of These Occur Redness, drainage, or swelling at the wound.  Temperature greater than 101 degrees. Severe pain not relieved by pain medication. Incision starts to come apart. Follow Up Appt Call today for appointment in 3 weeks [id number]) or for problems.  If you have any hardware placed in your spine, you will need an x-ray before your appointment.

## 2015-12-06 NOTE — Progress Notes (Signed)
Physical Therapy Treatment Patient Details Name: Jeffrey [name] MRN: [id number] DOB: [birth year] Today's Date: 12/06/2015    History of Present Illness The patient is a 65 year old white male who has complained of back, buttock, and leg pain consistent with neurogenic claudication. He has failed medical management and was worked up with a lumbar MRI and lumbar x-rays. This demonstrated spinal stenosis, spondylolisthesis, etc. at L2-3, L3-4 and L4-5. I discussed the various treatment options with the patient including surgery. He has weighed the risks, benefits, and alternative surgery and decided proceed with a lumbar decompression, instrumentation, and fusion.     PT Comments    Pt progressing towards physical therapy goals. Was able to negotiate stairs well with HHA for added support. Reviewed safety precautions on stairs with wife upon return to the room. Pt somewhat distracted by need to have a bowel movement before d/c. Pt was left on commode at end of session. Will continue to follow.   Follow Up Recommendations  No PT follow up     Equipment Recommendations  None recommended by PT    Recommendations for Other Services       Precautions / Restrictions Precautions Precautions: Back Precaution Booklet Issued: Yes (comment) Precaution Comments: handout given and back precaution reviewed Required Braces or Orthoses: Spinal Brace Spinal Brace: Lumbar corset;Applied in sitting position Restrictions Weight Bearing Restrictions: No    Mobility  Bed Mobility               General bed mobility comments: Pt sitting up on EOB when PT arrived.   Transfers Overall transfer level: Needs assistance Equipment used: Rolling walker (2 wheeled) Transfers: Sit to/from Stand Sit to Stand: Min guard         General transfer comment: VC's for hand placement on seated surface for safety.   Ambulation/Gait Ambulation/Gait assistance: Supervision Ambulation Distance (Feet): 400  Feet Assistive device: Rolling walker (2 wheeled) Gait Pattern/deviations: Step-through pattern;Decreased stride length Gait velocity: Decreased Gait velocity interpretation: Below normal speed for age/gender General Gait Details: Slow and guarded. Min guard initially with progression to supervision for safety. VC's for increased gait speed.    Stairs Stairs: Yes Stairs assistance: Min guard Stair Management: One rail Right;Step to pattern;Forwards Number of Stairs: 4 General stair comments: HHA on the L for support. VC's for sequencing and safety.   Wheelchair Mobility    Modified Rankin (Stroke Patients Only)       Balance Overall balance assessment: Needs assistance Sitting-balance support: Feet supported;No upper extremity supported Sitting balance-Leahy Scale: Fair     Standing balance support: No upper extremity supported Standing balance-Leahy Scale: Fair                      Cognition Arousal/Alertness: Awake/alert Behavior During Therapy: Anxious Overall Cognitive Status: Within Functional Limits for tasks assessed                      Exercises      General Comments        Pertinent Vitals/Pain Pain Assessment: Faces Faces Pain Scale: Hurts a little bit Pain Location: Back Pain Descriptors / Indicators: Operative site guarding;Sore Pain Intervention(s): Monitored during session;Limited activity within patient's tolerance;Repositioned    Home Living                      Prior Function            PT Goals (current goals can now be  found in the care plan section) Acute Rehab PT Goals Patient Stated Goal: To walk and go home soon. PT Goal Formulation: With patient Time For Goal Achievement: 12/11/15 Potential to Achieve Goals: Good Progress towards PT goals: Progressing toward goals    Frequency  Min 5X/week    PT Plan Current plan remains appropriate    Co-evaluation             End of Session Equipment  Utilized During Treatment: Back brace Activity Tolerance: Patient limited by pain;Other (comment) (Need to use the bathroom) Patient left: Other (comment) (Sitting on the commode)     Time: 1020-1046 PT Time Calculation (min) (ACUTE ONLY): 26 min  Charges:  $Gait Training: 23-37 mins                    G Codes:      Rolinda Roan 12/28/15, 10:52 AM   Rolinda Roan, PT, DPT Acute Rehabilitation Services Pager: [phone]

## 2015-12-06 NOTE — Progress Notes (Signed)
No issues overnight. Pt reports starting to have a BM. Somewhat improved back pain today.  EXAM:  BP 122/61 mmHg  Pulse 93  Temp(Src) 99.6 F (37.6 C) (Oral)  Resp 16  Ht 5\' 8"  (1.727 m)  Wt 92.08 kg (203 lb)  BMI 30.87 kg/m2  SpO2 96%  Awake, alert, oriented  Speech fluent, appropriate  CN grossly intact  5/5 BUE/BLE   IMPRESSION:  65 y.o. male POD# 3 s/p 3 level PLIF, doing well  PLAN: - home today

## 2015-12-06 NOTE — Discharge Summary (Signed)
  Physician Discharge Summary  Patient ID: Jeffrey [name] MRN: [id number] DOB/AGE: 1951-09-12 64 y.o.  Admit date: 12/03/2015 Discharge date: 12/06/2015  Admission Diagnoses: Lumbar spondylosis  Discharge Diagnoses: Same Active Problems:   Spondylolisthesis of lumbar region   Discharged Condition: Stable  Hospital Course:  [name] is a 65 y.o. male electively admitted after uncomplicated [id number] decompression/fusion. He had an uneventful hospital course, ambulating well with PT. Pain was controlled with oral meds. He was voiding normally.  Treatments: Surgery - L2-5 PLIF  Discharge Exam: Blood pressure 122/61, pulse 93, temperature 99.6 F (37.6 C), temperature source Oral, resp. rate 16, height 5\' 8"  (1.727 m), weight 92.08 kg (203 lb), SpO2 96 %. Awake, alert, oriented Speech fluent, appropriate CN grossly intact 5/5 BUE/BLE Wound c/d/i  Disposition: 01-Home or Self Care     Medication List    STOP taking these medications        aspirin EC 81 MG tablet      TAKE these medications        diazepam 5 MG tablet  Commonly known as:  VALIUM  Take 1 tablet (5 mg total) by mouth every 6 (six) hours as needed for muscle spasms.     fluticasone 50 MCG/ACT nasal spray  Commonly known as:  FLONASE  Place 1 spray into the nose daily as needed. Allergies     omeprazole-sodium bicarbonate 40-1100 MG capsule  Commonly known as:  ZEGERID  Take 1 capsule by mouth daily as needed (for acid reflux).     oxyCODONE-acetaminophen 5-325 MG tablet  Commonly known as:  PERCOCET/ROXICET  Take 1-2 tablets by mouth every 4 (four) hours as needed for moderate pain.     PATADAY 0.2 % Soln  Generic drug:  Olopatadine HCl  Apply 1 drop to eye as needed.     rosuvastatin 10 MG tablet  Commonly known as:  CRESTOR  Take 10 mg by mouth at bedtime.     senna 8.6 MG tablet  Commonly known as:  SENOKOT  Take 1 tablet by mouth every other day.           Follow-up  Information    Follow up with Ophelia Charter, MD.   Specialty:  Neurosurgery   Contact information:   [address] [place] [address] [place] [address] [phone]       Signed: Jairo Ben 12/06/2015, 3:31 PM

## 2015-12-06 NOTE — Progress Notes (Signed)
Patient alert and oriented, mae's well, voiding adequate amount of urine, swallowing without difficulty, c/o mild pain. Patient discharged home with family. Script and discharged instructions given to patient. Patient and family stated understanding of d/c instructions given and has an appointment with MD.   

## 2015-12-25 DIAGNOSIS — M5416 Radiculopathy, lumbar region: Secondary | ICD-10-CM | POA: Diagnosis not present

## 2015-12-25 DIAGNOSIS — M4316 Spondylolisthesis, lumbar region: Secondary | ICD-10-CM | POA: Diagnosis not present

## 2015-12-25 DIAGNOSIS — Z6830 Body mass index (BMI) 30.0-30.9, adult: Secondary | ICD-10-CM | POA: Diagnosis not present

## 2015-12-25 DIAGNOSIS — M545 Low back pain: Secondary | ICD-10-CM | POA: Diagnosis not present

## 2016-01-20 ENCOUNTER — Encounter: Payer: Self-pay | Admitting: Internal Medicine

## 2016-01-20 NOTE — Progress Notes (Signed)
Pt called spasm and anxiety in am while trying to bathe. Has had diazepam 5 mg - cutting pills up - only taking about 1/3in am once daily.  He has asked me for a refill until seen by Dr. Arnoldo Morale.  He is no longer taking narcotics for pain.  I called diazepam 2 mg tablet to [id number] no refills. Pt admonished not to take opiods while using diazepam

## 2016-04-02 DIAGNOSIS — R030 Elevated blood-pressure reading, without diagnosis of hypertension: Secondary | ICD-10-CM | POA: Diagnosis not present

## 2016-04-02 DIAGNOSIS — M4316 Spondylolisthesis, lumbar region: Secondary | ICD-10-CM | POA: Diagnosis not present

## 2016-04-02 DIAGNOSIS — M545 Low back pain: Secondary | ICD-10-CM | POA: Diagnosis not present

## 2016-04-02 DIAGNOSIS — Z6829 Body mass index (BMI) 29.0-29.9, adult: Secondary | ICD-10-CM | POA: Diagnosis not present

## 2016-09-30 DIAGNOSIS — R030 Elevated blood-pressure reading, without diagnosis of hypertension: Secondary | ICD-10-CM | POA: Diagnosis not present

## 2016-09-30 DIAGNOSIS — M4316 Spondylolisthesis, lumbar region: Secondary | ICD-10-CM | POA: Diagnosis not present

## 2016-10-17 DIAGNOSIS — Z23 Encounter for immunization: Secondary | ICD-10-CM | POA: Diagnosis not present

## 2016-10-17 DIAGNOSIS — E782 Mixed hyperlipidemia: Secondary | ICD-10-CM | POA: Diagnosis not present

## 2016-10-17 DIAGNOSIS — Z0001 Encounter for general adult medical examination with abnormal findings: Secondary | ICD-10-CM | POA: Diagnosis not present

## 2016-10-17 DIAGNOSIS — R7301 Impaired fasting glucose: Secondary | ICD-10-CM | POA: Diagnosis not present

## 2016-10-17 DIAGNOSIS — M4800 Spinal stenosis, site unspecified: Secondary | ICD-10-CM | POA: Diagnosis not present

## 2016-12-30 DIAGNOSIS — Z8719 Personal history of other diseases of the digestive system: Secondary | ICD-10-CM | POA: Diagnosis not present

## 2016-12-30 DIAGNOSIS — Z9889 Other specified postprocedural states: Secondary | ICD-10-CM | POA: Diagnosis not present

## 2016-12-30 DIAGNOSIS — R1032 Left lower quadrant pain: Secondary | ICD-10-CM | POA: Diagnosis not present

## 2017-01-02 ENCOUNTER — Other Ambulatory Visit: Payer: Self-pay | Admitting: General Surgery

## 2017-01-02 DIAGNOSIS — R109 Unspecified abdominal pain: Secondary | ICD-10-CM

## 2017-01-07 ENCOUNTER — Other Ambulatory Visit: Payer: Commercial Managed Care - HMO

## 2017-01-12 ENCOUNTER — Ambulatory Visit
Admission: RE | Admit: 2017-01-12 | Discharge: 2017-01-12 | Disposition: A | Discharge status: Home / Self Care | Payer: Medicare Other | Source: Ambulatory Visit | Attending: General Surgery | Admitting: General Surgery

## 2017-01-12 DIAGNOSIS — N281 Cyst of kidney, acquired: Secondary | ICD-10-CM | POA: Diagnosis not present

## 2017-01-12 DIAGNOSIS — R109 Unspecified abdominal pain: Secondary | ICD-10-CM

## 2017-01-12 MED ORDER — IOPAMIDOL (ISOVUE-300) INJECTION 61%
100.0000 mL | Freq: Once | INTRAVENOUS | Status: AC | PRN
  Administered 2017-01-12: 100 mL via INTRAVENOUS

## 2017-01-26 ENCOUNTER — Other Ambulatory Visit: Payer: Self-pay | Admitting: General Surgery

## 2017-01-26 DIAGNOSIS — Z9889 Other specified postprocedural states: Secondary | ICD-10-CM | POA: Diagnosis not present

## 2017-01-26 DIAGNOSIS — R1032 Left lower quadrant pain: Secondary | ICD-10-CM | POA: Diagnosis not present

## 2017-01-26 DIAGNOSIS — Z8719 Personal history of other diseases of the digestive system: Secondary | ICD-10-CM | POA: Diagnosis not present

## 2017-01-30 ENCOUNTER — Encounter (HOSPITAL_COMMUNITY): Payer: Self-pay | Admitting: *Deleted

## 2017-01-31 NOTE — H&P (Signed)
Jeffrey [name] Location: [facility] Patient #: [phone] DOB: 03-18-1951 Married / Language: English / Race: White Male        History of Present Illness   The patient is a 67 year old male who presents with non-malignant abdominal pain. This is a very pleasant 66 year old man who returns to discuss management of his left lower quadrant abdominal wall pain. Dr. Mayford Knife referred him to me. Dr. Edwyna Ready while is his PCP. Dr. Arnoldo Morale is his spine surgery.  Past history significant for umbilical hernia repair by Dr. Rise Patience many years ago. 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 He had delayed wound healing. On October 22, 2012 performed laparoscopic repair of ventral incisional hernia with mesh. I described a small defect, incarcerated omentum which was taken down. Repaired with a 20 cm x 15 cm of parietex composite mesh. Double crown technique a Pro-Tex and then 8 equidistant suture fixation using #1 Novafil.  He's had some LLQ neuropathic pain for 2 years. Has been getting much more frequent lately and in inhibiting him from working out in the gym. I examined him twice now and is 1 particular point which is a little bit tender. He describes the pain as burning and tingling.  We performed a CT scan of abdomen and pelvis. There was no specific abnormality to explain his pain. Certainly no recurrent hernia mass or fluid collection. No inflammatory changes in the abdomen.  I told him that it is possible he has a neuroma or entrapped nerve at the suture fixation site. We talked about a lot of options. We talked about doing nothing but he says it's too much of a problem for him. We talked about referral to pain clinic for injection therapy and he does not want to do that. We talked about trying gabapentin but he does not want to start any new medications. We talked  about surgery and I described cutting down on this point in the abdominal wall taking the suture possibly injecting the nerves with alcohol or ligation. I told him there was a 50% or greater chance that this would resolve his symptoms but certainly could not guarantee it. If the pain persists he'll need to see a pain clinic. He was happy with that. I discussed the indications, details, techniques, and numerous risk of the surgery with him. He is aware the risk of bleeding, infection, recurrence of the pain and other unforeseen problems. He understands all these issues. All his questions were answered. He agrees with this plan.   Allergies  No Known Allergies   Medication History  Rosuvastatin Calcium (10MG  Tablet, Oral) Active. Aspirin (81MG  Tablet, Oral) Active. Medications Reconciled  Vitals  Weight: 207 lb Height: 68in Body Surface Area: 2.07 m Body Mass Index: 31.47 kg/m  Pulse: 75 (Regular)  BP: 122/76 (Sitting, Left Arm, Standard)    Physical Exam  General Mental Status-Alert. General Appearance-Not in acute distress. Build & Nutrition-Well nourished. Posture-Normal posture. Gait-Normal.  Head and Neck Head-normocephalic, atraumatic with no lesions or palpable masses. Trachea-midline. Thyroid Gland Characteristics - normal size and consistency and no palpable nodules.  Chest and Lung Exam Chest and lung exam reveals -on auscultation, normal breath sounds, no adventitious sounds and normal vocal resonance.  Cardiovascular Cardiovascular examination reveals -normal heart sounds, regular rate and rhythm with no murmurs and femoral artery auscultation bilaterally reveals normal pulses, no bruits, no thrills.  Abdomen  Note: Soft. BMI 31.4. Examined supine and standing. Midline incision both above and below umbilicus well-healed. No hernias. He points to a specific point left lower quadrant which I think is at a suture fixation sites.  It is tender when I push hard with one finger. Nontender elsewhere. No masses. No organomegaly.   Neurologic Neurologic evaluation reveals -alert and oriented x 3 with no impairment of recent or remote memory, normal attention span and ability to concentrate, normal sensation and normal coordination.  Musculoskeletal Normal Exam - Bilateral-Upper Extremity Strength Normal and Lower Extremity Strength Normal.    Assessment & Plan  ABDOMINAL PAIN, LLQ (R10.32)   your CT scan looks good. There is no evidence of recurrent hernia or infection. The hernia repair is intact  Your left lower quadrant pain is described as a burning pain in a very localized area. He will also tender at one specific point I suspect neuropathic pain, possibly from the sutures used to repair the mesh I do not advise intra-abdominal surgery. We discussed the options of gabapentin medication, and usage and did not want to take any new medication  We discussed the options of referral to a plain clinic either before or after surgery to remove the suture you have stated you would like to have the surgery. This will be an outpatient surgery, cutting down on the tender area and hopefully removing the sutures that are present. I may tie off or inject the nerves with alcohol. You are aware that there is no guarantee that this will control your pain but hopefully better than 50% chance If the pain persists, you will need to be referred to a pain clinic. We have discussed the indications, techniques, and risk of the surgery in detail.  HISTORY OF BACK SURGERY [phone]) HISTORY OF DIVERTICULITIS (Z87.19)   Jeffrey [name] M. Dalbert Batman, M.D., [facility] General and Minimally invasive Surgery Breast and Colorectal Surgery Office:   [phone] Pager:   [phone]

## 2017-02-03 ENCOUNTER — Encounter (HOSPITAL_COMMUNITY)
Admission: RE | Disposition: A | Discharge status: Home / Self Care | Payer: Self-pay | Source: Ambulatory Visit | Attending: General Surgery

## 2017-02-03 ENCOUNTER — Ambulatory Visit (HOSPITAL_COMMUNITY): Payer: Medicare Other | Admitting: Certified Registered"

## 2017-02-03 ENCOUNTER — Encounter (HOSPITAL_COMMUNITY): Payer: Self-pay | Admitting: Urology

## 2017-02-03 ENCOUNTER — Ambulatory Visit (HOSPITAL_COMMUNITY)
Admission: RE | Admit: 2017-02-03 | Discharge: 2017-02-03 | Disposition: A | Discharge status: Home / Self Care | Payer: Medicare Other | Source: Ambulatory Visit | Attending: General Surgery | Admitting: General Surgery

## 2017-02-03 DIAGNOSIS — K219 Gastro-esophageal reflux disease without esophagitis: Secondary | ICD-10-CM | POA: Insufficient documentation

## 2017-02-03 DIAGNOSIS — Z7982 Long term (current) use of aspirin: Secondary | ICD-10-CM | POA: Insufficient documentation

## 2017-02-03 DIAGNOSIS — R109 Unspecified abdominal pain: Secondary | ICD-10-CM | POA: Diagnosis present

## 2017-02-03 DIAGNOSIS — R1032 Left lower quadrant pain: Secondary | ICD-10-CM | POA: Insufficient documentation

## 2017-02-03 DIAGNOSIS — R1031 Right lower quadrant pain: Secondary | ICD-10-CM | POA: Diagnosis not present

## 2017-02-03 DIAGNOSIS — D72829 Elevated white blood cell count, unspecified: Secondary | ICD-10-CM | POA: Diagnosis not present

## 2017-02-03 DIAGNOSIS — E785 Hyperlipidemia, unspecified: Secondary | ICD-10-CM | POA: Diagnosis not present

## 2017-02-03 DIAGNOSIS — G8928 Other chronic postprocedural pain: Secondary | ICD-10-CM | POA: Diagnosis not present

## 2017-02-03 DIAGNOSIS — L905 Scar conditions and fibrosis of skin: Secondary | ICD-10-CM | POA: Diagnosis not present

## 2017-02-03 HISTORY — PX: WOUND EXPLORATION: SHX6188

## 2017-02-03 LAB — CBC WITH DIFFERENTIAL/PLATELET
BASOS PCT: 0 %
Basophils Absolute: 0 10*3/uL (ref 0.0–0.1)
EOS ABS: 0.1 10*3/uL (ref 0.0–0.7)
Eosinophils Relative: 1 %
HEMATOCRIT: 41.5 % (ref 39.0–52.0)
Hemoglobin: 14.4 g/dL (ref 13.0–17.0)
Lymphocytes Relative: 28 %
Lymphs Abs: 1.7 10*3/uL (ref 0.7–4.0)
MCH: 32.1 pg (ref 26.0–34.0)
MCHC: 34.7 g/dL (ref 30.0–36.0)
MCV: 92.6 fL (ref 78.0–100.0)
MONO ABS: 0.2 10*3/uL (ref 0.1–1.0)
MONOS PCT: 4 %
Neutro Abs: 4.2 10*3/uL (ref 1.7–7.7)
Neutrophils Relative %: 67 %
Platelets: 138 10*3/uL — ABNORMAL LOW (ref 150–400)
RBC: 4.48 MIL/uL (ref 4.22–5.81)
RDW: 13.3 % (ref 11.5–15.5)
WBC: 6.2 10*3/uL (ref 4.0–10.5)

## 2017-02-03 LAB — COMPREHENSIVE METABOLIC PANEL
ALT: 27 U/L (ref 17–63)
ANION GAP: 12 (ref 5–15)
AST: 30 U/L (ref 15–41)
Albumin: 4.5 g/dL (ref 3.5–5.0)
Alkaline Phosphatase: 42 U/L (ref 38–126)
BILIRUBIN TOTAL: 0.9 mg/dL (ref 0.3–1.2)
BUN: 11 mg/dL (ref 6–20)
CO2: 23 mmol/L (ref 22–32)
Calcium: 9.4 mg/dL (ref 8.9–10.3)
Chloride: 106 mmol/L (ref 101–111)
Creatinine, Ser: 0.81 mg/dL (ref 0.61–1.24)
GFR calc non Af Amer: >60 mL/min (ref >60)
Glucose, Bld: 93 mg/dL (ref 65–99)
POTASSIUM: 4.1 mmol/L (ref 3.5–5.1)
Sodium: 141 mmol/L (ref 135–145)
TOTAL PROTEIN: 7.1 g/dL (ref 6.5–8.1)

## 2017-02-03 SURGERY — WOUND EXPLORATION
Anesthesia: General

## 2017-02-03 MED ORDER — CELECOXIB 200 MG PO CAPS
400.0000 mg | ORAL_CAPSULE | ORAL | Status: AC
  Administered 2017-02-03: 400 mg via ORAL

## 2017-02-03 MED ORDER — ONDANSETRON HCL 4 MG/2ML IJ SOLN
INTRAMUSCULAR | Status: DC | PRN
  Administered 2017-02-03: 4 mg via INTRAVENOUS

## 2017-02-03 MED ORDER — CEFAZOLIN SODIUM-DEXTROSE 2-4 GM/100ML-% IV SOLN
INTRAVENOUS | Status: AC
  Filled 2017-02-03: qty 100

## 2017-02-03 MED ORDER — FENTANYL CITRATE (PF) 100 MCG/2ML IJ SOLN
25.0000 ug | INTRAMUSCULAR | Status: DC | PRN
  Administered 2017-02-03: 25 ug via INTRAVENOUS

## 2017-02-03 MED ORDER — ACETAMINOPHEN 500 MG PO TABS
1000.0000 mg | ORAL_TABLET | ORAL | Status: AC
  Administered 2017-02-03: 1000 mg via ORAL

## 2017-02-03 MED ORDER — LACTATED RINGERS IV SOLN: INTRAVENOUS | Status: DC

## 2017-02-03 MED ORDER — HYDROCODONE-ACETAMINOPHEN 5-325 MG PO TABS
ORAL_TABLET | ORAL | Status: AC
  Administered 2017-02-03: 1 via ORAL
  Filled 2017-02-03: qty 1

## 2017-02-03 MED ORDER — HYDROMORPHONE HCL 1 MG/ML IJ SOLN: 0.2500 mg | INTRAMUSCULAR | Status: DC | PRN

## 2017-02-03 MED ORDER — OXYCODONE HCL 5 MG PO TABS: 5.0000 mg | ORAL_TABLET | ORAL | Status: DC | PRN

## 2017-02-03 MED ORDER — CELECOXIB 200 MG PO CAPS
ORAL_CAPSULE | ORAL | Status: AC
Start: 2017-02-03 — End: 2017-02-03
  Administered 2017-02-03: 400 mg via ORAL
  Filled 2017-02-03: qty 2

## 2017-02-03 MED ORDER — ONDANSETRON HCL 4 MG/2ML IJ SOLN
INTRAMUSCULAR | Status: AC
  Filled 2017-02-03: qty 2

## 2017-02-03 MED ORDER — BUPIVACAINE HCL (PF) 0.25 % IJ SOLN
INTRAMUSCULAR | Status: AC
Start: 2017-02-03 — End: ?
  Filled 2017-02-03: qty 30

## 2017-02-03 MED ORDER — GABAPENTIN 300 MG PO CAPS
300.0000 mg | ORAL_CAPSULE | ORAL | Status: AC
  Administered 2017-02-03: 300 mg via ORAL

## 2017-02-03 MED ORDER — SODIUM CHLORIDE 0.9% FLUSH: 3.0000 mL | INTRAVENOUS | Status: DC | PRN

## 2017-02-03 MED ORDER — CHLORHEXIDINE GLUCONATE CLOTH 2 % EX PADS: 6.0000 | MEDICATED_PAD | Freq: Once | CUTANEOUS | Status: DC

## 2017-02-03 MED ORDER — EPHEDRINE 5 MG/ML INJ
INTRAVENOUS | Status: AC
  Filled 2017-02-03: qty 10

## 2017-02-03 MED ORDER — PROPOFOL 10 MG/ML IV BOLUS
INTRAVENOUS | Status: AC
  Filled 2017-02-03: qty 20

## 2017-02-03 MED ORDER — FENTANYL CITRATE (PF) 250 MCG/5ML IJ SOLN
INTRAMUSCULAR | Status: AC
  Filled 2017-02-03: qty 5

## 2017-02-03 MED ORDER — HYDROCODONE-ACETAMINOPHEN 5-325 MG PO TABS
1.0000 | ORAL_TABLET | Freq: Four times a day (QID) | ORAL | Status: DC | PRN
  Administered 2017-02-03: 1 via ORAL

## 2017-02-03 MED ORDER — DEXAMETHASONE SODIUM PHOSPHATE 10 MG/ML IJ SOLN
INTRAMUSCULAR | Status: DC | PRN
  Administered 2017-02-03: 10 mg via INTRAVENOUS

## 2017-02-03 MED ORDER — ACETAMINOPHEN 650 MG RE SUPP: 650.0000 mg | RECTAL | Status: DC | PRN

## 2017-02-03 MED ORDER — ALCOHOL 98 % IV SOLN
10.0000 mL | Freq: Once | INTRAVENOUS | Status: AC
Start: 2017-02-03 — End: 2017-02-03
  Administered 2017-02-03: 10 mL
  Filled 2017-02-03: qty 10

## 2017-02-03 MED ORDER — DEXAMETHASONE SODIUM PHOSPHATE 10 MG/ML IJ SOLN
INTRAMUSCULAR | Status: AC
  Filled 2017-02-03: qty 1

## 2017-02-03 MED ORDER — LIDOCAINE 2% (20 MG/ML) 5 ML SYRINGE
INTRAMUSCULAR | Status: AC
  Filled 2017-02-03: qty 5

## 2017-02-03 MED ORDER — GABAPENTIN 300 MG PO CAPS
ORAL_CAPSULE | ORAL | Status: AC
  Administered 2017-02-03: 300 mg via ORAL
  Filled 2017-02-03: qty 1

## 2017-02-03 MED ORDER — SUGAMMADEX SODIUM 200 MG/2ML IV SOLN
INTRAVENOUS | Status: AC
  Filled 2017-02-03: qty 2

## 2017-02-03 MED ORDER — LACTATED RINGERS IV SOLN
INTRAVENOUS | Status: DC
  Administered 2017-02-03 (×2): via INTRAVENOUS

## 2017-02-03 MED ORDER — EPHEDRINE SULFATE 50 MG/ML IJ SOLN
INTRAMUSCULAR | Status: DC | PRN
  Administered 2017-02-03: 10 mg via INTRAVENOUS

## 2017-02-03 MED ORDER — CEFAZOLIN SODIUM-DEXTROSE 2-4 GM/100ML-% IV SOLN
2.0000 g | INTRAVENOUS | Status: AC
  Administered 2017-02-03: 2 g via INTRAVENOUS

## 2017-02-03 MED ORDER — ACETAMINOPHEN 500 MG PO TABS
ORAL_TABLET | ORAL | Status: AC
  Administered 2017-02-03: 1000 mg via ORAL
  Filled 2017-02-03: qty 2

## 2017-02-03 MED ORDER — FENTANYL CITRATE (PF) 100 MCG/2ML IJ SOLN
INTRAMUSCULAR | Status: DC | PRN
  Administered 2017-02-03 (×2): 100 ug via INTRAVENOUS

## 2017-02-03 MED ORDER — ACETAMINOPHEN 325 MG PO TABS: 650.0000 mg | ORAL_TABLET | ORAL | Status: DC | PRN

## 2017-02-03 MED ORDER — SUGAMMADEX SODIUM 200 MG/2ML IV SOLN
INTRAVENOUS | Status: DC | PRN
  Administered 2017-02-03: 180 mg via INTRAVENOUS

## 2017-02-03 MED ORDER — MIDAZOLAM HCL 5 MG/5ML IJ SOLN
INTRAMUSCULAR | Status: DC | PRN
  Administered 2017-02-03: 2 mg via INTRAVENOUS

## 2017-02-03 MED ORDER — PROPOFOL 10 MG/ML IV BOLUS
INTRAVENOUS | Status: DC | PRN
  Administered 2017-02-03: 130 mg via INTRAVENOUS

## 2017-02-03 MED ORDER — HYDROCODONE-ACETAMINOPHEN 5-325 MG PO TABS: 1.0000 | ORAL_TABLET | Freq: Four times a day (QID) | ORAL | 0 refills | Status: DC | PRN

## 2017-02-03 MED ORDER — SODIUM CHLORIDE 0.9 % IV SOLN: 250.0000 mL | INTRAVENOUS | Status: DC | PRN

## 2017-02-03 MED ORDER — FENTANYL CITRATE (PF) 100 MCG/2ML IJ SOLN
INTRAMUSCULAR | Status: AC
  Filled 2017-02-03: qty 2

## 2017-02-03 MED ORDER — SODIUM CHLORIDE 0.9% FLUSH: 3.0000 mL | Freq: Two times a day (BID) | INTRAVENOUS | Status: DC

## 2017-02-03 MED ORDER — ROCURONIUM BROMIDE 100 MG/10ML IV SOLN
INTRAVENOUS | Status: DC | PRN
  Administered 2017-02-03: 50 mg via INTRAVENOUS

## 2017-02-03 MED ORDER — LIDOCAINE HCL (CARDIAC) 20 MG/ML IV SOLN
INTRAVENOUS | Status: DC | PRN
  Administered 2017-02-03: 60 mg via INTRAVENOUS

## 2017-02-03 MED ORDER — MIDAZOLAM HCL 2 MG/2ML IJ SOLN
INTRAMUSCULAR | Status: AC
  Filled 2017-02-03: qty 2

## 2017-02-03 SURGICAL SUPPLY — 44 items
BLADE CLIPPER SURG (BLADE) IMPLANT
CANISTER SUCT 3000ML PPV (MISCELLANEOUS) ×3 IMPLANT
CHLORAPREP W/TINT 26ML (MISCELLANEOUS) ×3 IMPLANT
COVER MAYO STAND STRL (DRAPES) IMPLANT
COVER SURGICAL LIGHT HANDLE (MISCELLANEOUS) ×3 IMPLANT
DERMABOND ADVANCED (GAUZE/BANDAGES/DRESSINGS) ×2
DERMABOND ADVANCED .7 DNX12 (GAUZE/BANDAGES/DRESSINGS) ×1 IMPLANT
DRAPE LAPAROSCOPIC ABDOMINAL (DRAPES) ×3 IMPLANT
DRAPE UTILITY XL STRL (DRAPES) ×6 IMPLANT
DRAPE WARM FLUID 44X44 (DRAPE) ×3 IMPLANT
ELECT BLADE 6.5 EXT (BLADE) IMPLANT
ELECT CAUTERY BLADE 6.4 (BLADE) ×3 IMPLANT
ELECT REM PT RETURN 9FT ADLT (ELECTROSURGICAL) ×3
ELECTRODE REM PT RTRN 9FT ADLT (ELECTROSURGICAL) ×1 IMPLANT
GLOVE EUDERMIC 7 POWDERFREE (GLOVE) ×3 IMPLANT
GOWN STRL REUS W/ TWL LRG LVL3 (GOWN DISPOSABLE) ×2 IMPLANT
GOWN STRL REUS W/ TWL XL LVL3 (GOWN DISPOSABLE) ×1 IMPLANT
GOWN STRL REUS W/TWL LRG LVL3 (GOWN DISPOSABLE) ×4
GOWN STRL REUS W/TWL XL LVL3 (GOWN DISPOSABLE) ×2
KIT BASIN OR (CUSTOM PROCEDURE TRAY) ×3 IMPLANT
KIT ROOM TURNOVER OR (KITS) ×3 IMPLANT
LIGASURE IMPACT 36 18CM CVD LR (INSTRUMENTS) IMPLANT
NS IRRIG 1000ML POUR BTL (IV SOLUTION) ×6 IMPLANT
PACK GENERAL/GYN (CUSTOM PROCEDURE TRAY) ×3 IMPLANT
PAD ARMBOARD 7.5X6 YLW CONV (MISCELLANEOUS) ×3 IMPLANT
PAD SHARPS MAGNETIC DISPOSAL (MISCELLANEOUS) IMPLANT
SPECIMEN JAR X LARGE (MISCELLANEOUS) IMPLANT
SPONGE LAP 18X18 X RAY DECT (DISPOSABLE) IMPLANT
STAPLER VISISTAT 35W (STAPLE) ×3 IMPLANT
SUCTION POOLE TIP (SUCTIONS) ×3 IMPLANT
SUT MNCRL AB 4-0 PS2 18 (SUTURE) ×3 IMPLANT
SUT PDS AB 1 TP1 96 (SUTURE) ×6 IMPLANT
SUT SILK 2 0 SH CR/8 (SUTURE) ×3 IMPLANT
SUT SILK 2 0 TIES 10X30 (SUTURE) ×3 IMPLANT
SUT SILK 3 0 SH CR/8 (SUTURE) ×3 IMPLANT
SUT SILK 3 0 TIES 10X30 (SUTURE) ×3 IMPLANT
SUT VIC AB 3-0 SH 8-18 (SUTURE) ×3 IMPLANT
SWAB CULTURE ESWAB REG 1ML (MISCELLANEOUS) ×3 IMPLANT
SWAB CULTURE LIQUID MINI MALE (MISCELLANEOUS) ×3 IMPLANT
SYR BULB IRRIGATION 50ML (SYRINGE) IMPLANT
TOWEL OR 17X26 10 PK STRL BLUE (TOWEL DISPOSABLE) ×3 IMPLANT
TRAY FOLEY W/METER SILVER 14FR (SET/KITS/TRAYS/PACK) IMPLANT
WATER STERILE IRR 1000ML POUR (IV SOLUTION) IMPLANT
YANKAUER SUCT BULB TIP NO VENT (SUCTIONS) IMPLANT

## 2017-02-03 NOTE — Op Note (Addendum)
Patient Name:           Jeffrey [name]   Date of Surgery:        02/03/2017  Pre op Diagnosis:      Incisional pain, abdominal wall trocar site, suspect neuroma  Post op Diagnosis:    Same  Procedure:                 Abdominal wall wound exploration, debridement scar tissue, inject 98% alcohol into the fascia and rectus sheath  1.  Progress note or procedure note with a detailed description of the procedure.  2.  Tool used for debridement (curette, scapel, etc.)  Scalpel and cautery  3.  Frequency of surgical debridement.   initial  4.  Measurement of total devitalized tissue (wound surface) before and after surgical debridement.   1 cm X 1 cm  5.  Area and depth of devitalized tissue removed from wound.  2 cm deep, 1 cm X 1 cm area  6.  Blood loss and description of tissue removed.  Minimal blood loss; fascia removed  7.  Evidence of the progress of the wound's response to treatment.  A.  Current wound volume (current dimensions and depth).  Initial   B.  Presence (and extent of) of infection.  none  C.  Presence (and extent of) of non viable tissue.  none  D.  Other material in the wound that is expected to inhibit healing.  *sutures 8.  Was there any viable tissue removed (measurements): 1 X 1 cm.      Surgeon:                     Edsel Petrin. Dalbert Batman, M.D., FACS  Assistant:                      None   Indication for Assistant: n/a  Operative Indications:    This is a very pleasant 66 year old man who returns to discuss management of his left lower quadrant abdominal wall pain. Dr. Mayford Knife referred him to me. Dr. Wende Neighbors is his PCP. Dr. Arnoldo Morale is his spine surgeon..    Past history significant for umbilical hernia repair by Dr. Rise Patience many years ago. 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 He had delayed wound healing. On October 22, 2012 I performed  laparoscopic repair of ventral incisional hernia with mesh. I described a small defect, incarcerated omentum which was taken down. Repaired with a 20 cm x 15 cm of parietex composite mesh. Double crown technique a Pro-Tex and then 8 equidistant suture fixation sites using #1 Novafil.     He's had some LLQ neuropathic pain for 2 years. Has been getting much more frequent lately and in inhibiting him from working out in the gym. I examined him twice now and is 1 particular point which is a little bit tender. He describes the pain as burning and tingling.  He recently told me this comes and goes.     We performed a CT scan of abdomen and pelvis. There was no specific abnormality to explain his pain. Certainly no recurrent hernia mass or fluid collection. No inflammatory changes in the abdomen.     I told him that it is possible he has a neuroma or entrapped nerve at the suture fixation site. We talked about a lot of options. We talked about  doing nothing but he says it's too much of a problem for him. We talked about referral to pain clinic for injection therapy and he does not want to do that. We talked about trying gabapentin but he does not want to start any new medications. We talked about surgery and I described cutting down on this point in the abdominal wall taking the suture possibly injecting the nerves with alcohol or ligation. I told him there was a 50% or greater chance that this would resolve his symptoms but certainly could not guarantee it. If the pain persists he'll need to see a pain clinic. He was happy with that. I discussed the indications, details, techniques, and numerous risk of the surgery with him.  He agrees with this plan.  Operative Findings:       Preoperatively he was examined awake and pointed to a suture fixation site in the left lower quadrant] see the small scar where the suture was placed.  We marked this site.  This was the only tender suture fixation site  that he had.  In the operating room I made about a 3 cm incision so that I can get retractors in.  There was no abnormality of the subcutaneous fat.  There was a little bit of thickening and scar tissue in one area of the anterior rectus sheath but I did not see a suture despite some dissection in this area.  There is no sign of infection.  I explored an area of the anterior rectus fascia over 3 cm in diameter and found nothing else.  I injected 98% alcohol into the anterior rectus sheath and somewhat deep to this very carefully.  Total of about 4 mL.  Procedure in Detail:          Following the induction of general endotracheal anesthesia the patient's abdomen was prepped and draped in a sterile fashion.  Surgical timeout was performed.  Intravenous antibodies were given.  The suture fixation site scar was incised and extended for a length of a almost 3 cm.  Dissection was carried down through the subcutaneous tissue exposing the anterior rectus sheath.  This area was thoroughly explored and palpated with findings as described above.  I debrided some scar tissue.  I injected  absolute alcohol.  There was no bleeding.  The subcutaneous tissue was closed with Vicryl sutures and the skin closed running subcuticular 4-0 Monocryl and Dermabond.  The patient tolerated the procedure well was taken to PACU in stable condition.  EBL less than 10 mL.  Counts correct.  Complications none.     Edsel Petrin. Dalbert Batman, M.D., FACS General and Minimally Invasive Surgery Breast and Colorectal Surgery   Addendum: I logged on to the  [facility]  website and reviewed his prescription medication history   02/03/2017 3:21 PM

## 2017-02-03 NOTE — Anesthesia Postprocedure Evaluation (Signed)
Anesthesia Post Note  Patient: Jeffrey [name]  Procedure(s) Performed: Procedure(s) (LRB): EXPLORE ABDOMINAL WOUND, INJECT ABSOLUTE ALCOHOL (N/A)  Patient location during evaluation: PACU Anesthesia Type: General Level of consciousness: awake and alert Pain management: pain level controlled Vital Signs Assessment: post-procedure vital signs reviewed and stable Respiratory status: spontaneous breathing, nonlabored ventilation and respiratory function stable Cardiovascular status: blood pressure returned to baseline and stable Postop Assessment: no signs of nausea or vomiting Anesthetic complications: no       Last Vitals:  Vitals:   02/03/17 1617 02/03/17 1630  BP: 128/79   Pulse:  78  Resp:  17  Temp:      Last Pain:  Vitals:   02/03/17 1630  TempSrc:   PainSc: 5                  Delle Andrzejewski,W. EDMOND

## 2017-02-03 NOTE — Anesthesia Procedure Notes (Signed)
Procedure Name: Intubation Date/Time: 02/03/2017 2:38 PM Performed by: Lance Coon Pre-anesthesia Checklist: Patient identified, Emergency Drugs available, Suction available, Patient being monitored and Timeout performed Patient Re-evaluated:Patient Re-evaluated prior to inductionOxygen Delivery Method: Circle system utilized Preoxygenation: Pre-oxygenation with 100% oxygen Intubation Type: IV induction Ventilation: Mask ventilation without difficulty Laryngoscope Size: Miller and 3 Grade View: Grade I Tube type: Oral Tube size: 7.5 mm Number of attempts: 1 Airway Equipment and Method: Stylet Placement Confirmation: ETT inserted through vocal cords under direct vision,  positive ETCO2 and breath sounds checked- equal and bilateral Secured at: 22 cm Tube secured with: Tape Dental Injury: Teeth and Oropharynx as per pre-operative assessment

## 2017-02-03 NOTE — Anesthesia Preprocedure Evaluation (Addendum)
Anesthesia Evaluation  Patient identified by MRN, date of birth, ID band Patient awake    Reviewed: Allergy & Precautions, H&P , NPO status , Patient's Chart, lab work & pertinent test results  Airway Mallampati: II  TM Distance: >3 FB Neck ROM: Full    Dental no notable dental hx. (+) Teeth Intact, Dental Advisory Given   Pulmonary neg pulmonary ROS,    Pulmonary exam normal breath sounds clear to auscultation       Cardiovascular negative cardio ROS   Rhythm:Regular Rate:Normal     Neuro/Psych negative neurological ROS  negative psych ROS   GI/Hepatic Neg liver ROS, GERD  Medicated and Controlled,  Endo/Other  negative endocrine ROS  Renal/GU negative Renal ROS  negative genitourinary   Musculoskeletal   Abdominal   Peds  Hematology negative hematology ROS (+)   Anesthesia Other Findings   Reproductive/Obstetrics negative OB ROS                            Anesthesia Physical Anesthesia Plan  ASA: II  Anesthesia Plan: General   Post-op Pain Management:    Induction: Intravenous  Airway Management Planned: Oral ETT  Additional Equipment:   Intra-op Plan:   Post-operative Plan: Extubation in OR  Informed Consent: I have reviewed the patients History and Physical, chart, labs and discussed the procedure including the risks, benefits and alternatives for the proposed anesthesia with the patient or authorized representative who has indicated his/her understanding and acceptance.   Dental advisory given  Plan Discussed with: CRNA  Anesthesia Plan Comments:         Anesthesia Quick Evaluation

## 2017-02-03 NOTE — Transfer of Care (Signed)
Immediate Anesthesia Transfer of Care Note  Patient: Jeffrey [name]  Procedure(s) Performed: Procedure(s): EXPLORE ABDOMINAL WOUND, INJECT ABSOLUTE ALCOHOL (N/A)  Patient Location: PACU  Anesthesia Type:General  Level of Consciousness: awake, alert , oriented and patient cooperative  Airway & Oxygen Therapy: Patient Spontanous Breathing  Post-op Assessment: Report given to RN and Post -op Vital signs reviewed and stable  Post vital signs: Reviewed and stable  Last Vitals:  Vitals:   02/03/17 1240 02/03/17 1251  BP:  (!) 152/71  Pulse:  64  Resp:  18  Temp: 37 C     Last Pain:  Vitals:   02/03/17 1240  TempSrc: Oral  PainSc:       Patients Stated Pain Goal: 3 (09/32/67 1245)  Complications: No apparent anesthesia complications

## 2017-02-03 NOTE — Interval H&P Note (Signed)
History and Physical Interval Note:  02/03/2017 1:40 PM  Jeffrey [name]  has presented today for surgery, with the diagnosis of Incisional pain, neuropathy  The various methods of treatment have been discussed with the patient and family. After consideration of risks, benefits and other options for treatment, the patient has consented to  Procedure(s): EXPLORE ABDOMINAL WOUND, REMOVE STITCHES, INJECT ABSOLUTE ALCOHOL (N/A) as a surgical intervention .  The patient's history has been reviewed, patient examined, no change in status, stable for surgery.  I have reviewed the patient's chart and labs.  Questions were answered to the patient's satisfaction.     Adin Hector

## 2017-02-03 NOTE — Discharge Instructions (Signed)
No sports or heavy lifting for 3 weeks You may drive a car in 2 or 3 days but she may not take narcotic pain medicine and drive a car You may shower starting tomorrow. No tub baths for 3 weeks  Light diet today.  Drink lots of water Regular diet tomorrow  Narcotic pain medicine will be prescribed This may constipate you so take stool softeners or laxatives as needed

## 2017-02-04 ENCOUNTER — Encounter (HOSPITAL_COMMUNITY): Payer: Self-pay | Admitting: General Surgery

## 2017-04-14 DIAGNOSIS — E559 Vitamin D deficiency, unspecified: Secondary | ICD-10-CM | POA: Diagnosis not present

## 2017-04-14 DIAGNOSIS — R7301 Impaired fasting glucose: Secondary | ICD-10-CM | POA: Diagnosis not present

## 2017-04-14 DIAGNOSIS — E782 Mixed hyperlipidemia: Secondary | ICD-10-CM | POA: Diagnosis not present

## 2017-04-16 DIAGNOSIS — E782 Mixed hyperlipidemia: Secondary | ICD-10-CM | POA: Diagnosis not present

## 2017-04-16 DIAGNOSIS — N529 Male erectile dysfunction, unspecified: Secondary | ICD-10-CM | POA: Diagnosis not present

## 2017-04-16 DIAGNOSIS — R5383 Other fatigue: Secondary | ICD-10-CM | POA: Diagnosis not present

## 2017-04-16 DIAGNOSIS — R7301 Impaired fasting glucose: Secondary | ICD-10-CM | POA: Diagnosis not present

## 2017-04-16 DIAGNOSIS — E559 Vitamin D deficiency, unspecified: Secondary | ICD-10-CM | POA: Diagnosis not present

## 2017-05-19 DIAGNOSIS — R030 Elevated blood-pressure reading, without diagnosis of hypertension: Secondary | ICD-10-CM | POA: Diagnosis not present

## 2017-05-19 DIAGNOSIS — Z6831 Body mass index (BMI) 31.0-31.9, adult: Secondary | ICD-10-CM | POA: Diagnosis not present

## 2017-05-19 DIAGNOSIS — M4316 Spondylolisthesis, lumbar region: Secondary | ICD-10-CM | POA: Diagnosis not present

## 2017-07-22 ENCOUNTER — Telehealth: Payer: Self-pay | Admitting: Internal Medicine

## 2017-07-22 MED ORDER — POLYETHYLENE GLYCOL 3350 17 GM/SCOOP PO POWD: 17.0000 g | Freq: Every day | ORAL | 11 refills | Status: DC

## 2017-07-22 NOTE — Telephone Encounter (Signed)
rx has been sent in to the pharmacy.  

## 2017-07-22 NOTE — Telephone Encounter (Signed)
Patient called. Would like to get generic MiraLAX called into [facility].  Let's call in polyethylene glycol 527 grams.  17 g in 8 ounces of water daily. Refill 1 year. Please call into the drugstore.

## 2017-07-30 DIAGNOSIS — Z23 Encounter for immunization: Secondary | ICD-10-CM | POA: Diagnosis not present

## 2017-07-30 DIAGNOSIS — J019 Acute sinusitis, unspecified: Secondary | ICD-10-CM | POA: Diagnosis not present

## 2017-10-29 DIAGNOSIS — H539 Unspecified visual disturbance: Secondary | ICD-10-CM | POA: Diagnosis not present

## 2017-10-29 DIAGNOSIS — N329 Bladder disorder, unspecified: Secondary | ICD-10-CM | POA: Diagnosis not present

## 2017-10-29 DIAGNOSIS — R7301 Impaired fasting glucose: Secondary | ICD-10-CM | POA: Diagnosis not present

## 2017-10-29 DIAGNOSIS — Z6830 Body mass index (BMI) 30.0-30.9, adult: Secondary | ICD-10-CM | POA: Diagnosis not present

## 2017-10-29 DIAGNOSIS — Z1331 Encounter for screening for depression: Secondary | ICD-10-CM | POA: Diagnosis not present

## 2017-10-29 DIAGNOSIS — K219 Gastro-esophageal reflux disease without esophagitis: Secondary | ICD-10-CM | POA: Diagnosis not present

## 2017-10-29 DIAGNOSIS — Z713 Dietary counseling and surveillance: Secondary | ICD-10-CM | POA: Diagnosis not present

## 2017-10-29 DIAGNOSIS — E782 Mixed hyperlipidemia: Secondary | ICD-10-CM | POA: Diagnosis not present

## 2017-12-08 DIAGNOSIS — H6692 Otitis media, unspecified, left ear: Secondary | ICD-10-CM | POA: Diagnosis not present

## 2017-12-08 DIAGNOSIS — Z681 Body mass index (BMI) 19 or less, adult: Secondary | ICD-10-CM | POA: Diagnosis not present

## 2018-04-23 DIAGNOSIS — Z6830 Body mass index (BMI) 30.0-30.9, adult: Secondary | ICD-10-CM | POA: Diagnosis not present

## 2018-04-23 DIAGNOSIS — M4800 Spinal stenosis, site unspecified: Secondary | ICD-10-CM | POA: Diagnosis not present

## 2018-04-23 DIAGNOSIS — R7301 Impaired fasting glucose: Secondary | ICD-10-CM | POA: Diagnosis not present

## 2018-04-23 DIAGNOSIS — K219 Gastro-esophageal reflux disease without esophagitis: Secondary | ICD-10-CM | POA: Diagnosis not present

## 2018-04-23 DIAGNOSIS — N529 Male erectile dysfunction, unspecified: Secondary | ICD-10-CM | POA: Diagnosis not present

## 2018-04-23 DIAGNOSIS — E782 Mixed hyperlipidemia: Secondary | ICD-10-CM | POA: Diagnosis not present

## 2018-04-23 DIAGNOSIS — Z681 Body mass index (BMI) 19 or less, adult: Secondary | ICD-10-CM | POA: Diagnosis not present

## 2018-04-23 DIAGNOSIS — H6692 Otitis media, unspecified, left ear: Secondary | ICD-10-CM | POA: Diagnosis not present

## 2018-04-23 DIAGNOSIS — H539 Unspecified visual disturbance: Secondary | ICD-10-CM | POA: Diagnosis not present

## 2018-04-23 DIAGNOSIS — Z1331 Encounter for screening for depression: Secondary | ICD-10-CM | POA: Diagnosis not present

## 2018-04-23 DIAGNOSIS — Z713 Dietary counseling and surveillance: Secondary | ICD-10-CM | POA: Diagnosis not present

## 2018-04-23 DIAGNOSIS — Z0000 Encounter for general adult medical examination without abnormal findings: Secondary | ICD-10-CM | POA: Diagnosis not present

## 2018-04-29 DIAGNOSIS — K219 Gastro-esophageal reflux disease without esophagitis: Secondary | ICD-10-CM | POA: Diagnosis not present

## 2018-04-29 DIAGNOSIS — Z6828 Body mass index (BMI) 28.0-28.9, adult: Secondary | ICD-10-CM | POA: Diagnosis not present

## 2018-04-29 DIAGNOSIS — R7301 Impaired fasting glucose: Secondary | ICD-10-CM | POA: Diagnosis not present

## 2018-05-25 DIAGNOSIS — R030 Elevated blood-pressure reading, without diagnosis of hypertension: Secondary | ICD-10-CM | POA: Diagnosis not present

## 2018-05-25 DIAGNOSIS — M48062 Spinal stenosis, lumbar region with neurogenic claudication: Secondary | ICD-10-CM | POA: Diagnosis not present

## 2018-05-25 DIAGNOSIS — M4316 Spondylolisthesis, lumbar region: Secondary | ICD-10-CM | POA: Diagnosis not present

## 2018-07-02 ENCOUNTER — Other Ambulatory Visit: Payer: Self-pay

## 2018-07-02 MED ORDER — DEXLANSOPRAZOLE 60 MG PO CPDR: 60.0000 mg | DELAYED_RELEASE_CAPSULE | Freq: Every day | ORAL | 11 refills | Status: DC

## 2018-07-29 ENCOUNTER — Encounter: Payer: Self-pay | Admitting: Internal Medicine

## 2018-08-30 ENCOUNTER — Telehealth: Payer: Self-pay

## 2018-08-30 NOTE — Telephone Encounter (Signed)
PATIENT COMING 11/26 FOR A NURSE VISIT

## 2018-08-30 NOTE — Telephone Encounter (Signed)
Rourk, Cristopher Estimable, MD  Derrick Ravel, [place]        Patient called me about timing of next colonoscopy. He will be due by March of next year. We need to go ahead and plan to set him up to doing in January. Could somebody please triage him and get him on the schedule between now on 1 December -that is call him during this time to set him up for January. Thanks.     Almyra Free see note above from RMR. Routing Message.

## 2018-08-30 NOTE — Telephone Encounter (Signed)
Please schedule nurse visit

## 2018-09-14 ENCOUNTER — Ambulatory Visit (INDEPENDENT_AMBULATORY_CARE_PROVIDER_SITE_OTHER): Payer: Self-pay

## 2018-09-14 DIAGNOSIS — Z86010 Personal history of colonic polyps: Secondary | ICD-10-CM

## 2018-09-14 NOTE — Progress Notes (Signed)
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 Diabetes Melitis: no 2. Joint replacements in the past 12 months: no 3. Major health problems in the past 3 months: no 4. Has an artificial valve or MVP: no 5. Has a defibrillator: no 6. Has been advised in past to take antibiotics in advance of a procedure like teeth cleaning: yes ( for dentist) 7. Family history of colon cancer: no  8. Alcohol Use: no 9. History of sleep apnea: no  10. History of coronary artery or other vascular stents placed within the last 12 months: no 11. History of any prior anesthesia complications: no    MEDICATIONS & ALLERGIES:    Patient reports the following regarding taking any blood thinners:   Plavix? no Aspirin? yes (81mg ) Coumadin? no Brilinta? no Xarelto? no Eliquis? no Pradaxa? no Savaysa? no Effient? no  Patient confirms/reports the following medications:  Current Outpatient Medications  Medication Sig Dispense Refill  . aspirin EC 81 MG tablet Take 81 mg by mouth at bedtime.    Marland Kitchen esomeprazole (NEXIUM) 20 MG capsule Take 20 mg by mouth daily at 12 noon.    . fluticasone (FLONASE) 50 MCG/ACT nasal spray Place 1 spray into both nostrils daily as needed for allergies. Allergies     . Olopatadine HCl (PATADAY) 0.2 % SOLN Place 1 drop into both ears daily as needed (eye irritation).     . polyethylene glycol powder (GLYCOLAX/MIRALAX) powder Take 17 g by mouth daily. In 8oz of water 578 g 11  . rosuvastatin (CRESTOR) 10 MG tablet Take 10 mg by mouth at bedtime.     . senna (SENOKOT) 8.6 MG tablet Take 1 tablet by mouth daily as needed for constipation.      No current facility-administered medications for this visit.     Patient confirms/reports the following allergies:  Allergies   Allergen Reactions  . No Known Allergies     No orders of the defined types were placed in this encounter.   AUTHORIZATION INFORMATION Primary Insurance: medicare,  ID #: [id number] Pre-Cert / Auth required:no  SCHEDULE INFORMATION: Procedure has been scheduled as follows:  Date: 11/24/18, Time: 7:30  Location: [facility] Dr.Rourk  This Gastroenterology Pre-Precedure Review Form is being routed to the following provider(s): Neil Crouch PA

## 2018-09-14 NOTE — Patient Instructions (Addendum)
SPLIT-DOSING PLENVU INSTRUCTION SHEET  Please notify us immediately if you are diabetic, take iron supplements, or if you are on coumadin or any blood thinners.  Patient Name:  Jeffrey [name] Date of procedure:  11/24/18 Time to register at [place] Stay:  6:30am Provider:  Dr. Gala Romney  11/23/18  1 Day prior to procedure:      CLEAR LIQUIDS ALL DAY--NO SOLID FOODS OR DAIRY PRODUCTS!   See list of liquids that are allowed and items that are NOT allowed below.   MAKE SURE YOU DRINK AT LEAST 64 OUNCES OF CLEAR LIQUIDS PRIOR TO STARTING YOUR PREP AND HYDRATE THROUGHOUT YOUR BOWEL PREP!   DO NOT take any oral medications within 1 hour of starting each dose of PLENVU  You can premix your dose of Plenvu and store in refrigerator to get cold BUT you must use within 6 hours!   At 5:00 PM Begin the prep as follows:    1. Empty contents of Dose 1 into the mixing container that comes with PLENVU.  2. Add water to the fill line on the mixing container (at least 16 fluid ounces). Do not add other ingredients to the solution.  3. Thoroughly mix with a spoon or shake with lid on securely until completely dissolved (which may take 2 to 3 minutes).  4. Drink over the next 30 minutes. Be sure to drink all of the solution.  5. Refill the mixing container to the fill line (at least 16 fluid ounces) with clear liquids and drink over the next 30 minutes.   6. Consume additional clear liquids during the evening.  CONTINUE CLEAR LIQUIDS OVERNIGHT. You may take heart, blood pressure or breathing medications.   11/24/18  Day of Procedure   DO NOT take any oral medications within 1 hour of starting each dose of PLENVU.    5 hours before procedure @ 2:30am: Drink second dose of Plenvu. 1. Empty the contents of Dose 2 Pouch A and Dose 2 Pouch B into the mixing container that comes with PLENVU.  2. Add water to the fill line on the mixing container (at least 16 fluid ounces).  Do not add other ingredients  to the solution. 3. Thoroughly mixed with a spoon or shake with lid on securely until completely dissolved (which may take 2-3 minutes).  Drink over the next 30 minutes.  Be sure to drink all of the solution. 4. Refill the mixing container to the fill line (at least 16 fluid ounces) with clear liquids and drink over the next 30 minutes. 5. Consume additional water or clear liquids up to 3 hours (4:30am) before the colonoscopy.  You may take TYLENOL products.  Please continue your regular medications unless we have instructed you otherwise.    Please note, on the day of your procedure you MUST be accompanied by an adult who is willing to assume responsibility for you at time of discharge. If you do not have such person with you, your procedure will have to be rescheduled.  Please leave ALL jewelry at home prior to coming to the hospital for your procedure.   *It is your responsibility to check with your insurance company for the benefits of coverage you have for this procedure. Unfortunately, not all insurance companies have benefits to cover all or part of these types of procedures. It is your responsibility to check your benefits, however we will be glad to assist you with any codes your insurance company may need.   Please note that most insurance companies will not cover a screening colonoscopy for people under the age of 39  For example, with some insurance companies you may have benefits for a screening colonoscopy, but if polyps are found the diagnosis will change and then you may have a deductible that will need to be met. Please make sure you check your benefits for screening colonoscopy as well as a diagnostic colonoscopy.  CLEAR LIQUIDS: (NO RED) Jello Apple Juice  White Grape Juice Water Banana popsicles  Kool-Aid  Coffee(No cream or milk) Tea (No cream or milk) Soft  drinks Broth (fat free beef/chicken/vegetable)  Clear liquids allow you to see your fingers on the other side of the glass.  Be sure they are NOT RED in color, cloudy, but CLEAR.  Do Not Eat: Dairy products of any kind Cranberry juice Tomato or V8 Juice  Orange Juice Grapefruit Juice  Red Grape Juice Solid foods like cereal, oatmeal, yogurt, fruits, vegetables, creamed soups, eggs, bread, etc   HELPFUL HINTS TO MAKE DRINKING EASIER: -Make sure prep is extremely COLD. You may drink over ice. -Trying drinking through a straw. -Rinse mouth with water or mouthwash between glasses to remove aftertaste. -Try sipping on a cold beverage/ice popsicles between glasses of prep. -Place a piece of sugar-free hard candy in mouth between glasses. -If you become nauseated, try consuming smaller amounts or stretch out the time between glasses.  Stop for 30 minutes to an hour & slowly start back drinking.  Call our office with any questions or concerns at [phone].  Thank You

## 2018-09-21 DIAGNOSIS — Z0000 Encounter for general adult medical examination without abnormal findings: Secondary | ICD-10-CM | POA: Diagnosis not present

## 2018-09-21 DIAGNOSIS — J060 Acute laryngopharyngitis: Secondary | ICD-10-CM | POA: Diagnosis not present

## 2018-09-23 NOTE — Progress Notes (Signed)
Ok to schedule.

## 2018-09-28 DIAGNOSIS — Z125 Encounter for screening for malignant neoplasm of prostate: Secondary | ICD-10-CM | POA: Diagnosis not present

## 2018-09-28 DIAGNOSIS — R310 Gross hematuria: Secondary | ICD-10-CM | POA: Diagnosis not present

## 2018-09-28 DIAGNOSIS — N400 Enlarged prostate without lower urinary tract symptoms: Secondary | ICD-10-CM | POA: Diagnosis not present

## 2018-10-25 ENCOUNTER — Telehealth: Payer: Self-pay

## 2018-10-25 NOTE — Telephone Encounter (Signed)
We had a cancellation for 11/03/18. I called the pt and offered him this spot. He accepted the appt. We went over his instructions and he changed the dates and times on his instructions and stated he understood when to arrive and when to do his prep and when to stop drinking clear liquids. I called Hoyle Sauer and changed the date and time and also changed him on our schedule.  Routing to RMR for FYI.

## 2018-10-25 NOTE — Telephone Encounter (Signed)
thanks

## 2018-11-02 ENCOUNTER — Telehealth: Payer: Self-pay | Admitting: Internal Medicine

## 2018-11-02 NOTE — Telephone Encounter (Signed)
Spoke with the pt and answered his questions.

## 2018-11-02 NOTE — Telephone Encounter (Signed)
Pt is scheduled with RMR tomorrow and has questions about his prep. Please call him at [phone]

## 2018-11-03 ENCOUNTER — Other Ambulatory Visit: Payer: Self-pay

## 2018-11-03 ENCOUNTER — Ambulatory Visit (HOSPITAL_COMMUNITY)
Admission: RE | Admit: 2018-11-03 | Discharge: 2018-11-03 | Disposition: A | Discharge status: Home / Self Care | Payer: Medicare Other | Attending: Internal Medicine | Admitting: Internal Medicine

## 2018-11-03 ENCOUNTER — Encounter (HOSPITAL_COMMUNITY)
Admission: RE | Disposition: A | Discharge status: Home / Self Care | Payer: Self-pay | Source: Home / Self Care | Attending: Internal Medicine

## 2018-11-03 ENCOUNTER — Encounter (HOSPITAL_COMMUNITY): Payer: Self-pay | Admitting: *Deleted

## 2018-11-03 DIAGNOSIS — E7800 Pure hypercholesterolemia, unspecified: Secondary | ICD-10-CM | POA: Diagnosis not present

## 2018-11-03 DIAGNOSIS — K640 First degree hemorrhoids: Secondary | ICD-10-CM | POA: Diagnosis not present

## 2018-11-03 DIAGNOSIS — K6289 Other specified diseases of anus and rectum: Secondary | ICD-10-CM | POA: Diagnosis not present

## 2018-11-03 DIAGNOSIS — K219 Gastro-esophageal reflux disease without esophagitis: Secondary | ICD-10-CM | POA: Insufficient documentation

## 2018-11-03 DIAGNOSIS — Z7982 Long term (current) use of aspirin: Secondary | ICD-10-CM | POA: Insufficient documentation

## 2018-11-03 DIAGNOSIS — Z85828 Personal history of other malignant neoplasm of skin: Secondary | ICD-10-CM | POA: Insufficient documentation

## 2018-11-03 DIAGNOSIS — Z79899 Other long term (current) drug therapy: Secondary | ICD-10-CM | POA: Insufficient documentation

## 2018-11-03 DIAGNOSIS — Z86010 Personal history of colonic polyps: Secondary | ICD-10-CM | POA: Insufficient documentation

## 2018-11-03 DIAGNOSIS — K921 Melena: Secondary | ICD-10-CM | POA: Diagnosis not present

## 2018-11-03 HISTORY — PX: COLONOSCOPY: SHX5424

## 2018-11-03 SURGERY — COLONOSCOPY
Anesthesia: Moderate Sedation

## 2018-11-03 MED ORDER — MIDAZOLAM HCL 5 MG/5ML IJ SOLN
INTRAMUSCULAR | Status: DC | PRN
  Administered 2018-11-03 (×2): 2 mg via INTRAVENOUS
  Administered 2018-11-03 (×2): 1 mg via INTRAVENOUS

## 2018-11-03 MED ORDER — SODIUM CHLORIDE 0.9 % IV SOLN
INTRAVENOUS | Status: DC
  Administered 2018-11-03: 08:00:00 via INTRAVENOUS

## 2018-11-03 MED ORDER — MEPERIDINE HCL 100 MG/ML IJ SOLN
INTRAMUSCULAR | Status: DC | PRN
  Administered 2018-11-03: 15 mg via INTRAVENOUS
  Administered 2018-11-03: 25 mg via INTRAVENOUS

## 2018-11-03 MED ORDER — MIDAZOLAM HCL 5 MG/5ML IJ SOLN
INTRAMUSCULAR | Status: AC
  Filled 2018-11-03: qty 10

## 2018-11-03 MED ORDER — STERILE WATER FOR IRRIGATION IR SOLN
Status: DC | PRN
  Administered 2018-11-03: 1.5 mL

## 2018-11-03 MED ORDER — ONDANSETRON HCL 4 MG/2ML IJ SOLN
INTRAMUSCULAR | Status: DC | PRN
  Administered 2018-11-03: 4 mg via INTRAVENOUS

## 2018-11-03 MED ORDER — MEPERIDINE HCL 50 MG/ML IJ SOLN
INTRAMUSCULAR | Status: AC
  Filled 2018-11-03: qty 1

## 2018-11-03 MED ORDER — ONDANSETRON HCL 4 MG/2ML IJ SOLN
INTRAMUSCULAR | Status: AC
  Filled 2018-11-03: qty 2

## 2018-11-03 NOTE — Op Note (Signed)
[facility] Patient Name: Jeffrey [name] Procedure Date: 11/03/2018 8:08 AM MRN: [id number] Date of Birth: 1951-01-05 Attending MD: Norvel Richards , MD CSN: [id number] Age: 68 Admit Type: Ambulatory Procedure:                Colonoscopy Indications:              Hematochezia Providers:                Norvel Richards, MD, Charlsie Quest. Theda Sers RN, RN,                            Lurline Del, RN Referring MD:              Medicines:                Midazolam 6 mg IV, Meperidine 40 mg IV, Ondansetron                            4 mg IV Complications:            No immediate complications. Estimated Blood Loss:     Estimated blood loss: none. Procedure:                Pre-Anesthesia Assessment:                           - Prior to the procedure, a History and Physical                            was performed, and patient medications and                            allergies were reviewed. The patient's tolerance of                            previous anesthesia was also reviewed. The risks                            and benefits of the procedure and the sedation                            options and risks were discussed with the patient.                            All questions were answered, and informed consent                            was obtained. Prior Anticoagulants: The patient has                            taken no previous anticoagulant or antiplatelet                            agents. ASA Grade Assessment: II - A patient with  mild systemic disease. After reviewing the risks                            and benefits, the patient was deemed in                            satisfactory condition to undergo the procedure.                           After obtaining informed consent, the colonoscope                            was passed under direct vision. Throughout the                            procedure, the patient's blood pressure, pulse, and                            oxygen saturations were monitored continuously. The                            CF-[id number] ([id number]) scope was introduced through                            the anus and advanced to the the cecum, identified                            by appendiceal orifice and ileocecal valve. The                            colonoscopy was performed without difficulty. The                            patient tolerated the procedure well. The quality                            of the bowel preparation was adequate. The                            ileocecal valve, appendiceal orifice, and rectum                            were photographed. The entire colon was well                            visualized. Scope In: 8:26:43 AM Scope Out: 8:43:54 AM Scope Withdrawal Time: 0 hours 11 minutes 30 seconds  Total Procedure Duration: 0 hours 17 minutes 11 seconds  Findings:      The perianal exam was abnormal. Single hard/firm pea-sized nodule       somewhat deeper at the 9 o'clock position at the anorectal junction.       Overlying skin appeared normal. Not consistent with a anal papilla or       thrombosed hemorrhoid as far surface appearance concerned.  The colon (entire examined portion) appeared normal.      Non-bleeding internal hemorrhoids were found during retroflexion. The       hemorrhoids were moderate, medium-sized and Grade I (internal       hemorrhoids that do not prolapse).      The exam was otherwise without abnormality on direct and retroflexion       views. Impression:               - Abnormal perianal exam.                           - The entire examined colon is normal.                           - Non-bleeding internal hemorrhoids.                           - The examination was otherwise normal on direct                            and retroflexion views.                           - No specimens collected. I suspect bleeding from                             hemorrhoids. Of low platelet count of uncertain                            significance. Moderate Sedation:      Moderate (conscious) sedation was administered by the endoscopy nurse       and supervised by the endoscopist. The following parameters were       monitored: oxygen saturation, heart rate, blood pressure, respiratory       rate, EKG, adequacy of pulmonary ventilation, and response to care.       Total physician intraservice time was 21 minutes. Recommendation:           - Patient has a contact number available for                            emergencies. The signs and symptoms of potential                            delayed complications were discussed with the                            patient. Return to normal activities tomorrow.                            Written discharge instructions were provided to the                            patient.                           - Advance diet as tolerated.                           -  Continue present medications.                           - Repeat colonoscopy in 5 years for screening                            purposes. If anal nodule continues to bother                            patient, would recommend seeing Dr. Dalbert Batman for                            resection. I recommended patient ask Dr. Nevada Crane about                            his low platelet count at his next visit.                           - Return to GI office (date not yet determined). Procedure Code(s):        --- Professional ---                           [phone], Colonoscopy, flexible; diagnostic, including                            collection of specimen(s) by brushing or washing,                            when performed (separate procedure)                           G0500, Moderate sedation services provided by the                            same physician or other qualified health care                            professional performing a gastrointestinal                             endoscopic service that sedation supports,                            requiring the presence of an independent trained                            observer to assist in the monitoring of the                            patient's level of consciousness and physiological                            status; initial 15 minutes of intra-service time;  patient age 45 years or older (additional time may                            be reported with [phone], as appropriate) Diagnosis Code(s):        --- Professional ---                           K64.0, First degree hemorrhoids                           K92.1, Melena (includes Hematochezia) CPT copyright 2018 American [employer] Association. All rights reserved. The codes documented in this report are preliminary and upon coder review may  be revised to meet current compliance requirements. Cristopher Estimable. Welborn Keena, MD Norvel Richards, MD 11/03/2018 8:55:57 AM This report has been signed electronically. Number of Addenda: 0

## 2018-11-03 NOTE — Discharge Instructions (Signed)
Hemorrhoids °Hemorrhoids are swollen veins in and around the rectum or anus. There are two types of hemorrhoids: °Internal hemorrhoids. These occur in the veins that are just inside the rectum. They may poke through to the outside and become irritated and painful. °External hemorrhoids. These occur in the veins that are outside the anus and can be felt as a painful swelling or hard lump near the anus. °Most hemorrhoids do not cause serious problems, and they can be managed with home treatments such as diet and lifestyle changes. If home treatments do not help the symptoms, procedures can be done to shrink or remove the hemorrhoids. °What are the causes? °This condition is caused by increased pressure in the anal area. This pressure may result from various things, including: °Constipation. °Straining to have a bowel movement. °Diarrhea. °Pregnancy. °Obesity. °Sitting for long periods of time. °Heavy lifting or other activity that causes you to strain. °Anal sex. °Riding a bike for a long period of time. °What are the signs or symptoms? °Symptoms of this condition include: °Pain. °Anal itching or irritation. °Rectal bleeding. °Leakage of stool (feces). °Anal swelling. °One or more lumps around the anus. °How is this diagnosed? °This condition can often be diagnosed through a visual exam. Other exams or tests may also be done, such as: °An exam that involves feeling the rectal area with a gloved hand (digital rectal exam). °An exam of the anal canal that is done using a small tube (anoscope). °A blood test, if you have lost a significant amount of blood. °A test to look inside the colon using a flexible tube with a camera on the end (sigmoidoscopy or colonoscopy). °How is this treated? °This condition can usually be treated at home. However, various procedures may be done if dietary changes, lifestyle changes, and other home treatments do not help your symptoms. These procedures can help make the hemorrhoids smaller or  remove them completely. Some of these procedures involve surgery, and others do not. Common procedures include: °Rubber band ligation. Rubber bands are placed at the base of the hemorrhoids to cut off their blood supply. °Sclerotherapy. Medicine is injected into the hemorrhoids to shrink them. °Infrared coagulation. A type of light energy is used to get rid of the hemorrhoids. °Hemorrhoidectomy surgery. The hemorrhoids are surgically removed, and the veins that supply them are tied off. °Stapled hemorrhoidopexy surgery. The surgeon staples the base of the hemorrhoid to the rectal wall. °Follow these instructions at home: °Eating and drinking ° °Eat foods that have a lot of fiber in them, such as whole grains, beans, nuts, fruits, and vegetables. °Ask your health care provider about taking products that have added fiber (fiber supplements). °Reduce the amount of fat in your diet. You can do this by eating low-fat dairy products, eating less red meat, and avoiding processed foods. °Drink enough fluid to keep your urine pale yellow. °Managing pain and swelling ° °Take warm sitz baths for 20 minutes, 3-4 times a day to ease pain and discomfort. You may do this in a bathtub or using a portable sitz bath that fits over the toilet. °If directed, apply ice to the affected area. Using ice packs between sitz baths may be helpful. °Put ice in a plastic bag. °Place a towel between your skin and the bag. °Leave the ice on for 20 minutes, 2-3 times a day. °General instructions °Take over-the-counter and prescription medicines only as told by your health care provider. °Use medicated creams or suppositories as told. °Get regular exercise.   Ask your health care provider how much and what kind of exercise is best for you. In general, you should do moderate exercise for at least 30 minutes on most days of the week (150 minutes each week). This can include activities such as walking, biking, or yoga. Go to the bathroom when you have  the urge to have a bowel movement. Do not wait. Avoid straining to have bowel movements. Keep the anal area dry and clean. Use wet toilet paper or moist towelettes after a bowel movement. Do not sit on the toilet for long periods of time. This increases blood pooling and pain. Keep all follow-up visits as told by your health care provider. This is important. Contact a health care provider if you have: Increasing pain and swelling that are not controlled by treatment or medicine. Difficulty having a bowel movement, or you are unable to have a bowel movement. Pain or inflammation outside the area of the hemorrhoids. Get help right away if you have: Uncontrolled bleeding from your rectum. Summary Hemorrhoids are swollen veins in and around the rectum or anus. Most hemorrhoids can be managed with home treatments such as diet and lifestyle changes. Taking warm sitz baths can help ease pain and discomfort. In severe cases, procedures or surgery can be done to shrink or remove the hemorrhoids. This information is not intended to replace advice given to you by your health care provider. Make sure you discuss any questions you have with your health care provider. Document Released: 10/03/2000 Document Revised: 02/25/2018 Document Reviewed: 02/25/2018 Elsevier Interactive Patient Education  2019 [place].  Colonoscopy Discharge Instructions  Read the instructions outlined below and refer to this sheet in the next few weeks. These discharge instructions provide you with general information on caring for yourself after you leave the hospital. Your doctor may also give you specific instructions. While your treatment has been planned according to the most current medical practices available, unavoidable complications occasionally occur. If you have any problems or questions after discharge, call Dr. Gala Romney at [phone]. ACTIVITY  You may resume your regular activity, but move at a slower pace for the  next 24 hours.   Take frequent rest periods for the next 24 hours.   Walking will help get rid of the air and reduce the bloated feeling in your belly (abdomen).   No driving for 24 hours (because of the medicine (anesthesia) used during the test).    Do not sign any important legal documents or operate any machinery for 24 hours (because of the anesthesia used during the test).  NUTRITION  Drink plenty of fluids.   You may resume your normal diet as instructed by your doctor.   Begin with a light meal and progress to your normal diet. Heavy or fried foods are harder to digest and may make you feel sick to your stomach (nauseated).   Avoid alcoholic beverages for 24 hours or as instructed.  MEDICATIONS  You may resume your normal medications unless your doctor tells you otherwise.  WHAT YOU CAN EXPECT TODAY  Some feelings of bloating in the abdomen.   Passage of more gas than usual.   Spotting of blood in your stool or on the toilet paper.  IF YOU HAD POLYPS REMOVED DURING THE COLONOSCOPY:  No aspirin products for 7 days or as instructed.   No alcohol for 7 days or as instructed.   Eat a soft diet for the next 24 hours.  FINDING OUT THE RESULTS OF YOUR  TEST Not all test results are available during your visit. If your test results are not back during the visit, make an appointment with your caregiver to find out the results. Do not assume everything is normal if you have not heard from your caregiver or the medical facility. It is important for you to follow up on all of your test results.  SEEK IMMEDIATE MEDICAL ATTENTION IF:  You have more than a spotting of blood in your stool.   Your belly is swollen (abdominal distention).   You are nauseated or vomiting.   You have a temperature over 101.   You have abdominal pain or discomfort that is severe or gets worse throughout the day.   Hemorrhoid information provided  Repeat colonoscopy in 5 years  Ask Dr. Nevada Crane  about your platelet count next time you see him  Nodule felt at the anus would require a minor surgical procedure to remove.  Dr. Dalbert Batman would be the physician to see.

## 2018-11-03 NOTE — H&P (Signed)
@LOGO @   Primary Care Physician:  [name], MD Primary Gastroenterologist:  Dr. Gala Romney  Pre-Procedure History & Physical: HPI:  Jeffrey [name] is a 68 y.o. male here for a colonoscopy.  Intermittent paper hematochezia.  History of colonic adenoma.  Past Medical History:  Diagnosis Date  . Abdominal distension   . Abdominal pain   . Cancer ([facility])    skin cancer-chest and neck Melonoma right side of chest  . GERD (gastroesophageal reflux disease)   . Hypercholesteremia   . Ventral hernia   . Wears glasses     Past Surgical History:  Procedure Laterality Date  . BACK SURGERY  12/03/2015   Lumbar Fusion Lumber 2-3, 3-4,4-5  . BOWEL RESECTION  03/30/2012   Procedure: SMALL BOWEL RESECTION;  Surgeon: Donato Heinz, MD;  [facility]: [facility];  Service: General;  Laterality: N/A;  . COLONOSCOPY N/A 01/10/2014   Procedure: COLONOSCOPY;  Surgeon: Daneil Dolin, MD;  [facility];  Service: Endoscopy;  Laterality: N/A;  1:30 PM  . CYSTECTOMY     right thumb 2o yrs  . CYSTECTOMY    . HERNIA REPAIR     umbilical hernia [id number] yrs ago  . INCISIONAL HERNIA REPAIR  10/22/2012   Procedure: LAPAROSCOPIC INCISIONAL HERNIA;  Surgeon: Adin Hector, MD;  [facility]: [facility];  Service: General;  Laterality: N/A;  incarcerated  . INSERTION OF MESH  10/22/2012   Procedure: INSERTION OF MESH;  Surgeon: Adin Hector, MD;  [facility];  Service: General;  Laterality: N/A;  . KNEE ARTHROSCOPY     with bone removal-right leg- 2 yrs ago  . LAPAROTOMY  03/30/2012   Procedure: EXPLORATORY LAPAROTOMY;  Surgeon: Donato Heinz, MD;  [facility];  Service: General;  Laterality: N/A;  . MASS EXCISION  12/11/2011   Procedure: EXCISION MASS;  Surgeon: Marcheta Grammes, DPM;  Location: [facility];  Service: Orthopedics;  Laterality: Left;  Excision of plantar fibroma  . REPAIR OF PERONEUS BREVIS TENDON Left 03/09/2014   Procedure: LEFT PERONEUS BREVIS LONGUS TENOLYSIS AND TENDON REPAIR  ;  Surgeon: Wylene Simmer, MD;  Location: [facility];  Service: Orthopedics;  Laterality: Left;  . WOUND EXPLORATION N/A 02/03/2017   Procedure: EXPLORE ABDOMINAL WOUND, INJECT ABSOLUTE ALCOHOL;  Surgeon: Fanny Skates, MD;  Location: [facility];  Service: General;  Laterality: N/A;    Prior to Admission medications   Medication Sig Start Date End Date Taking? Authorizing Provider  aspirin EC 81 MG tablet Take 81 mg by mouth at bedtime.   Yes [provider]  esomeprazole (NEXIUM) 20 MG capsule Take 20 mg by mouth daily as needed (heartburn).    Yes [provider]  Multiple Vitamin (MULTIVITAMIN WITH MINERALS) TABS tablet Take 1 tablet by mouth daily.   Yes [provider]  Olopatadine HCl (PATADAY) 0.2 % SOLN Place 1 drop into both ears daily as needed (eye irritation).    Yes [provider]  polyethylene glycol powder (GLYCOLAX/MIRALAX) powder Take 17 g by mouth daily. In [place] of water Patient taking differently: Take 17 g by mouth every other day. In [place] of water 07/22/17  Yes Labarron Durnin, Cristopher Estimable, MD  rosuvastatin (CRESTOR) 10 MG tablet Take 10 mg by mouth at bedtime.    Yes [provider]  senna (SENOKOT) 8.6 MG tablet Take 1 tablet by mouth daily as needed for constipation.    Yes [provider]  fluticasone (FLONASE) 50 MCG/ACT nasal  spray Place 1 spray into both nostrils daily as needed for allergies.     [provider]    Allergies as of 09/14/2018 - Review Complete 09/14/2018  Allergen Reaction Noted  . No known allergies  02/02/2017    Family History  Problem Relation Age of Onset  . Cancer Mother        breast  . Stroke Mother   . Anesthesia problems Neg Hx   . Hypotension Neg Hx   . Malignant hyperthermia Neg Hx   . Pseudochol deficiency Neg Hx     Social History   Socioeconomic History  . Marital status: Married    Spouse name: Not on file  . Number of children: Not on file  . Years of  education: Not on file  . Highest education level: Not on file  Occupational History  . Not on file  Social Needs  . Financial resource strain: Not on file  . Food insecurity:    Worry: Not on file    Inability: Not on file  . Transportation needs:    Medical: Not on file    Non-medical: Not on file  Tobacco Use  . Smoking status: Never Smoker  . Smokeless tobacco: Never Used  Substance and Sexual Activity  . Alcohol use: No    Comment: a beer on occasion  . Drug use: No  . Sexual activity: Yes  Lifestyle  . Physical activity:    Days per week: Not on file    Minutes per session: Not on file  . Stress: Not on file  Relationships  . Social connections:    Talks on phone: Not on file    Gets together: Not on file    Attends religious service: Not on file    Active member of club or organization: Not on file    Attends meetings of clubs or organizations: Not on file    Relationship status: Not on file  . Intimate partner violence:    Fear of current or ex partner: Not on file    Emotionally abused: Not on file    Physically abused: Not on file    Forced sexual activity: Not on file  Other Topics Concern  . Not on file  Social History Narrative  . Not on file    Review of Systems: See HPI, otherwise negative ROS  Physical Exam: BP (!) 144/72   Pulse 81   Temp 98.1 F (36.7 C) (Oral)   Resp 16   Ht 5\' 8"  (1.727 m)   Wt 79.4 kg   SpO2 99%   BMI 26.61 kg/m  General:   Alert,  Well-developed, well-nourished, pleasant and cooperative in NAD Neck:  Supple; no masses or thyromegaly. No significant cervical adenopathy. Lungs:  Clear throughout to auscultation.   No wheezes, crackles, or rhonchi. No acute distress. Heart:  Regular rate and rhythm; no murmurs, clicks, rubs,  or gallops. Abdomen: Non-distended, normal bowel sounds.  Soft and nontender without appreciable mass or hepatosplenomegaly.  Pulses:  Normal pulses noted. Extremities:  Without clubbing or  edema.  Impression/Plan: Pleasant 68 year old gentleman with a history of colonic adenoma now with intermittent paper hematochezia.  Painless.  No abdominal pain. He is here for diagnostic colonoscopy.  The risks, benefits, limitations, alternatives and imponderables have been reviewed with the patient. Questions have been answered. All parties are agreeable.   I note going back a few years his platelet count has been running in the low 100s.  This  is of uncertain significance.  He has not had lab through the system in 2 years.  He tells me he does get labs periodically Dr. Juel Burrow.  I have asked him to simply follow-up with Dr. Nevada Crane to see how his platelet count has been running recently.       Notice: This dictation was prepared with Dragon dictation along with smaller phrase technology. Any transcriptional errors that result from this process are unintentional and may not be corrected upon review.

## 2018-11-04 DIAGNOSIS — N529 Male erectile dysfunction, unspecified: Secondary | ICD-10-CM | POA: Diagnosis not present

## 2018-11-04 DIAGNOSIS — R7301 Impaired fasting glucose: Secondary | ICD-10-CM | POA: Diagnosis not present

## 2018-11-04 DIAGNOSIS — Z125 Encounter for screening for malignant neoplasm of prostate: Secondary | ICD-10-CM | POA: Diagnosis not present

## 2018-11-04 DIAGNOSIS — E782 Mixed hyperlipidemia: Secondary | ICD-10-CM | POA: Diagnosis not present

## 2018-11-05 ENCOUNTER — Encounter (HOSPITAL_COMMUNITY): Payer: Self-pay | Admitting: Internal Medicine

## 2018-11-11 DIAGNOSIS — R7301 Impaired fasting glucose: Secondary | ICD-10-CM | POA: Diagnosis not present

## 2018-11-11 DIAGNOSIS — K219 Gastro-esophageal reflux disease without esophagitis: Secondary | ICD-10-CM | POA: Diagnosis not present

## 2018-11-11 DIAGNOSIS — M4800 Spinal stenosis, site unspecified: Secondary | ICD-10-CM | POA: Diagnosis not present

## 2018-11-11 DIAGNOSIS — E782 Mixed hyperlipidemia: Secondary | ICD-10-CM | POA: Diagnosis not present

## 2018-11-30 ENCOUNTER — Encounter: Payer: Self-pay | Admitting: Internal Medicine

## 2019-03-08 DIAGNOSIS — B029 Zoster without complications: Secondary | ICD-10-CM | POA: Diagnosis not present

## 2019-05-12 DIAGNOSIS — N529 Male erectile dysfunction, unspecified: Secondary | ICD-10-CM | POA: Diagnosis not present

## 2019-05-12 DIAGNOSIS — R7301 Impaired fasting glucose: Secondary | ICD-10-CM | POA: Diagnosis not present

## 2019-05-12 DIAGNOSIS — E782 Mixed hyperlipidemia: Secondary | ICD-10-CM | POA: Diagnosis not present

## 2019-05-19 DIAGNOSIS — R7301 Impaired fasting glucose: Secondary | ICD-10-CM | POA: Diagnosis not present

## 2019-05-19 DIAGNOSIS — E782 Mixed hyperlipidemia: Secondary | ICD-10-CM | POA: Diagnosis not present

## 2019-05-19 DIAGNOSIS — M4800 Spinal stenosis, site unspecified: Secondary | ICD-10-CM | POA: Diagnosis not present

## 2019-05-19 DIAGNOSIS — N529 Male erectile dysfunction, unspecified: Secondary | ICD-10-CM | POA: Diagnosis not present

## 2019-05-19 DIAGNOSIS — K219 Gastro-esophageal reflux disease without esophagitis: Secondary | ICD-10-CM | POA: Diagnosis not present

## 2019-05-23 ENCOUNTER — Other Ambulatory Visit: Payer: Self-pay

## 2019-05-24 DIAGNOSIS — M4316 Spondylolisthesis, lumbar region: Secondary | ICD-10-CM | POA: Diagnosis not present

## 2019-06-28 DIAGNOSIS — H8110 Benign paroxysmal vertigo, unspecified ear: Secondary | ICD-10-CM | POA: Diagnosis not present

## 2019-07-25 ENCOUNTER — Encounter: Payer: Self-pay | Admitting: *Deleted

## 2019-07-26 ENCOUNTER — Encounter: Payer: Self-pay | Admitting: Neurology

## 2019-07-26 ENCOUNTER — Telehealth: Payer: Self-pay | Admitting: Neurology

## 2019-07-26 ENCOUNTER — Ambulatory Visit (INDEPENDENT_AMBULATORY_CARE_PROVIDER_SITE_OTHER): Payer: Medicare Other | Admitting: Neurology

## 2019-07-26 ENCOUNTER — Other Ambulatory Visit: Payer: Self-pay

## 2019-07-26 VITALS — BP 158/85 | HR 71 | Temp 98.0°F | Ht 68.0 in | Wt 193.0 lb

## 2019-07-26 DIAGNOSIS — R42 Dizziness and giddiness: Secondary | ICD-10-CM

## 2019-07-26 DIAGNOSIS — R4701 Aphasia: Secondary | ICD-10-CM

## 2019-07-26 DIAGNOSIS — F809 Developmental disorder of speech and language, unspecified: Secondary | ICD-10-CM | POA: Diagnosis not present

## 2019-07-26 NOTE — Progress Notes (Signed)
PATIENT: Jeffrey [name] DOB: [birth year]  Chief Complaint  Patient presents with  . Dizziness    Reports one event of having word finding difficulty, heaviness in head and dizziness.  This occurred while he was working on his car.  Symptoms only lasted seconds to minutes before he was able to speak his thoughts.  He had a headache and fatigue after this episode.  He occasionally still has dizziness but really has not needed meclizine in weeks.   Marland Kitchen PCP    [name], MD     HISTORICAL  Jeffrey [name] is a 68 year old male, seen in request by his primary care physician Dr. Nevada Crane, Edwinna Areola for evaluation of dizziness, word finding difficulties, initial evaluation was on July 26, 2019.  I have reviewed and summarized the referring note from the referring physician.  He had a history of hyperlipidemia, small bowel resection in June 2013, had exploratory laparotomy.  He also had a history of lumbar decompression surgery, complains of bilateral lower extremity numbness tingling, left worse than right.  At the end of August 2020, while working underneath a car for a while, when he tried to get up, he noticed lightheaded dizziness, also word finding difficulties, when he tried to talk, the language was jumbled, last about 1 minute, after sit down resting for a while, it has much improved.  He denied loss of consciousness, he denies right arm weakness or numbness.  Since the initial episode, he complains of intermittent numbness tingling when get up from seated position, he has bilateral lower extremity paresthesia, was treated for peripheral neuropathy, when he get up quickly, he does complains of lightheadedness, dizziness,  He denies significant low back pain now, had a history of lumbar decompression in 2017, was radiating down his left leg, surgery did help his symptoms, he denies bilateral upper extremity paresthesia.  Laboratory evaluation in April 2018, normal CMP, CBC, with platelet of  138.   REVIEW OF SYSTEMS: Full 14 system review of systems performed and notable only for as above All other review of systems were negative.  ALLERGIES: No Known Allergies  HOME MEDICATIONS: Current Outpatient Medications  Medication Sig Dispense Refill  . aspirin EC 81 MG tablet Take 81 mg by mouth at bedtime.    Marland Kitchen atorvastatin (LIPITOR) 20 MG tablet Take 1 tablet by mouth daily.    . meclizine (ANTIVERT) 25 MG tablet Take 25 mg by mouth 4 (four) times daily as needed for dizziness.     . tadalafil (CIALIS) 20 MG tablet Take 20 mg by mouth as needed for erectile dysfunction.     No current facility-administered medications for this visit.     PAST MEDICAL HISTORY: Past Medical History:  Diagnosis Date  . Abdominal distension   . Abdominal pain   . Cancer ([place])    skin cancer-chest and neck Melonoma right side of chest  . Dizziness   . GERD (gastroesophageal reflux disease)   . Hypercholesteremia   . Ventral hernia   . Wears glasses     PAST SURGICAL HISTORY: Past Surgical History:  Procedure Laterality Date  . BACK SURGERY  12/03/2015   Lumbar Fusion Lumber 2-3, 3-4,4-5  . BOWEL RESECTION  03/30/2012   Procedure: SMALL BOWEL RESECTION;  Surgeon: Donato Heinz, MD;  [facility];  Service: General;  Laterality: N/A;  . COLONOSCOPY N/A 01/10/2014   Procedure: COLONOSCOPY;  Surgeon: Daneil Dolin, MD;  [facility];  Service: Endoscopy;  Laterality: N/A;  1:30 PM  . COLONOSCOPY N/A 11/03/2018   Procedure: COLONOSCOPY;  Surgeon: Daneil Dolin, MD;  [facility];  Service: Endoscopy;  Laterality: N/A;  7:30  . CYSTECTOMY     right thumb 2o yrs  . CYSTECTOMY    . HERNIA REPAIR     umbilical hernia [id number] yrs ago  . INCISIONAL HERNIA REPAIR  10/22/2012   Procedure: LAPAROSCOPIC INCISIONAL HERNIA;  Surgeon: Adin Hector, MD;  [facility];  Service: General;  Laterality: N/A;  incarcerated  . INSERTION OF MESH  10/22/2012   Procedure:  INSERTION OF MESH;  Surgeon: Adin Hector, MD;  [facility];  Service: General;  Laterality: N/A;  . KNEE ARTHROSCOPY     with bone removal-right leg- 2 yrs ago  . LAPAROTOMY  03/30/2012   Procedure: EXPLORATORY LAPAROTOMY;  Surgeon: Donato Heinz, MD;  [facility];  Service: General;  Laterality: N/A;  . MASS EXCISION  12/11/2011   Procedure: EXCISION MASS;  Surgeon: Marcheta Grammes, DPM;  Location[facility];  Service: Orthopedics;  Laterality: Left;  Excision of plantar fibroma  . REPAIR OF PERONEUS BREVIS TENDON Left 03/09/2014   Procedure: LEFT PERONEUS BREVIS LONGUS TENOLYSIS AND TENDON REPAIR ;  Surgeon: Wylene Simmer, MD;  Location: [facility];  Service: Orthopedics;  Laterality: Left;  . WOUND EXPLORATION N/A 02/03/2017   Procedure: EXPLORE ABDOMINAL WOUND, INJECT ABSOLUTE ALCOHOL;  Surgeon: Fanny Skates, MD;  Location: [facility];  Service: General;  Laterality: N/A;    FAMILY HISTORY: Family History  Problem Relation Age of Onset  . Stroke Mother   . Breast cancer Mother   . Dementia Father   . Heart disease Father   . Bladder Cancer Father   . Diabetes Father   . Diabetes Brother   . Diabetes Brother   . Diabetes Brother   . Diabetes Brother   . Anesthesia problems Neg Hx   . Hypotension Neg Hx   . Malignant hyperthermia Neg Hx   . Pseudochol deficiency Neg Hx     SOCIAL HISTORY: Social History   Socioeconomic History  . Marital status: Married    Spouse name: Not on file  . Number of children: 2  . Years of education: college  . Highest education level: Not on file  Occupational History  . Occupation: Retired  . Occupation: Likes to build cars.  Social Needs  . Financial resource strain: Not on file  . Food insecurity    Worry: Not on file    Inability: Not on file  . Transportation needs    Medical: Not on file    Non-medical: Not on file  Tobacco Use  . Smoking status: Never Smoker  . Smokeless tobacco: Never Used   Substance and Sexual Activity  . Alcohol use: Not Currently  . Drug use: No  . Sexual activity: Yes  Lifestyle  . Physical activity    Days per week: Not on file    Minutes per session: Not on file  . Stress: Not on file  Relationships  . Social Herbalist on phone: Not on file    Gets together: Not on file    Attends religious service: Not on file    Active member of club or organization: Not on file    Attends meetings of clubs or organizations: Not on file    Relationship status: Not on file  . Intimate partner violence  Fear of current or ex partner: Not on file    Emotionally abused: Not on file    Physically abused: Not on file    Forced sexual activity: Not on file  Other Topics Concern  . Not on file  Social History Narrative   Lives at home with his wife.   Right-handed.   Two cups caffeine per day.     PHYSICAL EXAM   Vitals:   07/26/19 0756  BP: (!) 158/85  Pulse: 71  Temp: 98 F (36.7 C)  Weight: 193 lb (87.5 kg)  Height: 5\' 8"  (1.727 m)    Not recorded      Body mass index is 29.35 kg/m.  PHYSICAL EXAMNIATION:  Gen: NAD, conversant, well nourised, well groomed                     Cardiovascular: Regular rate rhythm, no peripheral edema, warm, nontender. Eyes: Conjunctivae clear without exudates or hemorrhage Neck: Supple, no carotid bruits. Pulmonary: Clear to auscultation bilaterally   NEUROLOGICAL EXAM:  MENTAL STATUS: Speech:    Speech is normal; fluent and spontaneous with normal comprehension.  Cognition:     Orientation to time, place and person     Normal recent and remote memory     Normal Attention span and concentration     Normal Language, naming, repeating,spontaneous speech     Fund of knowledge   CRANIAL NERVES: CN II: Visual fields are full to confrontation.  Pupils are round equal and briskly reactive to light. CN III, IV, VI: extraocular movement are normal. No ptosis. CN V: Facial sensation is intact  to pinprick in all 3 divisions bilaterally. Corneal responses are intact.  CN VII: Face is symmetric with normal eye closure and smile. CN VIII: Hearing is normal to causal conversation. CN IX, X: Palate elevates symmetrically. Phonation is normal. CN XI: Head turning and shoulder shrug are intact CN XII: Tongue is midline with normal movements and no atrophy.  MOTOR: Muscle bulk and tone are normal. Muscle strength is normal.  REFLEXES: Reflexes are 2+ and symmetric at the biceps, triceps, absent at knees  and ankles. Plantar responses are flexor.  SENSORY: Length dependent decreased to to light touch, pinprick, and vibratory sensation to ankle level.Marland Kitchen  COORDINATION: Rapid alternating movements and fine finger movements are intact. There is no dysmetria on finger-to-nose and heel-knee-shin.    GAIT/STANCE: He needs push-up to get up from seated position,  steady, is able to perform tiptoe, heel, and tandem walking.   DIAGNOSTIC DATA (LABS, IMAGING, TESTING) - I reviewed patient records, labs, notes, testing and imaging myself where available.   ASSESSMENT AND PLAN  Jeffrey [name] is a 68 y.o. male     Episode of dizziness Word finding difficulties  Potentially localized to left frontal region  Differentiation diagnosis include peripheral neuropathy,  Proceed with EMG nerve conduction study  MRI of the brain without contrast to rule out structural lesion  Keep aspirin 81 mg daily  Marcial Pacas, M.D. Ph.D.  [facility] [address], [place], [place] [address] Ph: [phone] Fax: [phone]  CC: [name], MD

## 2019-07-26 NOTE — Telephone Encounter (Signed)
medicare/cigna supp order sent to [facility]. No auth they will reach out to the patient to schedule.  

## 2019-08-07 ENCOUNTER — Ambulatory Visit
Admission: RE | Admit: 2019-08-07 | Discharge: 2019-08-07 | Disposition: A | Discharge status: Home / Self Care | Payer: Medicare Other | Source: Ambulatory Visit | Attending: Neurology | Admitting: Neurology

## 2019-08-07 ENCOUNTER — Other Ambulatory Visit: Payer: Self-pay

## 2019-08-07 DIAGNOSIS — R4701 Aphasia: Secondary | ICD-10-CM | POA: Diagnosis not present

## 2019-08-08 ENCOUNTER — Ambulatory Visit: Payer: Medicare Other | Admitting: Neurology

## 2019-08-08 ENCOUNTER — Telehealth: Payer: Self-pay | Admitting: Neurology

## 2019-08-08 NOTE — Telephone Encounter (Signed)
I spoke to the patient and provided him with the test results below.  He verbalized understanding.

## 2019-08-08 NOTE — Telephone Encounter (Signed)
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 There is no acute abnormalities.  IMPRESSION: This MRI of the brain without contrast shows the following: 1.   Multiple T2/flair hyperintense foci in the subcortical and deep white matter both hemispheres.  This is a nonspecific finding but is most consistent with chronic microvascular ischemic change, more than expected for age.  None of the foci appear to be acute.  There were no large vessel strokes. 2.   Brain volume is normal for age. 3.   There were no acute findings.

## 2019-09-05 ENCOUNTER — Other Ambulatory Visit: Payer: Self-pay

## 2019-09-05 ENCOUNTER — Ambulatory Visit (INDEPENDENT_AMBULATORY_CARE_PROVIDER_SITE_OTHER): Payer: Medicare Other | Admitting: Neurology

## 2019-09-05 DIAGNOSIS — G629 Polyneuropathy, unspecified: Secondary | ICD-10-CM | POA: Diagnosis not present

## 2019-09-05 DIAGNOSIS — F809 Developmental disorder of speech and language, unspecified: Secondary | ICD-10-CM

## 2019-09-05 DIAGNOSIS — R42 Dizziness and giddiness: Secondary | ICD-10-CM

## 2019-09-05 DIAGNOSIS — R4701 Aphasia: Secondary | ICD-10-CM | POA: Insufficient documentation

## 2019-09-05 DIAGNOSIS — R7889 Finding of other specified substances, not normally found in blood: Secondary | ICD-10-CM | POA: Diagnosis not present

## 2019-09-05 DIAGNOSIS — E538 Deficiency of other specified B group vitamins: Secondary | ICD-10-CM | POA: Diagnosis not present

## 2019-09-05 DIAGNOSIS — R799 Abnormal finding of blood chemistry, unspecified: Secondary | ICD-10-CM | POA: Diagnosis not present

## 2019-09-05 DIAGNOSIS — E559 Vitamin D deficiency, unspecified: Secondary | ICD-10-CM | POA: Diagnosis not present

## 2019-09-05 NOTE — Procedures (Signed)
Full Name: Jeffrey [name] Gender: Male MRN #: [id number] Date of Birth: 1951-04-13    Visit Date: 09/05/2019 10:12 Age: 68 Years 53 Months Old Examining Physician: Marcial Pacas, MD  Referring Physician: Marcial Pacas, MD History: 68 year old male, with bilateral lower extremity paresthesia, low back pain, history of lumbar decompression surgery.  Summary of the tests: Nerve conduction study: Bilateral superficial peroneal sensory responses were absent.  Left sural sensory response showed moderately decreased snap amplitude.  Right sural sensory response was normal.  Left ulnar, radial sensory response showed mild to moderately decreased snap amplitude.  Right peroneal to EDB motor responses were normal.  Bilateral tibial and left peroneal to EDB motor responses showed mildly decreased CMAP amplitude, with moderate slow conduction velocity.  Left ulnar motor responses showed mildly prolonged distal latency, was otherwise normal.  Electromyography: Selected needle examination of bilateral lower extremity muscles was performed.  There is evidence of chronic neuropathic changes involving bilateral tibialis anterior, tibialis posterior, medial gastrocnemius, vastus lateralis, and the left peroneal longus.  Needle examination of bilateral lumbosacral paraspinal muscles was not performed per request of patient.  Conclusion: This is an abnormal study.  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  There is also evidence of chronic bilateral lumbosacral radiculopathy, mainly involve bilateral L4, L5 myotomes.   ------------------------------- Marcial Pacas, M.D. PhD  [facility] [place], [place] [address] Tel: [phone] Fax: [phone]        [facility]    Nerve / Sites Muscle Latency Ref. Amplitude Ref. Rel Amp Segments Distance Velocity Ref. Area    ms ms mV mV %  cm m/s m/s mVms  L Ulnar - ADM   Wrist ADM 3.8 ?3.3 9.9 ?6.0 100 Wrist - ADM 7   28.8     B.Elbow ADM 7.4  8.9  89.6 B.Elbow - Wrist 20 55 ?49 27.5     A.Elbow ADM 9.4  7.7  87 A.Elbow - B.Elbow 10 49 ?49 25.9         A.Elbow - Wrist      L Peroneal - EDB     Ankle EDB 5.4 ?6.5 1.7 ?2.0 100 Ankle - EDB 9   6.1     Fib head EDB 13.2  1.1  68.2 Fib head - Ankle 29 37 ?44 5.0     Pop fossa EDB 16.7  0.2  20.1 Pop fossa - Fib head 10 29 ?44 0.6         Pop fossa - Ankle      R Peroneal - EDB     Ankle EDB 6.5 ?6.5 3.2 ?2.0 100 Ankle - EDB 9   8.9     Fib head EDB 13.1  2.7  83.1 Fib head - Ankle 29 44 ?44 8.5     Pop fossa EDB 15.4  1.2  46 Pop fossa - Fib head 10 44 ?44 2.9         Pop fossa - Ankle      L Tibial - AH     Ankle AH 5.5 ?5.8 2.8 ?4.0 100 Ankle - AH 9   9.3     Pop fossa AH 16.5  1.6  57.1 Pop fossa - Ankle 37 34 ?41 5.4  R Tibial - AH     Ankle AH 5.2 ?5.8 3.0 ?4.0 100 Ankle - AH 9   6.6     Pop fossa AH 15.6  1.1  36.4 Pop fossa - Ankle 37 35 ?41 5.3                SNC    Nerve / Sites Rec. Site Peak Lat Ref.  Amp Ref. Segments Distance    ms ms V V  cm  L Radial - Anatomical snuff box (Forearm)     Forearm Wrist 2.4 ?2.9 6 ?15 Forearm - Wrist 10  L Sural - Ankle (Calf)     Calf Ankle 4.0 ?4.4 3 ?6 Calf - Ankle 14  R Sural - Ankle (Calf)     Calf Ankle 3.4 ?4.4 7 ?6 Calf - Ankle 14  L Superficial peroneal - Ankle     Lat leg Ankle NR ?4.4 NR ?6 Lat leg - Ankle 14  R Superficial peroneal - Ankle     Lat leg Ankle NR ?4.4 NR ?6 Lat leg - Ankle 14  L Ulnar - Orthodromic, (Dig V, Mid palm)     Dig V Wrist 3.3 ?3.1 3 ?5 Dig V - Wrist 52                   F  Wave    Nerve F Lat Ref.   ms ms  L Tibial - AH 61.2 ?56.0  R Tibial - AH 60.5 ?56.0  L Ulnar - ADM 30.9 ?32.0           EMG       EMG Summary Table    Spontaneous MUAP Recruitment  Muscle IA Fib PSW Fasc Other Amp Dur. Poly Pattern  L. Tibialis anterior Increased None None None _______ Increased Normal Normal Reduced  L. Tibialis  posterior Increased None None None _______ Increased Normal Normal Reduced  L. Peroneus longus Increased None None None _______ Normal Increased Normal Reduced  L. Vastus lateralis Normal None None None _______ Normal Normal Normal Reduced  L. Abductor hallucis Increased None None None _______ Normal Normal Normal Normal  R. Tibialis anterior Increased None None None _______ Increased Normal Normal Reduced  R. Gastrocnemius (Medial head) Increased None None None _______ Increased Normal Normal Reduced  R. Vastus lateralis Normal None None None _______ Normal Normal Normal Reduced

## 2019-09-05 NOTE — Progress Notes (Signed)
PATIENT: Jeffrey [name] DOB: 12/08/1950  No chief complaint on file.    HISTORICAL  Jeffrey [name] is a 68 year old male, seen in request by his primary care physician Dr. Nevada Crane, Edwinna Areola for evaluation of dizziness, word finding difficulties, initial evaluation was on July 26, 2019.  I have reviewed and summarized the referring note from the referring physician.  He had a history of hyperlipidemia, small bowel resection in June 2013, had exploratory laparotomy.  He also had a history of lumbar decompression surgery, complains of bilateral lower extremity numbness tingling, left worse than right.  At the end of August 2020, while working underneath a car for a while, when he tried to get up, he noticed lightheaded dizziness, also word finding difficulties, when he tried to talk, the language was jumbled, last about 1 minute, after sit down resting for a while, it has much improved.  He denied loss of consciousness, he denies right arm weakness or numbness.  Since the initial episode, he complains of intermittent numbness tingling when get up from seated position, he has bilateral lower extremity paresthesia, was treated for peripheral neuropathy, when he get up quickly, he does complains of lightheadedness, dizziness,  He denies significant low back pain now, had a history of lumbar decompression in 2017, was radiating down his left leg, surgery did help his symptoms, he denies bilateral upper extremity paresthesia.  Laboratory evaluation in April 2018, normal CMP, CBC, with platelet of 138.  UPDATE Sep 05 2019: He return for electrodiagnostic study today, which showed evidence of mild axonal peripheral neuropathy, also evidence of bilateral lumbosacral radiculopathy, consistent with history of lumbar decompression surgery.  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   REVIEW OF SYSTEMS: Full 14 system review of systems performed and notable only for as above All other review of systems were negative.  ALLERGIES: No Known Allergies  HOME MEDICATIONS: Current Outpatient Medications  Medication Sig Dispense Refill  . aspirin EC 81 MG tablet Take 81 mg by mouth at bedtime.    Marland Kitchen atorvastatin (LIPITOR) 20 MG tablet Take 1 tablet by mouth daily.    . meclizine (ANTIVERT) 25 MG tablet Take 25 mg by mouth 4 (four) times daily as needed for dizziness.     . tadalafil (CIALIS) 20 MG tablet Take 20 mg by mouth as needed for erectile dysfunction.     No current facility-administered medications for this visit.     PAST MEDICAL HISTORY: Past Medical History:  Diagnosis Date  . Abdominal distension   . Abdominal pain   . Cancer ([place])    skin cancer-chest and neck Melonoma right side of chest  . Dizziness   . GERD (gastroesophageal reflux disease)   . Hypercholesteremia   . Ventral hernia   . Wears glasses     PAST SURGICAL HISTORY: Past Surgical History:  Procedure Laterality Date  . BACK SURGERY  12/03/2015   Lumbar Fusion Lumber 2-3, 3-4,4-5  . BOWEL RESECTION  03/30/2012   Procedure: SMALL BOWEL RESECTION;  Surgeon: Donato Heinz, MD;  [facility];  Service: General;  Laterality: N/A;  . COLONOSCOPY N/A 01/10/2014   Procedure: COLONOSCOPY;  Surgeon: Daneil Dolin, MD;  [facility];  Service: Endoscopy;  Laterality: N/A;  1:30 PM  . COLONOSCOPY N/A 11/03/2018   Procedure: COLONOSCOPY;  Surgeon: Gala Romney,  Cristopher Estimable, MD;  [facility];  Service: Endoscopy;  Laterality: N/A;  7:30  . CYSTECTOMY     right thumb 2o yrs  . CYSTECTOMY    . HERNIA REPAIR     umbilical hernia [id number] yrs ago  . INCISIONAL HERNIA REPAIR  10/22/2012   Procedure: LAPAROSCOPIC INCISIONAL HERNIA;  Surgeon: Adin Hector, MD;  [facility]: [facility];  Service: General;  Laterality: N/A;  incarcerated  . INSERTION OF  MESH  10/22/2012   Procedure: INSERTION OF MESH;  Surgeon: Adin Hector, MD;  [facility];  Service: General;  Laterality: N/A;  . KNEE ARTHROSCOPY     with bone removal-right leg- 2 yrs ago  . LAPAROTOMY  03/30/2012   Procedure: EXPLORATORY LAPAROTOMY;  Surgeon: Donato Heinz, MD;  [facility];  Service: General;  Laterality: N/A;  . MASS EXCISION  12/11/2011   Procedure: EXCISION MASS;  Surgeon: Marcheta Grammes, DPM;  Location: [facility];  Service: Orthopedics;  Laterality: Left;  Excision of plantar fibroma  . REPAIR OF PERONEUS BREVIS TENDON Left 03/09/2014   Procedure: LEFT PERONEUS BREVIS LONGUS TENOLYSIS AND TENDON REPAIR ;  Surgeon: Wylene Simmer, MD;  Location: [facility];  Service: Orthopedics;  Laterality: Left;  . WOUND EXPLORATION N/A 02/03/2017   Procedure: EXPLORE ABDOMINAL WOUND, INJECT ABSOLUTE ALCOHOL;  Surgeon: Fanny Skates, MD;  Location: [facility];  Service: General;  Laterality: N/A;    FAMILY HISTORY: Family History  Problem Relation Age of Onset  . Stroke Mother   . Breast cancer Mother   . Dementia Father   . Heart disease Father   . Bladder Cancer Father   . Diabetes Father   . Diabetes Brother   . Diabetes Brother   . Diabetes Brother   . Diabetes Brother   . Anesthesia problems Neg Hx   . Hypotension Neg Hx   . Malignant hyperthermia Neg Hx   . Pseudochol deficiency Neg Hx     SOCIAL HISTORY: Social History   Socioeconomic History  . Marital status: Married    Spouse name: Not on file  . Number of children: 2  . Years of education: college  . Highest education level: Not on file  Occupational History  . Occupation: Retired  . Occupation: Likes to build cars.  Social Needs  . Financial resource strain: Not on file  . Food insecurity    Worry: Not on file    Inability: Not on file  . Transportation needs    Medical: Not on file    Non-medical: Not on file  Tobacco Use  . Smoking status: Never Smoker  . Smokeless  tobacco: Never Used  Substance and Sexual Activity  . Alcohol use: Not Currently  . Drug use: No  . Sexual activity: Yes  Lifestyle  . Physical activity    Days per week: Not on file    Minutes per session: Not on file  . Stress: Not on file  Relationships  . Social Herbalist on phone: Not on file    Gets together: Not on file    Attends religious service: Not on file    Active member of club or organization: Not on file    Attends meetings of clubs or organizations: Not on file    Relationship status: Not on file  . Intimate partner violence    Fear of current or ex partner: Not on file    Emotionally abused: Not on  file    Physically abused: Not on file    Forced sexual activity: Not on file  Other Topics Concern  . Not on file  Social History Narrative   Lives at home with his wife.   Right-handed.   Two cups caffeine per day.     PHYSICAL EXAM   There were no vitals filed for this visit.  Not recorded      There is no height or weight on file to calculate BMI.  NEUROLOGICAL EXAM:  MENTAL STATUS: Speech:    Speech is normal; fluent and spontaneous with normal comprehension.  Cognition:     Orientation to time, place and person     Normal recent and remote memory     Normal Attention span and concentration     Normal Language, naming, repeating,spontaneous speech     Fund of knowledge   CRANIAL NERVES: CN II: Visual fields are full to confrontation.  Pupils are round equal and briskly reactive to light. CN III, IV, VI: extraocular movement are normal. No ptosis. CN V: Facial sensation is intact to pinprick in all 3 divisions bilaterally. Corneal responses are intact.  CN VII: Face is symmetric with normal eye closure and smile. CN VIII: Hearing is normal to causal conversation. CN IX, X: Palate elevates symmetrically. Phonation is normal. CN XI: Head turning and shoulder shrug are intact CN XII: Tongue is midline with normal movements and no  atrophy.  MOTOR: Muscle bulk and tone are normal. Muscle strength is normal.  REFLEXES: Reflexes are 2+ and symmetric at the biceps, triceps, absent at knees and absent ankles. Plantar responses are flexor.  SENSORY: Length dependent decreased to to light touch, pinprick, and vibratory sensation to ankle level.Marland Kitchen  COORDINATION: Rapid alternating movements and fine finger movements are intact. There is no dysmetria on finger-to-nose and heel-knee-shin.    GAIT/STANCE: He needs push-up to get up from seated position,  steady, is able to perform tiptoe, heel, and tandem walking.   DIAGNOSTIC DATA (LABS, IMAGING, TESTING) - I reviewed patient records, labs, notes, testing and imaging myself where available.   ASSESSMENT AND PLAN  Jeffrey [name] is a 68 y.o. male     Peripheral neuropathy  Laboratory evaluations for etiology  Sudden onset of dizziness  MRI of the brain without contrast to rule out structural lesion  Keep aspirin 81 mg daily  Keep well hydration  Marcial Pacas, M.D. Ph.D.  [facility] [address], [place], [place] [address] Ph: [phone] Fax: [phone]  CC: [name], MD

## 2019-09-07 ENCOUNTER — Encounter: Payer: Self-pay | Admitting: Neurology

## 2019-09-07 LAB — PROTEIN ELECTROPHORESIS, SERUM
A/G Ratio: 1.3 (ref 0.7–1.7)
Albumin ELP: 4.1 g/dL (ref 2.9–4.4)
Alpha 1: 0.2 g/dL (ref 0.0–0.4)
Alpha 2: 0.7 g/dL (ref 0.4–1.0)
Beta: 1.1 g/dL (ref 0.7–1.3)
Gamma Globulin: 1.2 g/dL (ref 0.4–1.8)
Globulin, Total: 3.1 g/dL (ref 2.2–3.9)
M-Spike, %: 0.2 g/dL — ABNORMAL HIGH

## 2019-09-07 LAB — IMMUNOFIXATION ELECTROPHORESIS
IgA/Immunoglobulin A, Serum: 301 mg/dL (ref 61–437)
IgG (Immunoglobin G), Serum: 1227 mg/dL (ref 603–1613)
IgM (Immunoglobulin M), Srm: 38 mg/dL (ref 20–172)
Total Protein: 7.2 g/dL (ref 6.0–8.5)

## 2019-09-07 LAB — RPR: RPR Ser Ql: NONREACTIVE

## 2019-09-07 LAB — CBC WITH DIFFERENTIAL/PLATELET
Basophils Absolute: 0 10*3/uL (ref 0.0–0.2)
Basos: 1 %
EOS (ABSOLUTE): 0.1 10*3/uL (ref 0.0–0.4)
Eos: 1 %
Hematocrit: 38.8 % (ref 37.5–51.0)
Hemoglobin: 13.4 g/dL (ref 13.0–17.7)
Immature Grans (Abs): 0 10*3/uL (ref 0.0–0.1)
Immature Granulocytes: 0 %
Lymphocytes Absolute: 1.5 10*3/uL (ref 0.7–3.1)
Lymphs: 28 %
MCH: 32.5 pg (ref 26.6–33.0)
MCHC: 34.5 g/dL (ref 31.5–35.7)
MCV: 94 fL (ref 79–97)
Monocytes Absolute: 0.3 10*3/uL (ref 0.1–0.9)
Monocytes: 6 %
Neutrophils Absolute: 3.3 10*3/uL (ref 1.4–7.0)
Neutrophils: 64 %
Platelets: 166 10*3/uL (ref 150–450)
RBC: 4.12 x10E6/uL — ABNORMAL LOW (ref 4.14–5.80)
RDW: 12.1 % (ref 11.6–15.4)
WBC: 5.2 10*3/uL (ref 3.4–10.8)

## 2019-09-07 LAB — COMPREHENSIVE METABOLIC PANEL
ALT: 27 IU/L (ref 0–44)
AST: 24 IU/L (ref 0–40)
Albumin/Globulin Ratio: 1.9 (ref 1.2–2.2)
Albumin: 4.7 g/dL (ref 3.8–4.8)
Alkaline Phosphatase: 59 IU/L (ref 39–117)
BUN/Creatinine Ratio: 12 (ref 10–24)
BUN: 10 mg/dL (ref 8–27)
Bilirubin Total: 0.6 mg/dL (ref 0.0–1.2)
CO2: 23 mmol/L (ref 20–29)
Calcium: 9.8 mg/dL (ref 8.6–10.2)
Chloride: 103 mmol/L (ref 96–106)
Creatinine, Ser: 0.84 mg/dL (ref 0.76–1.27)
GFR calc Af Amer: 104 mL/min/{1.73_m2} (ref >59)
GFR calc non Af Amer: 90 mL/min/{1.73_m2} (ref >59)
Globulin, Total: 2.5 g/dL (ref 1.5–4.5)
Glucose: 100 mg/dL — ABNORMAL HIGH (ref 65–99)
Potassium: 4.6 mmol/L (ref 3.5–5.2)
Sodium: 141 mmol/L (ref 134–144)

## 2019-09-07 LAB — VITAMIN B12: Vitamin B-12: 531 pg/mL (ref 232–1245)

## 2019-09-07 LAB — HGB A1C W/O EAG: Hgb A1c MFr Bld: 5.4 % (ref 4.8–5.6)

## 2019-09-07 LAB — VITAMIN D 25 HYDROXY (VIT D DEFICIENCY, FRACTURES): Vit D, 25-Hydroxy: 27.7 ng/mL — ABNORMAL LOW (ref 30.0–100.0)

## 2019-09-07 LAB — FOLATE: Folate: 19.6 ng/mL (ref >3.0)

## 2019-09-07 LAB — ANA W/REFLEX IF POSITIVE: Anti Nuclear Antibody (ANA): NEGATIVE

## 2019-09-07 LAB — SEDIMENTATION RATE: Sed Rate: 4 mm/hr (ref 0–30)

## 2019-09-07 LAB — TSH: TSH: 1.69 u[IU]/mL (ref 0.450–4.500)

## 2019-09-08 DIAGNOSIS — Z23 Encounter for immunization: Secondary | ICD-10-CM | POA: Diagnosis not present

## 2019-11-11 DIAGNOSIS — E782 Mixed hyperlipidemia: Secondary | ICD-10-CM | POA: Diagnosis not present

## 2019-11-11 DIAGNOSIS — N529 Male erectile dysfunction, unspecified: Secondary | ICD-10-CM | POA: Diagnosis not present

## 2019-11-11 DIAGNOSIS — R7301 Impaired fasting glucose: Secondary | ICD-10-CM | POA: Diagnosis not present

## 2019-11-15 ENCOUNTER — Encounter: Payer: Self-pay | Admitting: Neurology

## 2019-11-15 ENCOUNTER — Other Ambulatory Visit: Payer: Self-pay

## 2019-11-15 ENCOUNTER — Ambulatory Visit (INDEPENDENT_AMBULATORY_CARE_PROVIDER_SITE_OTHER): Payer: Medicare Other | Admitting: Neurology

## 2019-11-15 VITALS — BP 142/83 | HR 72 | Temp 97.4°F | Ht 68.0 in | Wt 194.5 lb

## 2019-11-15 DIAGNOSIS — I679 Cerebrovascular disease, unspecified: Secondary | ICD-10-CM | POA: Insufficient documentation

## 2019-11-15 DIAGNOSIS — G629 Polyneuropathy, unspecified: Secondary | ICD-10-CM

## 2019-11-15 NOTE — Progress Notes (Signed)
PATIENT: Jeffrey [name] DOB: Jan 26, 1951  Chief Complaint  Patient presents with  . Aphasia/Dizziness    He is here,with his wife [name], to follow up from having MRI brain and NCV/EMG study.     HISTORICAL  Jeffrey [name] is a 69 year old male, seen in request by his primary care physician Dr. Nevada Crane, Edwinna Areola for evaluation of dizziness, word finding difficulties, initial evaluation was on July 26, 2019.  I have reviewed and summarized the referring note from the referring physician.  He had a history of hyperlipidemia, small bowel resection in June 2013, had exploratory laparotomy.  He also had a history of lumbar decompression surgery, complains of bilateral lower extremity numbness tingling, left worse than right.  At the end of August 2020, while working underneath a car for a while, when he tried to get up, he noticed lightheaded dizziness, also word finding difficulties, when he tried to talk, the language was jumbled, last about 1 minute, after sit down resting for a while, it has much improved.  He denied loss of consciousness, he denies right arm weakness or numbness.  Since the initial episode, he complains of intermittent numbness tingling when get up from seated position, he has bilateral lower extremity paresthesia, was treated for peripheral neuropathy, when he get up quickly, he does complains of lightheadedness, dizziness,  He denies significant low back pain now, had a history of lumbar decompression in 2017, was radiating down his left leg, surgery did help his symptoms, he denies bilateral upper extremity paresthesia.  Laboratory evaluation in April 2018, normal CMP, CBC, with platelet of 138.  UPDATE Sep 05 2019: He return for electrodiagnostic study today, which showed evidence of mild axonal peripheral neuropathy, also evidence of bilateral lumbosacral radiculopathy, consistent with history of lumbar decompression surgery.  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  Update November 15, 2019: He is accompanied by his wife at follow-up visit,  EMG nerve conduction study November 2020 showed evidence of mild axonal peripheral neuropathy, also evidence of chronic bilateral lumbosacral radiculopathy, consistent with his his previous history of lumbar decompression surgery.  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  ALLERGIES: No Known Allergies  HOME MEDICATIONS: Current Outpatient Medications  Medication Sig Dispense Refill  . aspirin EC 81 MG tablet Take 81 mg by mouth at bedtime.    Marland Kitchen atorvastatin (LIPITOR) 20 MG tablet Take 1 tablet by mouth daily.    . meclizine (ANTIVERT) 25 MG tablet Take 25 mg by mouth 4 (four) times daily as needed for dizziness.     . tadalafil (CIALIS) 20 MG tablet Take 20 mg by mouth as needed for erectile dysfunction.     No current facility-administered medications for this visit.  PAST MEDICAL HISTORY: Past Medical History:  Diagnosis Date  . Abdominal distension   . Abdominal pain   . Cancer ([place])    skin cancer-chest and neck Melonoma right side of chest  . Dizziness   . GERD (gastroesophageal  reflux disease)   . Hypercholesteremia   . Ventral hernia   . Wears glasses     PAST SURGICAL HISTORY: Past Surgical History:  Procedure Laterality Date  . BACK SURGERY  12/03/2015   Lumbar Fusion Lumber 2-3, 3-4,4-5  . BOWEL RESECTION  03/30/2012   Procedure: SMALL BOWEL RESECTION;  Surgeon: Donato Heinz, MD;  [facility];  Service: General;  Laterality: N/A;  . COLONOSCOPY N/A 01/10/2014   Procedure: COLONOSCOPY;  Surgeon: Daneil Dolin, MD;  [facility];  Service: Endoscopy;  Laterality: N/A;  1:30 PM  . COLONOSCOPY N/A 11/03/2018   Procedure: COLONOSCOPY;  Surgeon: Daneil Dolin, MD;  [facility];  Service: Endoscopy;  Laterality: N/A;  7:30  . CYSTECTOMY     right thumb 2o yrs  . CYSTECTOMY    . HERNIA REPAIR     umbilical hernia [id number] yrs ago  . INCISIONAL HERNIA REPAIR  10/22/2012   Procedure: LAPAROSCOPIC INCISIONAL HERNIA;  Surgeon: Adin Hector, MD;  [facility];  Service: General;  Laterality: N/A;  incarcerated  . INSERTION OF MESH  10/22/2012   Procedure: INSERTION OF MESH;  Surgeon: Adin Hector, MD;  [facility];  Service: General;  Laterality: N/A;  . KNEE ARTHROSCOPY     with bone removal-right leg- 2 yrs ago  . LAPAROTOMY  03/30/2012   Procedure: EXPLORATORY LAPAROTOMY;  Surgeon: Donato Heinz, MD;  [facility];  Service: General;  Laterality: N/A;  . MASS EXCISION  12/11/2011   Procedure: EXCISION MASS;  Surgeon: Marcheta Grammes, DPM;  [facility];  Service: Orthopedics;  Laterality: Left;  Excision of plantar fibroma  . REPAIR OF PERONEUS BREVIS TENDON Left 03/09/2014   Procedure: LEFT PERONEUS BREVIS LONGUS TENOLYSIS AND TENDON REPAIR ;  Surgeon: Wylene Simmer, MD;  Location: [facility];  Service: Orthopedics;  Laterality: Left;  . WOUND EXPLORATION N/A 02/03/2017   Procedure: EXPLORE ABDOMINAL WOUND, INJECT ABSOLUTE ALCOHOL;  Surgeon: Fanny Skates, MD;  Location: [facility];  Service:  General;  Laterality: N/A;    FAMILY HISTORY: Family History  Problem Relation Age of Onset  . Stroke Mother   . Breast cancer Mother   . Dementia Father   . Heart disease Father   . Bladder Cancer Father   . Diabetes Father   . Diabetes Brother   . Diabetes Brother   . Diabetes Brother   . Diabetes Brother   . Anesthesia problems Neg Hx   . Hypotension Neg Hx   . Malignant hyperthermia Neg Hx   . Pseudochol deficiency Neg Hx     SOCIAL HISTORY: Social History   Socioeconomic History  . Marital status: Married    Spouse name: Not on file  . Number of children: 2  . Years of education: college  . Highest education level: Not on file  Occupational History  . Occupation: Retired  . Occupation: Likes to build cars.  Tobacco Use  . Smoking status: Never Smoker  . Smokeless tobacco: Never Used  Substance and Sexual Activity  . Alcohol use: Not Currently  . Drug use: No  . Sexual activity: Yes  Other Topics Concern  . Not on file  Social History Narrative   Lives at home with his wife.   Right-handed.   Two cups caffeine per day.   Social Determinants of Health   Financial Resource Strain:   . Difficulty of Paying Living Expenses: Not on file  Food Insecurity:   . Worried About Charity fundraiser in the Last Year: Not on file  . Ran Out of Food in the Last Year: Not on file  Transportation Needs:   . Lack of Transportation (Medical): Not on file  . Lack of Transportation (Non-Medical): Not on file  Physical Activity:   . Days of Exercise per Week: Not on file  . Minutes of Exercise per Session: Not on file  Stress:   . Feeling of Stress : Not on file  Social Connections:   . Frequency of Communication with Friends and Family: Not on file  . Frequency of Social Gatherings with Friends and Family: Not on file  . Attends Religious Services: Not on file  . Active Member of Clubs or Organizations: Not on file  . Attends Archivist Meetings: Not on  file  . Marital Status: Not on file  Intimate Partner Violence:   . Fear of Current or Ex-Partner: Not on file  . Emotionally Abused: Not on file  . Physically Abused: Not on file  . Sexually Abused: Not on file     PHYSICAL EXAM   Vitals:   11/15/19 0858  BP: (!) 142/83  Pulse: 72  Temp: (!) 97.4 F (36.3 C)  Weight: 194 lb 8 oz (88.2 kg)  Height: 5\' 8"  (1.727 m)    Not recorded      Body mass index is 29.57 kg/m.  NEUROLOGICAL EXAM:  MENTAL STATUS: Speech:    Speech is normal; fluent and spontaneous with normal comprehension.  Cognition:     Orientation to time, place and person     Normal recent and remote memory     Normal Attention span and concentration     Normal Language, naming, repeating,spontaneous speech     Fund of knowledge   CRANIAL NERVES: CN II: Visual fields are full to confrontation.  Pupils are round equal and briskly reactive to light. CN III, IV, VI: extraocular movement are normal. No ptosis. CN V: Facial sensation is intact to pinprick in all 3 divisions bilaterally. Corneal responses are intact.  CN VII: Face is symmetric with normal eye closure and smile. CN VIII: Hearing is normal to causal conversation. CN IX, X: Palate elevates symmetrically. Phonation is normal. CN XI: Head turning and shoulder shrug are intact CN XII: Tongue is midline with normal movements and no atrophy.  MOTOR: Muscle bulk and tone are normal. Muscle strength is normal.  REFLEXES: Reflexes are 2+ and symmetric at the biceps, triceps, absent at knees and absent ankles. Plantar responses are flexor.  SENSORY: Length dependent decreased to to light touch, pinprick, and vibratory sensation to ankle level.Marland Kitchen  COORDINATION: Rapid alternating movements and fine finger movements are intact. There is no dysmetria on finger-to-nose and heel-knee-shin.    GAIT/STANCE: He needs push-up to get up from seated position,  steady, is able to perform tiptoe, heel, and  tandem walking.   DIAGNOSTIC DATA (LABS, IMAGING, TESTING) - I reviewed patient records, labs, notes, testing and imaging myself where available.   ASSESSMENT AND PLAN  DAVIAN AGREDANO is a 68 y.o. male     Peripheral neuropathy, Chronic lumbar radiculopathy  Laboratory evaluation showed no treatable etiology, other than  nonspecific low titer M protein of 0.2 g per DL, with immunofixation electrophoresis showed elevated IgG monoclonal protein with kappa light chain specificity  I have faxed the laboratory results to his primary care physician Dr. Wende Neighbors, may consider repeat protein electrophoresis/immunofixation electrophoresis in 6 months  Sudden onset of dizziness  Differentiation diagnosis including hypoperfusion of the brain, versus left temporal lobe TIA,  MRI of the brain showed moderate supratentorium small vessel diseaes  Keep aspirin 81 mg daily  Keep well hydration  ECHO  US carotid artery  Marcial Pacas, M.D. Ph.D.  [facility] [address], [place], [place] [address] Ph: [phone] Fax: [phone]  CC: [name], MD

## 2019-11-16 DIAGNOSIS — R7301 Impaired fasting glucose: Secondary | ICD-10-CM | POA: Diagnosis not present

## 2019-11-16 DIAGNOSIS — Z23 Encounter for immunization: Secondary | ICD-10-CM | POA: Diagnosis not present

## 2019-11-16 DIAGNOSIS — R9389 Abnormal findings on diagnostic imaging of other specified body structures: Secondary | ICD-10-CM | POA: Diagnosis not present

## 2019-11-16 DIAGNOSIS — E782 Mixed hyperlipidemia: Secondary | ICD-10-CM | POA: Diagnosis not present

## 2019-11-16 DIAGNOSIS — M4800 Spinal stenosis, site unspecified: Secondary | ICD-10-CM | POA: Diagnosis not present

## 2019-11-16 DIAGNOSIS — K219 Gastro-esophageal reflux disease without esophagitis: Secondary | ICD-10-CM | POA: Diagnosis not present

## 2019-11-16 DIAGNOSIS — N529 Male erectile dysfunction, unspecified: Secondary | ICD-10-CM | POA: Diagnosis not present

## 2019-11-16 DIAGNOSIS — E559 Vitamin D deficiency, unspecified: Secondary | ICD-10-CM | POA: Diagnosis not present

## 2019-11-16 DIAGNOSIS — L989 Disorder of the skin and subcutaneous tissue, unspecified: Secondary | ICD-10-CM | POA: Diagnosis not present

## 2019-11-25 ENCOUNTER — Ambulatory Visit (HOSPITAL_BASED_OUTPATIENT_CLINIC_OR_DEPARTMENT_OTHER)
Admission: RE | Admit: 2019-11-25 | Discharge: 2019-11-25 | Disposition: A | Discharge status: Home / Self Care | Payer: Medicare Other | Source: Ambulatory Visit | Attending: Neurology | Admitting: Neurology

## 2019-11-25 ENCOUNTER — Ambulatory Visit (HOSPITAL_COMMUNITY)
Admission: RE | Admit: 2019-11-25 | Discharge: 2019-11-25 | Disposition: A | Discharge status: Home / Self Care | Payer: Medicare Other | Source: Ambulatory Visit | Attending: Neurology | Admitting: Neurology

## 2019-11-25 ENCOUNTER — Telehealth: Payer: Self-pay | Admitting: Neurology

## 2019-11-25 ENCOUNTER — Other Ambulatory Visit: Payer: Self-pay

## 2019-11-25 ENCOUNTER — Other Ambulatory Visit (HOSPITAL_COMMUNITY): Payer: 59

## 2019-11-25 DIAGNOSIS — G459 Transient cerebral ischemic attack, unspecified: Secondary | ICD-10-CM

## 2019-11-25 DIAGNOSIS — K219 Gastro-esophageal reflux disease without esophagitis: Secondary | ICD-10-CM | POA: Diagnosis not present

## 2019-11-25 DIAGNOSIS — E785 Hyperlipidemia, unspecified: Secondary | ICD-10-CM | POA: Insufficient documentation

## 2019-11-25 DIAGNOSIS — G629 Polyneuropathy, unspecified: Secondary | ICD-10-CM

## 2019-11-25 DIAGNOSIS — I679 Cerebrovascular disease, unspecified: Secondary | ICD-10-CM

## 2019-11-25 NOTE — Telephone Encounter (Signed)
Please call patient, ultrasound of carotid arteries showed mild bilateral internal carotid artery stenosis, he should continue aspirin daily  Summary: Right Carotid: Velocities in the right ICA are consistent with a 1-39% stenosis.  Left Carotid: Velocities in the left ICA are consistent with a 1-39% stenosis.  Vertebrals: Bilateral vertebral arteries demonstrate antegrade flow.  *See table(s) above for measurements and observations.

## 2019-11-25 NOTE — Progress Notes (Signed)
  Echocardiogram 2D Echocardiogram has been performed.  Jeffrey [name] 11/25/2019, 8:43 AM

## 2019-11-25 NOTE — Progress Notes (Signed)
Carotid duplex has been completed.   Preliminary results in CV Proc.   Abram Sander 11/25/2019 8:59 AM

## 2019-11-28 NOTE — Telephone Encounter (Signed)
I spoke to the patient to notify him of the results below. He verbalized understanding to continue taking his aspirin 81mg  daily.

## 2019-12-01 ENCOUNTER — Other Ambulatory Visit: Payer: Self-pay | Admitting: Physician Assistant

## 2019-12-01 DIAGNOSIS — C4491 Basal cell carcinoma of skin, unspecified: Secondary | ICD-10-CM

## 2019-12-01 DIAGNOSIS — C4441 Basal cell carcinoma of skin of scalp and neck: Secondary | ICD-10-CM | POA: Diagnosis not present

## 2019-12-01 DIAGNOSIS — L570 Actinic keratosis: Secondary | ICD-10-CM | POA: Diagnosis not present

## 2019-12-01 DIAGNOSIS — D485 Neoplasm of uncertain behavior of skin: Secondary | ICD-10-CM | POA: Diagnosis not present

## 2019-12-01 HISTORY — DX: Basal cell carcinoma of skin, unspecified: C44.91

## 2019-12-15 DIAGNOSIS — Z23 Encounter for immunization: Secondary | ICD-10-CM | POA: Diagnosis not present

## 2020-01-13 DIAGNOSIS — Z23 Encounter for immunization: Secondary | ICD-10-CM | POA: Diagnosis not present

## 2020-05-14 ENCOUNTER — Ambulatory Visit: Payer: Medicare Other | Admitting: Neurology

## 2020-05-17 DIAGNOSIS — E782 Mixed hyperlipidemia: Secondary | ICD-10-CM | POA: Diagnosis not present

## 2020-05-17 DIAGNOSIS — R7301 Impaired fasting glucose: Secondary | ICD-10-CM | POA: Diagnosis not present

## 2020-05-17 DIAGNOSIS — E559 Vitamin D deficiency, unspecified: Secondary | ICD-10-CM | POA: Diagnosis not present

## 2020-05-17 DIAGNOSIS — N529 Male erectile dysfunction, unspecified: Secondary | ICD-10-CM | POA: Diagnosis not present

## 2020-05-25 DIAGNOSIS — M545 Low back pain: Secondary | ICD-10-CM | POA: Diagnosis not present

## 2020-05-25 DIAGNOSIS — M4316 Spondylolisthesis, lumbar region: Secondary | ICD-10-CM | POA: Diagnosis not present

## 2020-05-31 DIAGNOSIS — E559 Vitamin D deficiency, unspecified: Secondary | ICD-10-CM | POA: Diagnosis not present

## 2020-05-31 DIAGNOSIS — Z6828 Body mass index (BMI) 28.0-28.9, adult: Secondary | ICD-10-CM | POA: Diagnosis not present

## 2020-05-31 DIAGNOSIS — E782 Mixed hyperlipidemia: Secondary | ICD-10-CM | POA: Diagnosis not present

## 2020-05-31 DIAGNOSIS — K219 Gastro-esophageal reflux disease without esophagitis: Secondary | ICD-10-CM | POA: Diagnosis not present

## 2020-05-31 DIAGNOSIS — H539 Unspecified visual disturbance: Secondary | ICD-10-CM | POA: Diagnosis not present

## 2020-05-31 DIAGNOSIS — H8110 Benign paroxysmal vertigo, unspecified ear: Secondary | ICD-10-CM | POA: Diagnosis not present

## 2020-05-31 DIAGNOSIS — Z681 Body mass index (BMI) 19 or less, adult: Secondary | ICD-10-CM | POA: Diagnosis not present

## 2020-05-31 DIAGNOSIS — Z6830 Body mass index (BMI) 30.0-30.9, adult: Secondary | ICD-10-CM | POA: Diagnosis not present

## 2020-05-31 DIAGNOSIS — R9389 Abnormal findings on diagnostic imaging of other specified body structures: Secondary | ICD-10-CM | POA: Diagnosis not present

## 2020-05-31 DIAGNOSIS — H6692 Otitis media, unspecified, left ear: Secondary | ICD-10-CM | POA: Diagnosis not present

## 2020-05-31 DIAGNOSIS — R7301 Impaired fasting glucose: Secondary | ICD-10-CM | POA: Diagnosis not present

## 2020-05-31 DIAGNOSIS — N529 Male erectile dysfunction, unspecified: Secondary | ICD-10-CM | POA: Diagnosis not present

## 2020-05-31 DIAGNOSIS — L989 Disorder of the skin and subcutaneous tissue, unspecified: Secondary | ICD-10-CM | POA: Diagnosis not present

## 2020-05-31 DIAGNOSIS — J060 Acute laryngopharyngitis: Secondary | ICD-10-CM | POA: Diagnosis not present

## 2020-05-31 DIAGNOSIS — M4800 Spinal stenosis, site unspecified: Secondary | ICD-10-CM | POA: Diagnosis not present

## 2020-05-31 DIAGNOSIS — B029 Zoster without complications: Secondary | ICD-10-CM | POA: Diagnosis not present

## 2020-08-14 DIAGNOSIS — Z23 Encounter for immunization: Secondary | ICD-10-CM | POA: Diagnosis not present

## 2020-08-14 DIAGNOSIS — L237 Allergic contact dermatitis due to plants, except food: Secondary | ICD-10-CM | POA: Diagnosis not present

## 2020-11-23 DIAGNOSIS — M4316 Spondylolisthesis, lumbar region: Secondary | ICD-10-CM | POA: Diagnosis not present

## 2020-12-06 DIAGNOSIS — Z713 Dietary counseling and surveillance: Secondary | ICD-10-CM | POA: Diagnosis not present

## 2020-12-06 DIAGNOSIS — Z6828 Body mass index (BMI) 28.0-28.9, adult: Secondary | ICD-10-CM | POA: Diagnosis not present

## 2020-12-06 DIAGNOSIS — H8110 Benign paroxysmal vertigo, unspecified ear: Secondary | ICD-10-CM | POA: Diagnosis not present

## 2020-12-06 DIAGNOSIS — R9389 Abnormal findings on diagnostic imaging of other specified body structures: Secondary | ICD-10-CM | POA: Diagnosis not present

## 2020-12-06 DIAGNOSIS — Z23 Encounter for immunization: Secondary | ICD-10-CM | POA: Diagnosis not present

## 2020-12-06 DIAGNOSIS — L989 Disorder of the skin and subcutaneous tissue, unspecified: Secondary | ICD-10-CM | POA: Diagnosis not present

## 2020-12-06 DIAGNOSIS — Z125 Encounter for screening for malignant neoplasm of prostate: Secondary | ICD-10-CM | POA: Diagnosis not present

## 2020-12-06 DIAGNOSIS — E559 Vitamin D deficiency, unspecified: Secondary | ICD-10-CM | POA: Diagnosis not present

## 2020-12-06 DIAGNOSIS — R7301 Impaired fasting glucose: Secondary | ICD-10-CM | POA: Diagnosis not present

## 2020-12-06 DIAGNOSIS — N529 Male erectile dysfunction, unspecified: Secondary | ICD-10-CM | POA: Diagnosis not present

## 2020-12-06 DIAGNOSIS — H539 Unspecified visual disturbance: Secondary | ICD-10-CM | POA: Diagnosis not present

## 2020-12-06 DIAGNOSIS — Z1331 Encounter for screening for depression: Secondary | ICD-10-CM | POA: Diagnosis not present

## 2020-12-06 DIAGNOSIS — Z6830 Body mass index (BMI) 30.0-30.9, adult: Secondary | ICD-10-CM | POA: Diagnosis not present

## 2020-12-31 DIAGNOSIS — Z85820 Personal history of malignant melanoma of skin: Secondary | ICD-10-CM | POA: Diagnosis not present

## 2020-12-31 DIAGNOSIS — Z6829 Body mass index (BMI) 29.0-29.9, adult: Secondary | ICD-10-CM | POA: Diagnosis not present

## 2020-12-31 DIAGNOSIS — Z8249 Family history of ischemic heart disease and other diseases of the circulatory system: Secondary | ICD-10-CM | POA: Diagnosis not present

## 2020-12-31 DIAGNOSIS — R030 Elevated blood-pressure reading, without diagnosis of hypertension: Secondary | ICD-10-CM | POA: Diagnosis not present

## 2020-12-31 DIAGNOSIS — Z85828 Personal history of other malignant neoplasm of skin: Secondary | ICD-10-CM | POA: Diagnosis not present

## 2020-12-31 DIAGNOSIS — E663 Overweight: Secondary | ICD-10-CM | POA: Diagnosis not present

## 2020-12-31 DIAGNOSIS — E785 Hyperlipidemia, unspecified: Secondary | ICD-10-CM | POA: Diagnosis not present

## 2020-12-31 DIAGNOSIS — Z823 Family history of stroke: Secondary | ICD-10-CM | POA: Diagnosis not present

## 2020-12-31 DIAGNOSIS — Z833 Family history of diabetes mellitus: Secondary | ICD-10-CM | POA: Diagnosis not present

## 2021-02-19 DIAGNOSIS — R21 Rash and other nonspecific skin eruption: Secondary | ICD-10-CM | POA: Diagnosis not present

## 2021-02-19 DIAGNOSIS — J301 Allergic rhinitis due to pollen: Secondary | ICD-10-CM | POA: Diagnosis not present

## 2021-03-20 DIAGNOSIS — B356 Tinea cruris: Secondary | ICD-10-CM | POA: Diagnosis not present

## 2021-05-01 DIAGNOSIS — T1490XA Injury, unspecified, initial encounter: Secondary | ICD-10-CM | POA: Diagnosis not present

## 2021-06-06 DIAGNOSIS — D696 Thrombocytopenia, unspecified: Secondary | ICD-10-CM | POA: Diagnosis not present

## 2021-06-06 DIAGNOSIS — D472 Monoclonal gammopathy: Secondary | ICD-10-CM | POA: Diagnosis not present

## 2021-06-06 DIAGNOSIS — Z6829 Body mass index (BMI) 29.0-29.9, adult: Secondary | ICD-10-CM | POA: Diagnosis not present

## 2021-06-06 DIAGNOSIS — E785 Hyperlipidemia, unspecified: Secondary | ICD-10-CM | POA: Diagnosis not present

## 2021-06-06 DIAGNOSIS — Z8673 Personal history of transient ischemic attack (TIA), and cerebral infarction without residual deficits: Secondary | ICD-10-CM | POA: Diagnosis not present

## 2021-06-25 NOTE — Progress Notes (Signed)
[facility] [address], [place] [address]   CLINIC:  Medical Oncology/Hematology  Patient Care Team: [name], MD as PCP - General (Internal Medicine) Gala Romney, Cristopher Estimable, MD as Consulting Physician (Gastroenterology) Brien Mates, RN as Oncology Nurse Navigator (Oncology) Derek Jack, MD as Medical Oncologist (Oncology)  CHIEF COMPLAINTS/PURPOSE OF CONSULTATION:  Evaluation of plasma cell disorder  HISTORY OF PRESENTING ILLNESS:  Jeffrey [name] 70 y.o. male is here because of evaluation of plasma cell disorder, at the request of Dr. Willey Blade.  Today he reports feeling good. Within the past 6 months he reports infection in his groin as well as infection of a dog bite on his left calf that extended into his foot for which he was placed on a course of antibiotics. He denies any recent hospitalizations. He reports numbness and tingling in his legs and feet which has been present since his back surgery. He is able to do all of his typical home activities unassisted. He has a history of skin cancer, including a melanoma on his chest about 20 years ago. He has  never previously had a blood transfusion. He reports occasional mild ankle swellings. 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 He denies any smoking history. His brother has non-hodgkin's lymphoma, his mother had breast cancer, and his father had bladder cancer.   MEDICAL HISTORY:  Past Medical History:  Diagnosis Date   Abdominal distension    Abdominal pain    Cancer ([place])    skin cancer-chest and neck Melonoma right side of chest   Dizziness    GERD (gastroesophageal reflux disease)    Hypercholesteremia    Ventral hernia    Wears glasses     SURGICAL HISTORY: Past Surgical History:  Procedure Laterality Date   BACK SURGERY  12/03/2015   Lumbar Fusion Lumber 2-3, 3-4,4-5   BOWEL RESECTION  03/30/2012   Procedure: SMALL BOWEL  RESECTION;  Surgeon: Donato Heinz, MD;  [facility];  Service: General;  Laterality: N/A;   COLONOSCOPY N/A 01/10/2014   Procedure: COLONOSCOPY;  Surgeon: Daneil Dolin, MD;  [facility];  Service: Endoscopy;  Laterality: N/A;  1:30 PM   COLONOSCOPY N/A 11/03/2018   Procedure: COLONOSCOPY;  Surgeon: Daneil Dolin, MD;  [facility];  Service: Endoscopy;  Laterality: N/A;  7:30   CYSTECTOMY     right thumb 2o yrs   CYSTECTOMY     HERNIA REPAIR     umbilical hernia [id number] yrs ago   [place]  10/22/2012   Procedure: LAPAROSCOPIC INCISIONAL HERNIA;  Surgeon: Adin Hector, MD;  [facility];  Service: General;  Laterality: N/A;  incarcerated   INSERTION OF MESH  10/22/2012   Procedure: INSERTION OF MESH;  Surgeon: Adin Hector, MD;  [facility];  Service: General;  Laterality: N/A;   KNEE ARTHROSCOPY     with bone removal-right leg- 2 yrs ago   LAPAROTOMY  03/30/2012   Procedure: EXPLORATORY LAPAROTOMY;  Surgeon: Donato Heinz, MD;  [facility]: [facility];  Service: General;  Laterality: N/A;   MASS EXCISION  12/11/2011   Procedure: EXCISION MASS;  Surgeon: Marcheta Grammes, DPM;  [facility]: [facility];  Service: Orthopedics;  Laterality: Left;  Excision of plantar fibroma   REPAIR OF PERONEUS BREVIS TENDON Left 03/09/2014   Procedure: LEFT PERONEUS BREVIS LONGUS TENOLYSIS AND TENDON REPAIR ;  Surgeon: Jenny Reichmann  Doran Durand, MD;  Location: [facility];  Service: Orthopedics;  Laterality: Left;   WOUND EXPLORATION N/A 02/03/2017   Procedure: EXPLORE ABDOMINAL WOUND, INJECT ABSOLUTE ALCOHOL;  Surgeon: Fanny Skates, MD;  [facility];  Service: General;  Laterality: N/A;    SOCIAL HISTORY: Social History   Socioeconomic History   Marital status: Married    Spouse name: Not on file   Number of children: 2   Years of education: college   Highest education level: Not on file  Occupational History   Occupation: Retired    Occupation: Likes to build cars.  Tobacco Use   Smoking status: Never   Smokeless tobacco: Never  Vaping Use   Vaping Use: Never used  Substance and Sexual Activity   Alcohol use: Not Currently   Drug use: No   Sexual activity: Yes  Other Topics Concern   Not on file  Social History Narrative   Lives at home with his wife.   Right-handed.   Two cups caffeine per day.   Social Determinants of Health   Financial Resource Strain: Low Risk    Difficulty of Paying Living Expenses: Not hard at all  Food Insecurity: No Food Insecurity   Worried About Charity fundraiser in the Last Year: Never true   [place] in the Last Year: Never true  Transportation Needs: No Transportation Needs   Lack of Transportation (Medical): No   Lack of Transportation (Non-Medical): No  Physical Activity: Inactive   Days of Exercise per Week: 7 days   Minutes of Exercise per Session: 0 min  Stress: No Stress Concern Present   Feeling of Stress : Not at all  Social Connections: Moderately Integrated   Frequency of Communication with Friends and Family: More than three times a week   Frequency of Social Gatherings with Friends and Family: More than three times a week   Attends Religious Services: More than 4 times per year   Active Member of [employer] or Organizations: No   Attends Music therapist: Never   Marital Status: Married  Human resources officer Violence: Not At Risk   Fear of Current or Ex-Partner: No   Emotionally Abused: No   Physically Abused: No   Sexually Abused: No    FAMILY HISTORY: Family History  Problem Relation Age of Onset   Stroke Mother    Breast cancer Mother    Dementia Father    Heart disease Father    Bladder Cancer Father    Diabetes Father    Diabetes Brother    Diabetes Brother    Diabetes Brother    Diabetes Brother    Anesthesia problems Neg Hx    Hypotension Neg Hx    Malignant hyperthermia Neg Hx    Pseudochol deficiency Neg Hx      ALLERGIES:  has No Known Allergies.  MEDICATIONS:  Current Outpatient Medications  Medication Sig Dispense Refill   aspirin EC 81 MG tablet Take 81 mg by mouth at bedtime.     atorvastatin (LIPITOR) 20 MG tablet Take 1 tablet by mouth daily.     tadalafil (CIALIS) 20 MG tablet Take 20 mg by mouth as needed for erectile dysfunction. (Patient not taking: Reported on 06/26/2021)     No current facility-administered medications for this visit.    REVIEW OF SYSTEMS:   Review of Systems  Constitutional:  Negative for appetite change (80%) and fatigue (60%).  Cardiovascular:  Positive for leg swelling (occasional ankle).  Neurological:  Positive for dizziness, headaches (sinus) and numbness (legs and feet).  All other systems reviewed and are negative.   PHYSICAL EXAMINATION: ECOG PERFORMANCE STATUS: 1 - Symptomatic but completely ambulatory  Vitals:   06/26/21 0801  BP: (!) 148/71  Pulse: 73  Resp: 18  Temp: (!) 97 F (36.1 C)  SpO2: 98%   Filed Weights   06/26/21 0801  Weight: 185 lb (83.9 kg)   Physical Exam Vitals reviewed.  Constitutional:      Appearance: Normal appearance.  Cardiovascular:     Rate and Rhythm: Normal rate and regular rhythm.     Pulses: Normal pulses.     Heart sounds: Normal heart sounds.  Pulmonary:     Effort: Pulmonary effort is normal.     Breath sounds: Normal breath sounds.  Abdominal:     Palpations: Abdomen is soft. There is no hepatomegaly, splenomegaly or mass.     Tenderness: There is no abdominal tenderness.  Musculoskeletal:     Right lower leg: No edema.     Left lower leg: No edema.  Lymphadenopathy:     Upper Body:     Right upper body: No axillary or pectoral adenopathy.     Left upper body: No axillary or pectoral adenopathy.  Neurological:     General: No focal deficit present.     Mental Status: He is alert and oriented to person, place, and time.  Psychiatric:        Mood and Affect: Mood normal.        Behavior:  Behavior normal.     LABORATORY DATA:  I have reviewed the data as listed No results found for this or any previous visit (from the past 2160 hour(s)).  RADIOGRAPHIC STUDIES: I have personally reviewed the radiological images as listed and agreed with the findings in the report. No results found.  ASSESSMENT:  Plasma cell disorder: - Seen at the request of Dr. Willey Blade.  Labs on 06/06/2021 at his office showed normal CBC with hemoglobin 13.3.  SPEP showed M spike of 0.3 g.  Immunofixation shows IgG kappa. - He reports history of melanoma of skin resected 20 years ago. - He had abdominal surgery and had infections.  Denies any new onset bone pains.  He reports occasional tingling in the legs and feet from back surgery.  2.  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  Non-smoker. - Brother had non-Hodgkin's lymphoma.  Mother had breast cancer.  Dad had bladder cancer.   PLAN:  1.  IgG kappa plasma cell disorder: - We have discussed normal pathophysiology of plasma cell disorders and its prognosis. - We will do further work-up with free light chains, LDH, beta-2 microglobulin, comprehensive metabolic panel.  We will also do a skeletal survey. - We will check 24-hour urine for UPEP, total protein, light chains and immunofixation. - RTC 3 weeks for follow-up.   All questions were answered. The patient knows to call the clinic with any problems, questions or concerns.   Derek Jack, MD 06/26/21 8:45 AM  [place] [phone]   I, Thana Ates, am acting as a scribe for Dr. Derek Jack.  I, Derek Jack MD, have reviewed the above documentation for accuracy and completeness, and I agree with the above.

## 2021-06-26 ENCOUNTER — Ambulatory Visit (HOSPITAL_COMMUNITY)
Admission: RE | Admit: 2021-06-26 | Discharge: 2021-06-26 | Disposition: A | Discharge status: Home / Self Care | Payer: Medicare HMO | Source: Ambulatory Visit | Attending: Hematology | Admitting: Hematology

## 2021-06-26 ENCOUNTER — Other Ambulatory Visit: Payer: Self-pay

## 2021-06-26 ENCOUNTER — Inpatient Hospital Stay (HOSPITAL_COMMUNITY): Payer: Medicare HMO

## 2021-06-26 ENCOUNTER — Inpatient Hospital Stay (HOSPITAL_COMMUNITY): Payer: Medicare HMO | Attending: Hematology | Admitting: Hematology

## 2021-06-26 ENCOUNTER — Encounter (HOSPITAL_COMMUNITY): Payer: Self-pay | Admitting: Hematology

## 2021-06-26 DIAGNOSIS — E8809 Other disorders of plasma-protein metabolism, not elsewhere classified: Secondary | ICD-10-CM | POA: Insufficient documentation

## 2021-06-26 DIAGNOSIS — R7989 Other specified abnormal findings of blood chemistry: Secondary | ICD-10-CM | POA: Diagnosis present

## 2021-06-26 DIAGNOSIS — Z85820 Personal history of malignant melanoma of skin: Secondary | ICD-10-CM | POA: Insufficient documentation

## 2021-06-26 DIAGNOSIS — Z806 Family history of leukemia: Secondary | ICD-10-CM | POA: Insufficient documentation

## 2021-06-26 DIAGNOSIS — Z85828 Personal history of other malignant neoplasm of skin: Secondary | ICD-10-CM | POA: Diagnosis not present

## 2021-06-26 DIAGNOSIS — Z803 Family history of malignant neoplasm of breast: Secondary | ICD-10-CM | POA: Insufficient documentation

## 2021-06-26 DIAGNOSIS — C9000 Multiple myeloma not having achieved remission: Secondary | ICD-10-CM | POA: Diagnosis not present

## 2021-06-26 LAB — COMPREHENSIVE METABOLIC PANEL
ALT: 27 U/L (ref 0–44)
AST: 27 U/L (ref 15–41)
Albumin: 4.5 g/dL (ref 3.5–5.0)
Alkaline Phosphatase: 48 U/L (ref 38–126)
Anion gap: 6 (ref 5–15)
BUN: 12 mg/dL (ref 8–23)
CO2: 27 mmol/L (ref 22–32)
Calcium: 9.3 mg/dL (ref 8.9–10.3)
Chloride: 107 mmol/L (ref 98–111)
Creatinine, Ser: 0.8 mg/dL (ref 0.61–1.24)
GFR, Estimated: >60 mL/min (ref >60)
Glucose, Bld: 114 mg/dL — ABNORMAL HIGH (ref 70–99)
Potassium: 4.6 mmol/L (ref 3.5–5.1)
Sodium: 140 mmol/L (ref 135–145)
Total Bilirubin: 1.1 mg/dL (ref 0.3–1.2)
Total Protein: 7.4 g/dL (ref 6.5–8.1)

## 2021-06-26 LAB — LACTATE DEHYDROGENASE: LDH: 160 U/L (ref 98–192)

## 2021-06-26 NOTE — Patient Instructions (Addendum)
[facility] at [facility] Discharge Instructions  You were seen and examined today by Dr. Delton Coombes. You were referred to Dr. Delton Coombes due to an abnormal lab test that is concerning for multiple myeloma.  Dr. Delton Coombes has recommended additional lab work today. Dr. Delton Coombes has also recommended a 24-hr urine test, this will test to see if that abnormal protein is present in your urine as well. Dr. Delton Coombes has also recommended a whole body skeletal survey, this is an x-ray of your bones to ensure bone health.  Follow-up with Dr. Delton Coombes in about 3 weeks.   Thank you for choosing [facility] at [facility] to provide your oncology and hematology care.  To afford each patient quality time with our provider, please arrive at least 15 minutes before your scheduled appointment time.   If you have a lab appointment with the [facility] please come in thru the Main Entrance and check in at the main information desk.  You need to re-schedule your appointment should you arrive 10 or more minutes late.  We strive to give you quality time with our providers, and arriving late affects you and other patients whose appointments are after yours.  Also, if you no show three or more times for appointments you may be dismissed from the clinic at the providers discretion.     Again, thank you for choosing [facility].  Our hope is that these requests will decrease the amount of time that you wait before being seen by our physicians.       _____________________________________________________________  Should you have questions after your visit to [facility], please contact our office at [phone] and follow the prompts.  Our office hours are 8:00 a.m. and 4:30 p.m. Monday - Friday.  Please note that voicemails left after 4:00 p.m. may not be returned until the following business day.  We are closed weekends and major holidays.   You do have access to a nurse 24-7, just call the main number to the clinic [phone] and do not press any options, hold on the line and a nurse will answer the phone.    For prescription refill requests, have your pharmacy contact our office and allow 72 hours.    Due to Covid, you will need to wear a mask upon entering the hospital. If you do not have a mask, a mask will be given to you at the Main Entrance upon arrival. For doctor visits, patients may have 1 support person age 9 or older with them. For treatment visits, patients can not have anyone with them due to social distancing guidelines and our immunocompromised population.

## 2021-06-27 LAB — KAPPA/LAMBDA LIGHT CHAINS
Kappa free light chain: 22.5 mg/L — ABNORMAL HIGH (ref 3.3–19.4)
Kappa, lambda light chain ratio: 1.32 (ref 0.26–1.65)
Lambda free light chains: 17.1 mg/L (ref 5.7–26.3)

## 2021-06-27 LAB — BETA 2 MICROGLOBULIN, SERUM: Beta-2 Microglobulin: 1.4 mg/L (ref 0.6–2.4)

## 2021-06-28 ENCOUNTER — Other Ambulatory Visit (HOSPITAL_COMMUNITY): Payer: Self-pay

## 2021-06-28 DIAGNOSIS — Z85820 Personal history of malignant melanoma of skin: Secondary | ICD-10-CM | POA: Diagnosis not present

## 2021-06-28 DIAGNOSIS — E8809 Other disorders of plasma-protein metabolism, not elsewhere classified: Secondary | ICD-10-CM

## 2021-06-28 DIAGNOSIS — Z806 Family history of leukemia: Secondary | ICD-10-CM | POA: Diagnosis not present

## 2021-06-28 DIAGNOSIS — Z803 Family history of malignant neoplasm of breast: Secondary | ICD-10-CM | POA: Diagnosis not present

## 2021-06-28 DIAGNOSIS — Z85828 Personal history of other malignant neoplasm of skin: Secondary | ICD-10-CM | POA: Diagnosis not present

## 2021-06-28 DIAGNOSIS — R7989 Other specified abnormal findings of blood chemistry: Secondary | ICD-10-CM | POA: Diagnosis not present

## 2021-07-01 LAB — UPEP/UIFE/LIGHT CHAINS/TP, 24-HR UR
% BETA, Urine: 31.6 %
ALPHA 1 URINE: 5.1 %
Albumin, U: 32.5 %
Alpha 2, Urine: 9 %
Free Kappa Lt Chains,Ur: 34.77 mg/L (ref 1.17–86.46)
Free Kappa/Lambda Ratio: 8.42 (ref 1.83–14.26)
Free Lambda Lt Chains,Ur: 4.13 mg/L (ref 0.27–15.21)
GAMMA GLOBULIN URINE: 21.8 %
Total Protein, Urine-Ur/day: 114 mg/24 hr (ref 30–150)
Total Protein, Urine: 8.8 mg/dL
Total Volume: 1300

## 2021-07-08 ENCOUNTER — Ambulatory Visit (HOSPITAL_COMMUNITY): Payer: 59 | Admitting: Hematology

## 2021-07-16 NOTE — Progress Notes (Signed)
Jeffrey [name], [place] [address]   CLINIC:  Medical Oncology/Hematology  PCP:  [name], MD [address] [name] [address]  [phone]  REASON FOR VISIT:  Follow-up for plasma cell disorder  PRIOR THERAPY: none  CURRENT THERAPY: under work-up  INTERVAL HISTORY:  Jeffrey [name], a 70 y.o. male, returns for routine follow-up for his plasma cell disorder. Jeffrey [name] was last seen on 06/26/2021.  Today he reports feeling good.   REVIEW OF SYSTEMS:  Review of Systems  Constitutional:  Negative for appetite change and fatigue (50%).  All other systems reviewed and are negative.  PAST MEDICAL/SURGICAL HISTORY:  Past Medical History:  Diagnosis Date   Abdominal distension    Abdominal pain    Cancer ([place])    skin cancer-chest and neck Melonoma right side of chest   Dizziness    GERD (gastroesophageal reflux disease)    Hypercholesteremia    Ventral hernia    Wears glasses    Past Surgical History:  Procedure Laterality Date   BACK SURGERY  12/03/2015   Lumbar Fusion Lumber 2-3, 3-4,4-5   BOWEL RESECTION  03/30/2012   Procedure: SMALL BOWEL RESECTION;  Surgeon: Donato Heinz, MD;  [facility]: [facility];  Service: General;  Laterality: N/A;   COLONOSCOPY N/A 01/10/2014   Procedure: COLONOSCOPY;  Surgeon: Daneil Dolin, MD;  [facility];  Service: Endoscopy;  Laterality: N/A;  1:30 PM   COLONOSCOPY N/A 11/03/2018   Procedure: COLONOSCOPY;  Surgeon: Daneil Dolin, MD;  [facility];  Service: Endoscopy;  Laterality: N/A;  7:30   CYSTECTOMY     right thumb 2o yrs   CYSTECTOMY     HERNIA REPAIR     umbilical hernia WCHENI-7-7 yrs ago   [place]  10/22/2012   Procedure: LAPAROSCOPIC INCISIONAL HERNIA;  Surgeon: Adin Hector, MD;  [facility];  Service: General;  Laterality: N/A;  incarcerated   INSERTION OF MESH  10/22/2012   Procedure: INSERTION OF MESH;  Surgeon: Adin Hector, MD;  [facility];  Service: General;  Laterality: N/A;   KNEE ARTHROSCOPY     with bone removal-right leg- 2 yrs ago   LAPAROTOMY  03/30/2012   Procedure: EXPLORATORY LAPAROTOMY;  Surgeon: Donato Heinz, MD;  Location: [facility];  Service: General;  Laterality: N/A;   MASS EXCISION  12/11/2011   Procedure: EXCISION MASS;  Surgeon: Marcheta Grammes, DPM;  Location: [facility];  Service: Orthopedics;  Laterality: Left;  Excision of plantar fibroma   REPAIR OF PERONEUS BREVIS TENDON Left 03/09/2014   Procedure: LEFT PERONEUS BREVIS LONGUS TENOLYSIS AND TENDON REPAIR ;  Surgeon: Wylene Simmer, MD;  Location: [facility];  Service: Orthopedics;  Laterality: Left;   WOUND EXPLORATION N/A 02/03/2017   Procedure: EXPLORE ABDOMINAL WOUND, INJECT ABSOLUTE ALCOHOL;  Surgeon: Fanny Skates, MD;  [facility];  Service: General;  Laterality: N/A;    SOCIAL HISTORY:  Social History   Socioeconomic History   Marital status: Married    Spouse name: Not on file   Number of children: 2   Years of education: college   Highest education level: Not on file  Occupational History   Occupation: Retired   Occupation: Likes to build cars.  Tobacco Use   Smoking status: Never   Smokeless tobacco: Never  Vaping Use   Vaping Use: Never used  Substance and Sexual  Activity   Alcohol use: Not Currently   Drug use: No   Sexual activity: Yes  Other Topics Concern   Not on file  Social History Narrative   Lives at home with his wife.   Right-handed.   Two cups caffeine per day.   Social Determinants of Health   Financial Resource Strain: Low Risk    Difficulty of Paying Living Expenses: Not hard at all  Food Insecurity: No Food Insecurity   Worried About Charity fundraiser in the Last Year: Never true   [place] in the Last Year: Never true  Transportation Needs: No Transportation Needs   Lack of Transportation (Medical): No   Lack of Transportation (Non-Medical): No   Physical Activity: Inactive   Days of Exercise per Week: 7 days   Minutes of Exercise per Session: 0 min  Stress: No Stress Concern Present   Feeling of Stress : Not at all  Social Connections: Moderately Integrated   Frequency of Communication with Friends and Family: More than three times a week   Frequency of Social Gatherings with Friends and Family: More than three times a week   Attends Religious Services: More than 4 times per year   Active Member of [employer] or Organizations: No   Attends Music therapist: Never   Marital Status: Married  Human resources officer Violence: Not At Risk   Fear of Current or Ex-Partner: No   Emotionally Abused: No   Physically Abused: No   Sexually Abused: No    FAMILY HISTORY:  Family History  Problem Relation Age of Onset   Stroke Mother    Breast cancer Mother    Dementia Father    Heart disease Father    Bladder Cancer Father    Diabetes Father    Diabetes Brother    Diabetes Brother    Diabetes Brother    Diabetes Brother    Anesthesia problems Neg Hx    Hypotension Neg Hx    Malignant hyperthermia Neg Hx    Pseudochol deficiency Neg Hx     CURRENT MEDICATIONS:  Current Outpatient Medications  Medication Sig Dispense Refill   aspirin EC 81 MG tablet Take 81 mg by mouth at bedtime.     atorvastatin (LIPITOR) 20 MG tablet Take 1 tablet by mouth daily.     tadalafil (CIALIS) 20 MG tablet Take 20 mg by mouth as needed for erectile dysfunction. (Patient not taking: Reported on 06/26/2021)     No current facility-administered medications for this visit.    ALLERGIES:  No Known Allergies  PHYSICAL EXAM:  Performance status (ECOG): 1 - Symptomatic but completely ambulatory  There were no vitals filed for this visit. Wt Readings from Last 3 Encounters:  06/26/21 185 lb (83.9 kg)  11/15/19 194 lb 8 oz (88.2 kg)  07/26/19 193 lb (87.5 kg)   Physical Exam Vitals reviewed.  Constitutional:      Appearance: Normal  appearance.  Cardiovascular:     Rate and Rhythm: Normal rate and regular rhythm.     Pulses: Normal pulses.     Heart sounds: Normal heart sounds.  Pulmonary:     Effort: Pulmonary effort is normal.     Breath sounds: Normal breath sounds.  Neurological:     General: No focal deficit present.     Mental Status: He is alert and oriented to person, place, and time.  Psychiatric:        Mood and Affect: Mood normal.  Behavior: Behavior normal.    LABORATORY DATA:  I have reviewed the labs as listed.  CBC Latest Ref Rng & Units 09/05/2019 02/03/2017 12/04/2015  WBC 3.4 - 10.8 x10E3/uL 5.2 6.2 8.0  Hemoglobin 13.0 - 17.7 g/dL 13.4 14.4 10.3(L)  Hematocrit 37.5 - 51.0 % 38.8 41.5 30.9(L)  Platelets 150 - 450 x10E3/uL 166 138(L) 121(L)   CMP Latest Ref Rng & Units 06/26/2021 09/05/2019 02/03/2017  Glucose 70 - 99 mg/dL 114(H) 100(H) 93  BUN 8 - 23 mg/dL _0 Creatinine 0.61 - 1.24 mg/dL 0.80 0.84 0.81  Sodium 135 - 145 mmol/L 140 141 141  Potassium 3.5 - 5.1 mmol/L 4.6 4.6 4.1  Chloride 98 - 111 mmol/L 107 103 106  CO2 22 - 32 mmol/L _1 Calcium 8.9 - 10.3 mg/dL 9.3 9.8 9.4  Total Protein 6.5 - 8.1 g/dL 7.4 7.2 7.1  Total Bilirubin 0.3 - 1.2 mg/dL 1.1 0.6 0.9  Alkaline Phos 38 - 126 U/L 48 59 42  AST 15 - 41 U/L _2 ALT 0 - 44 U/L _3 Component Value Date/Time   RBC 4.12 (L) 09/05/2019 1221   RBC 4.48 02/03/2017 1243   MCV 94 09/05/2019 1221   MCH 32.5 09/05/2019 1221   MCH 32.1 02/03/2017 1243   MCHC 34.5 09/05/2019 1221   MCHC 34.7 02/03/2017 1243   RDW 12.1 09/05/2019 1221   LYMPHSABS 1.5 09/05/2019 1221   MONOABS 0.2 02/03/2017 1243   EOSABS 0.1 09/05/2019 1221   BASOSABS 0.0 09/05/2019 1221    DIAGNOSTIC IMAGING:  I have independently reviewed the scans and discussed with the patient. DG Bone Survey Met  Result Date: 06/27/2021 CLINICAL DATA:  History of multiple myeloma. EXAM: METASTATIC BONE SURVEY COMPARISON:  None. FINDINGS:  There 3 lucent lesions identified within the calvarium measuring up to 6 mm which are moderately suspicious for lesions of myeloma. These measure up to 6 mm. No additional lucent lesions identified within the remainder of the axial and appendicular skeleton. IMPRESSION: Three lucent lesions within the calvarium are moderately suspicious for lesions of myeloma. Consider further evaluation with PET-CT. Electronically Signed   By: Kerby Moors M.D.   On: 06/27/2021 14:48     ASSESSMENT:  Plasma cell disorder: - Seen at the request of Dr. Willey Blade.  Labs on 06/06/2021 at his office showed normal CBC with hemoglobin 13.3.  SPEP showed M spike of 0.3 g.  Immunofixation shows IgG kappa. - He reports history of melanoma of skin resected 20 years ago. - He had abdominal surgery and had infections.  Denies any new onset bone pains.  He reports occasional tingling in the legs and feet from back surgery.  2.  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  Non-smoker. - Brother had non-Hodgkin's lymphoma.  Mother had breast cancer.  Dad had bladder cancer   PLAN:  1.  IgG kappa plasma cell disorder: -We have reviewed labs from last visit.  LDH and beta-2 microglobulin was normal.  Calcium and creatinine were normal. - 24-hour urine was negative for M spike and immunofixation. - Reviewed skeletal survey which showed 3 lucent lesions in the calvarium.  These are moderately suspicious for plasma cell disorder. - Recommend PET CT scan and follow-up after that.  Orders placed this encounter:  No orders of the defined types were placed in this  encounter.    Derek Jack, MD [place] [phone]   I, Thana Ates, am acting as a scribe for Dr. Derek Jack.  I, Derek Jack MD, have reviewed the above documentation for accuracy and completeness, and I agree with the above.

## 2021-07-17 ENCOUNTER — Other Ambulatory Visit: Payer: Self-pay

## 2021-07-17 ENCOUNTER — Inpatient Hospital Stay (HOSPITAL_BASED_OUTPATIENT_CLINIC_OR_DEPARTMENT_OTHER): Payer: Medicare HMO | Admitting: Hematology

## 2021-07-17 ENCOUNTER — Encounter (HOSPITAL_COMMUNITY): Payer: Self-pay | Admitting: Hematology

## 2021-07-17 VITALS — BP 158/73 | HR 69 | Temp 97.7°F | Resp 17 | Wt 184.0 lb

## 2021-07-17 DIAGNOSIS — Z85828 Personal history of other malignant neoplasm of skin: Secondary | ICD-10-CM | POA: Diagnosis not present

## 2021-07-17 DIAGNOSIS — Z806 Family history of leukemia: Secondary | ICD-10-CM | POA: Diagnosis not present

## 2021-07-17 DIAGNOSIS — E8809 Other disorders of plasma-protein metabolism, not elsewhere classified: Secondary | ICD-10-CM | POA: Diagnosis not present

## 2021-07-17 DIAGNOSIS — R7989 Other specified abnormal findings of blood chemistry: Secondary | ICD-10-CM | POA: Diagnosis not present

## 2021-07-17 DIAGNOSIS — Z803 Family history of malignant neoplasm of breast: Secondary | ICD-10-CM | POA: Diagnosis not present

## 2021-07-17 DIAGNOSIS — Z85820 Personal history of malignant melanoma of skin: Secondary | ICD-10-CM | POA: Diagnosis not present

## 2021-07-17 NOTE — Patient Instructions (Addendum)
[place] at [facility] Discharge Instructions  You were seen today by Dr. Delton Coombes. He went over your recent results and scans. You will be scheduled for a PET scan. Dr. Delton Coombes will see you back in after your scan for follow up.   Thank you for choosing [place] at [facility] to provide your oncology and hematology care.  To afford each patient quality time with our provider, please arrive at least 15 minutes before your scheduled appointment time.   If you have a lab appointment with the [facility] please come in thru the Main Entrance and check in at the main information desk  You need to re-schedule your appointment should you arrive 10 or more minutes late.  We strive to give you quality time with our providers, and arriving late affects you and other patients whose appointments are after yours.  Also, if you no show three or more times for appointments you may be dismissed from the clinic at the providers discretion.     Again, thank you for choosing [facility].  Our hope is that these requests will decrease the amount of time that you wait before being seen by our physicians.       _____________________________________________________________  Should you have questions after your visit to [facility], please contact our office at [phone] between the hours of 8:00 a.m. and 4:30 p.m.  Voicemails left after 4:00 p.m. will not be returned until the following business day.  For prescription refill requests, have your pharmacy contact our office and allow 72 hours.    Cancer Center Support Programs:   > Cancer Support Group  2nd Tuesday of the month 1pm-2pm, Journey Room

## 2021-07-25 ENCOUNTER — Encounter (HOSPITAL_COMMUNITY)
Admission: RE | Admit: 2021-07-25 | Discharge: 2021-07-25 | Disposition: A | Discharge status: Home / Self Care | Payer: Medicare HMO | Source: Ambulatory Visit | Attending: Hematology | Admitting: Hematology

## 2021-07-25 ENCOUNTER — Encounter (HOSPITAL_COMMUNITY): Payer: Medicare HMO

## 2021-07-25 ENCOUNTER — Other Ambulatory Visit: Payer: Self-pay

## 2021-07-25 DIAGNOSIS — I2510 Atherosclerotic heart disease of native coronary artery without angina pectoris: Secondary | ICD-10-CM | POA: Diagnosis not present

## 2021-07-25 DIAGNOSIS — N200 Calculus of kidney: Secondary | ICD-10-CM | POA: Diagnosis not present

## 2021-07-25 DIAGNOSIS — K449 Diaphragmatic hernia without obstruction or gangrene: Secondary | ICD-10-CM | POA: Diagnosis not present

## 2021-07-25 DIAGNOSIS — K8020 Calculus of gallbladder without cholecystitis without obstruction: Secondary | ICD-10-CM | POA: Diagnosis not present

## 2021-07-25 DIAGNOSIS — E8809 Other disorders of plasma-protein metabolism, not elsewhere classified: Secondary | ICD-10-CM | POA: Diagnosis not present

## 2021-07-25 MED ORDER — FLUDEOXYGLUCOSE F - 18 (FDG) INJECTION
10.2220 | Freq: Once | INTRAVENOUS | Status: AC | PRN
  Administered 2021-07-25: 10.222 via INTRAVENOUS

## 2021-07-27 NOTE — Progress Notes (Signed)
[place], [place] [id number]   CLINIC:  Medical Oncology/Hematology  PCP:  [name], MD [address] [name] [address]  [phone]  REASON FOR VISIT:  Follow-up for plasma cell disorder  PRIOR THERAPY: none  CURRENT THERAPY: under work-up  INTERVAL HISTORY:  [name], a 70 y.o. male, returns for routine follow-up for his plasma cell disorder. Jonanthan was last seen on 07/17/2021.  Today he reports feeling good. He reports headaches, dizziness, and occasional urinary frequency.   REVIEW OF SYSTEMS:  Review of Systems  Constitutional:  Negative for appetite change (75%) and fatigue (50%).  Genitourinary:  Positive for frequency.   Neurological:  Positive for dizziness, headaches and numbness.  All other systems reviewed and are negative.  PAST MEDICAL/SURGICAL HISTORY:  Past Medical History:  Diagnosis Date   Abdominal distension    Abdominal pain    Cancer ([place])    skin cancer-chest and neck Melonoma right side of chest   Dizziness    GERD (gastroesophageal reflux disease)    Hypercholesteremia    Ventral hernia    Wears glasses    Past Surgical History:  Procedure Laterality Date   BACK SURGERY  12/03/2015   Lumbar Fusion Lumber 2-3, 3-4,4-5   BOWEL RESECTION  03/30/2012   Procedure: SMALL BOWEL RESECTION;  Surgeon: Donato Heinz, MD;  Location: [facility];  Service: General;  Laterality: N/A;   COLONOSCOPY N/A 01/10/2014   Procedure: COLONOSCOPY;  Surgeon: Daneil Dolin, MD;  Location: [facility];  Service: Endoscopy;  Laterality: N/A;  1:30 PM   COLONOSCOPY N/A 11/03/2018   Procedure: COLONOSCOPY;  Surgeon: Daneil Dolin, MD;  Location: [facility];  Service: Endoscopy;  Laterality: N/A;  7:30   CYSTECTOMY     right thumb 2o yrs   CYSTECTOMY     HERNIA REPAIR     umbilical hernia [id number] yrs ago   [place]  10/22/2012   Procedure: LAPAROSCOPIC INCISIONAL HERNIA;  Surgeon:  Adin Hector, MD;  Location: [facility];  Service: General;  Laterality: N/A;  incarcerated   INSERTION OF MESH  10/22/2012   Procedure: INSERTION OF MESH;  Surgeon: Adin Hector, MD;  Location: [facility];  Service: General;  Laterality: N/A;   KNEE ARTHROSCOPY     with bone removal-right leg- 2 yrs ago   LAPAROTOMY  03/30/2012   Procedure: EXPLORATORY LAPAROTOMY;  Surgeon: Donato Heinz, MD;  Location: [facility];  Service: General;  Laterality: N/A;   MASS EXCISION  12/11/2011   Procedure: EXCISION MASS;  Surgeon: Marcheta Grammes, DPM;  Location: [facility];  Service: Orthopedics;  Laterality: Left;  Excision of plantar fibroma   REPAIR OF PERONEUS BREVIS TENDON Left 03/09/2014   Procedure: LEFT PERONEUS BREVIS LONGUS TENOLYSIS AND TENDON REPAIR ;  Surgeon: Wylene Simmer, MD;  Location: [facility];  Service: Orthopedics;  Laterality: Left;   WOUND EXPLORATION N/A 02/03/2017   Procedure: EXPLORE ABDOMINAL WOUND, INJECT ABSOLUTE ALCOHOL;  Surgeon: Fanny Skates, MD;  Location: [facility];  Service: General;  Laterality: N/A;    SOCIAL HISTORY:  Social History   Socioeconomic History   Marital status: Married    Spouse name: Not on file   Number of children: 2   Years of education: college   Highest education level: Not on file  Occupational History   Occupation: Retired   Occupation: Likes to build cars.  Tobacco Use   Smoking status: Never   Smokeless tobacco: Never  Vaping Use   Vaping Use: Never used  Substance and Sexual Activity   Alcohol use: Not Currently   Drug use: No   Sexual activity: Yes  Other Topics Concern   Not on file  Social History Narrative   Lives at home with his wife.   Right-handed.   Two cups caffeine per day.   Social Determinants of Health   Financial Resource Strain: Low Risk    Difficulty of Paying Living Expenses: Not hard at all  Food Insecurity: No Food Insecurity   Worried About Charity fundraiser in the Last Year: Never true    [place] in the Last Year: Never true  Transportation Needs: No Transportation Needs   Lack of Transportation (Medical): No   Lack of Transportation (Non-Medical): No  Physical Activity: Inactive   Days of Exercise per Week: 7 days   Minutes of Exercise per Session: 0 min  Stress: No Stress Concern Present   Feeling of Stress : Not at all  Social Connections: Moderately Integrated   Frequency of Communication with Friends and Family: More than three times a week   Frequency of Social Gatherings with Friends and Family: More than three times a week   Attends Religious Services: More than 4 times per year   Active Member of [employer] or Organizations: No   Attends Music therapist: Never   Marital Status: Married  Human resources officer Violence: Not At Risk   Fear of Current or Ex-Partner: No   Emotionally Abused: No   Physically Abused: No   Sexually Abused: No    FAMILY HISTORY:  Family History  Problem Relation Age of Onset   Stroke Mother    Breast cancer Mother    Dementia Father    Heart disease Father    Bladder Cancer Father    Diabetes Father    Diabetes Brother    Diabetes Brother    Diabetes Brother    Diabetes Brother    Anesthesia problems Neg Hx    Hypotension Neg Hx    Malignant hyperthermia Neg Hx    Pseudochol deficiency Neg Hx     CURRENT MEDICATIONS:  Current Outpatient Medications  Medication Sig Dispense Refill   aspirin EC 81 MG tablet Take 81 mg by mouth at bedtime.     atorvastatin (LIPITOR) 20 MG tablet Take 1 tablet by mouth daily.     tadalafil (CIALIS) 20 MG tablet Take 20 mg by mouth as needed for erectile dysfunction.     No current facility-administered medications for this visit.    ALLERGIES:  No Known Allergies  PHYSICAL EXAM:  Performance status (ECOG): 1 - Symptomatic but completely ambulatory  There were no vitals filed for this visit. Wt Readings from Last 3 Encounters:  07/17/21 184 lb (83.5 kg)  06/26/21 185  lb (83.9 kg)  11/15/19 194 lb 8 oz (88.2 kg)   Physical Exam Vitals reviewed.  Constitutional:      Appearance: Normal appearance.  Cardiovascular:     Rate and Rhythm: Normal rate and regular rhythm.     Pulses: Normal pulses.     Heart sounds: Normal heart sounds.  Pulmonary:     Effort: Pulmonary effort is normal.     Breath sounds: Normal breath sounds.  Neurological:     General: No focal deficit present.     Mental Status: He is alert and oriented to  person, place, and time.  Psychiatric:        Mood and Affect: Mood normal.        Behavior: Behavior normal.    LABORATORY DATA:  I have reviewed the labs as listed.  CBC Latest Ref Rng & Units 09/05/2019 02/03/2017 12/04/2015  WBC 3.4 - 10.8 x10E3/uL 5.2 6.2 8.0  Hemoglobin 13.0 - 17.7 g/dL 13.4 14.4 10.3(L)  Hematocrit 37.5 - 51.0 % 38.8 41.5 30.9(L)  Platelets 150 - 450 x10E3/uL 166 138(L) 121(L)   CMP Latest Ref Rng & Units 06/26/2021 09/05/2019 02/03/2017  Glucose 70 - 99 mg/dL 114(H) 100(H) 93  BUN 8 - 23 mg/dL 12 10 11   Creatinine 0.61 - 1.24 mg/dL 0.80 0.84 0.81  Sodium 135 - 145 mmol/L 140 141 141  Potassium 3.5 - 5.1 mmol/L 4.6 4.6 4.1  Chloride 98 - 111 mmol/L 107 103 106  CO2 22 - 32 mmol/L 27 23 23   Calcium 8.9 - 10.3 mg/dL 9.3 9.8 9.4  Total Protein 6.5 - 8.1 g/dL 7.4 7.2 7.1  Total Bilirubin 0.3 - 1.2 mg/dL 1.1 0.6 0.9  Alkaline Phos 38 - 126 U/L 48 59 42  AST 15 - 41 U/L 27 24 30   ALT 0 - 44 U/L 27 27 27       Component Value Date/Time   RBC 4.12 (L) 09/05/2019 1221   RBC 4.48 02/03/2017 1243   MCV 94 09/05/2019 1221   MCH 32.5 09/05/2019 1221   MCH 32.1 02/03/2017 1243   MCHC 34.5 09/05/2019 1221   MCHC 34.7 02/03/2017 1243   RDW 12.1 09/05/2019 1221   LYMPHSABS 1.5 09/05/2019 1221   MONOABS 0.2 02/03/2017 1243   EOSABS 0.1 09/05/2019 1221   BASOSABS 0.0 09/05/2019 1221    DIAGNOSTIC IMAGING:  I have independently reviewed the scans and discussed with the patient. No results found.    ASSESSMENT:  Plasma cell disorder: - Seen at the request of Dr. Willey Blade.  Labs on 06/06/2021 at his office showed normal CBC with hemoglobin 13.3.  SPEP showed M spike of 0.3 g.  Immunofixation shows IgG kappa. - LDH and beta-2 microglobulin normal. - He reports history of melanoma of skin resected 20 years ago. - He had abdominal surgery and had infections.  Denies any new onset bone pains.  He reports occasional tingling in the legs and feet from back surgery. - 24-hour urine was negative for M spike on immunofixation. - Skeletal survey on 06/26/2021 showed 3 lucent lesions in the calvarium. - PET scan on 07/25/2021 with no evidence of metabolically active myeloma.  2.  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  Non-smoker. - Brother had non-Hodgkin's lymphoma.  Mother had breast cancer.  Dad had bladder cancer   PLAN:  1.  IgG kappa plasma cell disorder: - We have reviewed PET scan which did not show any evidence of metabolically active myeloma. - Incidental findings were discussed with the patient. - Recommend follow-up in 4 months with repeat SPEP, free light chains, CBC and CMP. - We will consider repeating x-ray of the skull in 6 to 12 months.  Orders placed this encounter:  No orders of the defined types were placed in this encounter.    Derek Jack, MD [place] [phone]   I, Thana Ates, am acting as a scribe for Dr. Derek Jack.  I, Derek Jack MD, have reviewed the above documentation for accuracy  and completeness, and I agree with the above.

## 2021-07-29 ENCOUNTER — Other Ambulatory Visit: Payer: Self-pay

## 2021-07-29 ENCOUNTER — Inpatient Hospital Stay (HOSPITAL_COMMUNITY): Payer: 59 | Attending: Hematology | Admitting: Hematology

## 2021-07-29 VITALS — BP 151/78 | HR 67 | Temp 96.7°F | Resp 17 | Wt 184.9 lb

## 2021-07-29 DIAGNOSIS — Z79899 Other long term (current) drug therapy: Secondary | ICD-10-CM | POA: Insufficient documentation

## 2021-07-29 DIAGNOSIS — K219 Gastro-esophageal reflux disease without esophagitis: Secondary | ICD-10-CM | POA: Insufficient documentation

## 2021-07-29 DIAGNOSIS — R42 Dizziness and giddiness: Secondary | ICD-10-CM | POA: Diagnosis not present

## 2021-07-29 DIAGNOSIS — R7989 Other specified abnormal findings of blood chemistry: Secondary | ICD-10-CM | POA: Insufficient documentation

## 2021-07-29 DIAGNOSIS — E8809 Other disorders of plasma-protein metabolism, not elsewhere classified: Secondary | ICD-10-CM

## 2021-07-29 DIAGNOSIS — Z803 Family history of malignant neoplasm of breast: Secondary | ICD-10-CM | POA: Insufficient documentation

## 2021-07-29 DIAGNOSIS — Z7982 Long term (current) use of aspirin: Secondary | ICD-10-CM | POA: Insufficient documentation

## 2021-07-29 DIAGNOSIS — E7800 Pure hypercholesterolemia, unspecified: Secondary | ICD-10-CM | POA: Diagnosis not present

## 2021-07-29 DIAGNOSIS — R350 Frequency of micturition: Secondary | ICD-10-CM | POA: Diagnosis not present

## 2021-07-29 DIAGNOSIS — R51 Headache with orthostatic component, not elsewhere classified: Secondary | ICD-10-CM | POA: Diagnosis not present

## 2021-07-29 NOTE — Patient Instructions (Signed)
[place] at [facility] Discharge Instructions  You were seen and examined by Dr. Delton Coombes. Return as scheduled in 4 months with lab work and office visit.    Thank you for choosing [place] at [facility] to provide your oncology and hematology care.  To afford each patient quality time with our provider, please arrive at least 15 minutes before your scheduled appointment time.   If you have a lab appointment with the [facility] please come in thru the Main Entrance and check in at the main information desk.  You need to re-schedule your appointment should you arrive 10 or more minutes late.  We strive to give you quality time with our providers, and arriving late affects you and other patients whose appointments are after yours.  Also, if you no show three or more times for appointments you may be dismissed from the clinic at the providers discretion.     Again, thank you for choosing [facility].  Our hope is that these requests will decrease the amount of time that you wait before being seen by our physicians.       _____________________________________________________________  Should you have questions after your visit to [facility], please contact our office at [phone] and follow the prompts.  Our office hours are 8:00 a.m. and 4:30 p.m. Monday - Friday.  Please note that voicemails left after 4:00 p.m. may not be returned until the following business day.  We are closed weekends and major holidays.  You do have access to a nurse 24-7, just call the main number to the clinic [phone] and do not press any options, hold on the line and a nurse will answer the phone.    For prescription refill requests, have your pharmacy contact our office and allow 72 hours.    Due to Covid, you will need to wear a mask upon entering the hospital. If you do not have a mask, a mask will be given to you at the Main  Entrance upon arrival. For doctor visits, patients may have 1 support person age 14 or older with them. For treatment visits, patients can not have anyone with them due to social distancing guidelines and our immunocompromised population.

## 2021-10-09 DIAGNOSIS — B356 Tinea cruris: Secondary | ICD-10-CM | POA: Diagnosis not present

## 2021-10-29 DIAGNOSIS — Z8249 Family history of ischemic heart disease and other diseases of the circulatory system: Secondary | ICD-10-CM | POA: Diagnosis not present

## 2021-10-29 DIAGNOSIS — Z6828 Body mass index (BMI) 28.0-28.9, adult: Secondary | ICD-10-CM | POA: Diagnosis not present

## 2021-10-29 DIAGNOSIS — E785 Hyperlipidemia, unspecified: Secondary | ICD-10-CM | POA: Diagnosis not present

## 2021-10-29 DIAGNOSIS — Z7982 Long term (current) use of aspirin: Secondary | ICD-10-CM | POA: Diagnosis not present

## 2021-10-29 DIAGNOSIS — Z008 Encounter for other general examination: Secondary | ICD-10-CM | POA: Diagnosis not present

## 2021-10-29 DIAGNOSIS — R030 Elevated blood-pressure reading, without diagnosis of hypertension: Secondary | ICD-10-CM | POA: Diagnosis not present

## 2021-10-29 DIAGNOSIS — Z823 Family history of stroke: Secondary | ICD-10-CM | POA: Diagnosis not present

## 2021-10-29 DIAGNOSIS — Z85828 Personal history of other malignant neoplasm of skin: Secondary | ICD-10-CM | POA: Diagnosis not present

## 2021-10-29 DIAGNOSIS — N529 Male erectile dysfunction, unspecified: Secondary | ICD-10-CM | POA: Diagnosis not present

## 2021-10-29 DIAGNOSIS — Z809 Family history of malignant neoplasm, unspecified: Secondary | ICD-10-CM | POA: Diagnosis not present

## 2021-10-29 DIAGNOSIS — E663 Overweight: Secondary | ICD-10-CM | POA: Diagnosis not present

## 2021-11-07 ENCOUNTER — Encounter: Payer: Self-pay | Admitting: Physician Assistant

## 2021-11-07 ENCOUNTER — Other Ambulatory Visit: Payer: Self-pay

## 2021-11-07 ENCOUNTER — Ambulatory Visit: Payer: Medicare HMO | Admitting: Physician Assistant

## 2021-11-07 DIAGNOSIS — Z85820 Personal history of malignant melanoma of skin: Secondary | ICD-10-CM

## 2021-11-07 DIAGNOSIS — Z85828 Personal history of other malignant neoplasm of skin: Secondary | ICD-10-CM | POA: Diagnosis not present

## 2021-11-07 DIAGNOSIS — Z1283 Encounter for screening for malignant neoplasm of skin: Secondary | ICD-10-CM

## 2021-11-07 DIAGNOSIS — D485 Neoplasm of uncertain behavior of skin: Secondary | ICD-10-CM

## 2021-11-07 DIAGNOSIS — L570 Actinic keratosis: Secondary | ICD-10-CM | POA: Diagnosis not present

## 2021-11-07 DIAGNOSIS — C44719 Basal cell carcinoma of skin of left lower limb, including hip: Secondary | ICD-10-CM

## 2021-11-07 DIAGNOSIS — D0472 Carcinoma in situ of skin of left lower limb, including hip: Secondary | ICD-10-CM

## 2021-11-07 MED ORDER — TOLAK 4 % EX CREA: TOPICAL_CREAM | CUTANEOUS | 0 refills | Status: DC

## 2021-11-07 NOTE — Patient Instructions (Signed)

## 2021-11-07 NOTE — Progress Notes (Signed)
Follow-Up Visit   Subjective  Jeffrey [name] is a 71 y.o. male who presents for the following: Annual Exam (Patient here today for skin check. Per patient he has a lesion on left inner cheek x 6 months no bleeding, no pain. Per patient he has a lesion on left lower leg x 1 year redness, and crusty, no bleeding. Check lesion on his forehead x 6 months no bleeding just crusty. Check lesion on chest x 1 year redness per patient he had surgery years ago and he has staples in his abdomen and he was told that the stables may start coming through the skin. Personal history of melanoma and non mole skin cancer.).   The following portions of the chart were reviewed this encounter and updated as appropriate:  Tobacco   Allergies   Meds   Problems   Med Hx   Surg Hx   Fam Hx       Objective  Well appearing patient in no apparent distress; mood and affect are within normal limits.  All skin waist up examined and lower legs.   Left Upper Back, Right Upper Back, right templex1 mid forehead x2 nose x 2 left cheek x1 (6) Erythematous patches with gritty scale.  Left Lower Leg - Anterior Scaly erythematous macule       Assessment & Plan  AK (actinic keratosis) (8) Left Upper Back; Right Upper Back; right templex1 mid forehead x2 nose x 2 left cheek x1 (6)  Destruction of lesion - Left Upper Back, Right Upper Back, right templex1 mid forehead x2 nose x 2 left cheek x1 Complexity: simple   Destruction method: cryotherapy   Informed consent: discussed and consent obtained   Timeout:  patient name, date of birth, surgical site, and procedure verified Lesion destroyed using liquid nitrogen: Yes   Cryotherapy cycles:  1 Outcome: patient tolerated procedure well with no complications   Post-procedure details: wound care instructions given    Related Medications Fluorouracil (TOLAK) 4 % CREA Apply nightly x 2 weeks to face (sun protect)  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 Left Lower Leg - Anterior  Skin / nail biopsy Type of biopsy: tangential   Informed consent: discussed and consent obtained   Timeout: patient name, date of birth, surgical site, and procedure verified   Procedure prep:  Patient was prepped and draped in usual sterile fashion (Non sterile) Prep type:  Chlorhexidine Anesthesia: the lesion was anesthetized in a standard fashion   Anesthetic:  1% lidocaine w/ epinephrine 1-100,000 local infiltration Instrument used: flexible razor blade   Outcome: patient tolerated procedure well   Post-procedure details: wound care instructions given    Destruction of lesion Complexity: simple   Destruction method: electrodesiccation and curettage   Informed consent: discussed and consent obtained   Timeout:  patient name, date of birth, surgical site, and procedure verified Anesthesia: the lesion was anesthetized in a standard fashion   Anesthetic:  1% lidocaine w/ epinephrine 1-100,000 local infiltration Curettage performed in three different directions: Yes   Electrodesiccation performed over the curetted area: Yes   Curettage cycles:  3 Margin per side (cm):  0.1 Final wound size (cm):  1.5 Hemostasis achieved with:  aluminum chloride Outcome: patient tolerated procedure well with no complications   Post-procedure details: wound care instructions given    Specimen 1 - Surgical pathology Differential Diagnosis: bcc scc tx with bx   Check Margins: No    I, Merle Cirelli, PA-C,  have reviewed all documentation's for this visit.  The documentation on 11/11/21 for the exam, diagnosis, procedures and orders are all accurate and complete.

## 2021-11-12 ENCOUNTER — Telehealth: Payer: Self-pay

## 2021-11-12 NOTE — Telephone Encounter (Signed)
-----   Message from Warren Danes, [place] sent at 11/11/2021  4:28 PM EST ----- Treated with biopsy. Not a dangerous one. Caught it early.

## 2021-11-12 NOTE — Telephone Encounter (Signed)
Phone call to patient with his pathology results. Voicemail left for patient to give the office a call back.  ?

## 2021-11-12 NOTE — Telephone Encounter (Signed)
Path to patient  

## 2021-11-22 DIAGNOSIS — J0100 Acute maxillary sinusitis, unspecified: Secondary | ICD-10-CM | POA: Diagnosis not present

## 2021-11-27 ENCOUNTER — Inpatient Hospital Stay (HOSPITAL_COMMUNITY): Payer: Medicare HMO | Attending: Hematology

## 2021-11-27 ENCOUNTER — Other Ambulatory Visit: Payer: Self-pay

## 2021-11-27 DIAGNOSIS — E7800 Pure hypercholesterolemia, unspecified: Secondary | ICD-10-CM | POA: Diagnosis not present

## 2021-11-27 DIAGNOSIS — Z7982 Long term (current) use of aspirin: Secondary | ICD-10-CM | POA: Diagnosis not present

## 2021-11-27 DIAGNOSIS — E8809 Other disorders of plasma-protein metabolism, not elsewhere classified: Secondary | ICD-10-CM

## 2021-11-27 DIAGNOSIS — Z8052 Family history of malignant neoplasm of bladder: Secondary | ICD-10-CM | POA: Insufficient documentation

## 2021-11-27 DIAGNOSIS — K219 Gastro-esophageal reflux disease without esophagitis: Secondary | ICD-10-CM | POA: Diagnosis not present

## 2021-11-27 DIAGNOSIS — D472 Monoclonal gammopathy: Secondary | ICD-10-CM | POA: Insufficient documentation

## 2021-11-27 DIAGNOSIS — Z79899 Other long term (current) drug therapy: Secondary | ICD-10-CM | POA: Insufficient documentation

## 2021-11-27 DIAGNOSIS — Z85828 Personal history of other malignant neoplasm of skin: Secondary | ICD-10-CM | POA: Insufficient documentation

## 2021-11-27 DIAGNOSIS — Z803 Family history of malignant neoplasm of breast: Secondary | ICD-10-CM | POA: Diagnosis not present

## 2021-11-27 LAB — CBC WITH DIFFERENTIAL/PLATELET
Abs Immature Granulocytes: 0.01 10*3/uL (ref 0.00–0.07)
Basophils Absolute: 0 10*3/uL (ref 0.0–0.1)
Basophils Relative: 1 %
Eosinophils Absolute: 0.1 10*3/uL (ref 0.0–0.5)
Eosinophils Relative: 2 %
HCT: 39.7 % (ref 39.0–52.0)
Hemoglobin: 13.8 g/dL (ref 13.0–17.0)
Immature Granulocytes: 0 %
Lymphocytes Relative: 34 %
Lymphs Abs: 1.8 10*3/uL (ref 0.7–4.0)
MCH: 34.4 pg — ABNORMAL HIGH (ref 26.0–34.0)
MCHC: 34.8 g/dL (ref 30.0–36.0)
MCV: 99 fL (ref 80.0–100.0)
Monocytes Absolute: 0.4 10*3/uL (ref 0.1–1.0)
Monocytes Relative: 7 %
Neutro Abs: 3 10*3/uL (ref 1.7–7.7)
Neutrophils Relative %: 56 %
Platelets: 171 10*3/uL (ref 150–400)
RBC: 4.01 MIL/uL — ABNORMAL LOW (ref 4.22–5.81)
RDW: 12.8 % (ref 11.5–15.5)
WBC: 5.4 10*3/uL (ref 4.0–10.5)
nRBC: 0 % (ref 0.0–0.2)

## 2021-11-27 LAB — COMPREHENSIVE METABOLIC PANEL
ALT: 34 U/L (ref 0–44)
AST: 28 U/L (ref 15–41)
Albumin: 4.2 g/dL (ref 3.5–5.0)
Alkaline Phosphatase: 49 U/L (ref 38–126)
Anion gap: 7 (ref 5–15)
BUN: 16 mg/dL (ref 8–23)
CO2: 26 mmol/L (ref 22–32)
Calcium: 9.4 mg/dL (ref 8.9–10.3)
Chloride: 106 mmol/L (ref 98–111)
Creatinine, Ser: 0.8 mg/dL (ref 0.61–1.24)
GFR, Estimated: >60 mL/min (ref >60)
Glucose, Bld: 115 mg/dL — ABNORMAL HIGH (ref 70–99)
Potassium: 4.7 mmol/L (ref 3.5–5.1)
Sodium: 139 mmol/L (ref 135–145)
Total Bilirubin: 0.8 mg/dL (ref 0.3–1.2)
Total Protein: 7.3 g/dL (ref 6.5–8.1)

## 2021-11-27 LAB — LACTATE DEHYDROGENASE: LDH: 162 U/L (ref 98–192)

## 2021-11-28 LAB — KAPPA/LAMBDA LIGHT CHAINS
Kappa free light chain: 29.7 mg/L — ABNORMAL HIGH (ref 3.3–19.4)
Kappa, lambda light chain ratio: 1.46 (ref 0.26–1.65)
Lambda free light chains: 20.4 mg/L (ref 5.7–26.3)

## 2021-11-29 LAB — PROTEIN ELECTROPHORESIS, SERUM
A/G Ratio: 1.3 (ref 0.7–1.7)
Albumin ELP: 4.1 g/dL (ref 2.9–4.4)
Alpha-1-Globulin: 0.2 g/dL (ref 0.0–0.4)
Alpha-2-Globulin: 0.7 g/dL (ref 0.4–1.0)
Beta Globulin: 1 g/dL (ref 0.7–1.3)
Gamma Globulin: 1.1 g/dL (ref 0.4–1.8)
Globulin, Total: 3.1 g/dL (ref 2.2–3.9)
M-Spike, %: 0.3 g/dL — ABNORMAL HIGH
Total Protein ELP: 7.2 g/dL (ref 6.0–8.5)

## 2021-12-04 ENCOUNTER — Inpatient Hospital Stay (HOSPITAL_BASED_OUTPATIENT_CLINIC_OR_DEPARTMENT_OTHER): Payer: Medicare HMO | Admitting: Hematology

## 2021-12-04 ENCOUNTER — Other Ambulatory Visit: Payer: Self-pay

## 2021-12-04 VITALS — BP 134/81 | HR 66 | Temp 96.8°F | Resp 18 | Ht 68.0 in | Wt 193.7 lb

## 2021-12-04 DIAGNOSIS — E7800 Pure hypercholesterolemia, unspecified: Secondary | ICD-10-CM | POA: Diagnosis not present

## 2021-12-04 DIAGNOSIS — E8809 Other disorders of plasma-protein metabolism, not elsewhere classified: Secondary | ICD-10-CM

## 2021-12-04 DIAGNOSIS — Z803 Family history of malignant neoplasm of breast: Secondary | ICD-10-CM | POA: Diagnosis not present

## 2021-12-04 DIAGNOSIS — Z85828 Personal history of other malignant neoplasm of skin: Secondary | ICD-10-CM | POA: Diagnosis not present

## 2021-12-04 DIAGNOSIS — Z7982 Long term (current) use of aspirin: Secondary | ICD-10-CM | POA: Diagnosis not present

## 2021-12-04 DIAGNOSIS — Z79899 Other long term (current) drug therapy: Secondary | ICD-10-CM | POA: Diagnosis not present

## 2021-12-04 DIAGNOSIS — Z8052 Family history of malignant neoplasm of bladder: Secondary | ICD-10-CM | POA: Diagnosis not present

## 2021-12-04 DIAGNOSIS — D472 Monoclonal gammopathy: Secondary | ICD-10-CM | POA: Diagnosis not present

## 2021-12-04 DIAGNOSIS — K219 Gastro-esophageal reflux disease without esophagitis: Secondary | ICD-10-CM | POA: Diagnosis not present

## 2021-12-04 NOTE — Patient Instructions (Addendum)
[place] at [facility] Discharge Instructions   You were seen and examined today by Dr. Delton Coombes.  He reviewed the results of your lab work which is normal/stable.  Return as scheduled in 6 months with lab work and x-ray.     Thank you for choosing [place] at [facility] to provide your oncology and hematology care.  To afford each patient quality time with our provider, please arrive at least 15 minutes before your scheduled appointment time.   If you have a lab appointment with the [facility] please come in thru the Main Entrance and check in at the main information desk.  You need to re-schedule your appointment should you arrive 10 or more minutes late.  We strive to give you quality time with our providers, and arriving late affects you and other patients whose appointments are after yours.  Also, if you no show three or more times for appointments you may be dismissed from the clinic at the providers discretion.     Again, thank you for choosing [facility].  Our hope is that these requests will decrease the amount of time that you wait before being seen by our physicians.       _____________________________________________________________  Should you have questions after your visit to [facility], please contact our office at [phone] and follow the prompts.  Our office hours are 8:00 a.m. and 4:30 p.m. Monday - Friday.  Please note that voicemails left after 4:00 p.m. may not be returned until the following business day.  We are closed weekends and major holidays.  You do have access to a nurse 24-7, just call the main number to the clinic [phone] and do not press any options, hold on the line and a nurse will answer the phone.    For prescription refill requests, have your pharmacy contact our office and allow 72 hours.    Due to Covid, you will need to wear a mask upon entering the  hospital. If you do not have a mask, a mask will be given to you at the Main Entrance upon arrival. For doctor visits, patients may have 1 support person age 82 or older with them. For treatment visits, patients can not have anyone with them due to social distancing guidelines and our immunocompromised population.

## 2021-12-04 NOTE — Progress Notes (Signed)
Jeffrey [name], Jeffrey [name] [id number]   CLINIC:  Medical Oncology/Hematology  PCP:  Jeffrey Noble, MD [address] / [place] [address]  [phone]  REASON FOR VISIT:  Follow-up for plasma cell disorder  PRIOR THERAPY: none  CURRENT THERAPY: surveillance  INTERVAL HISTORY:  Jeffrey [name], a 71 y.o. male, returns for routine follow-up for his plasma cell disorder. Jeffrey [name] was last seen on 07/29/2021.  Today he reports feeling good. He reports 1 sinus infection since his last visit for which he completed a course of antibiotics 1 week ago. He denies cough.   REVIEW OF SYSTEMS:  Review of Systems  Constitutional:  Negative for appetite change and fatigue.  Respiratory:  Negative for cough.   Neurological:  Positive for numbness.  Psychiatric/Behavioral:  Positive for sleep disturbance.   All other systems reviewed and are negative.  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/09/2014   Left Ear Rim (tx p bx)   Nodular basal cell carcinoma (BCC) 12/01/2019   Left Post Auriculaar (tx p bx)   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/2014   Right Forehead (in situ) (curet and 5FU)   SCCA (squamous cell carcinoma) of skin 11/04/2011   Left Outer Eye (curet and 5FU)   Squamous cell carcinoma of skin 03/25/2006   Right Outer Eyebrow (in situ)   Superficial  basal cell carcinoma (BCC) 02/07/2014   Right Ear (curet and 5FU)   Superficial nodular basal cell carcinoma 09/25/2015   Right Inner Cheek (curet and 5FU)   Ventral hernia    Wears glasses    Past Surgical History:  Procedure Laterality Date   BACK SURGERY  12/03/2015   Lumbar Fusion Lumber 2-3, 3-4,4-5   BOWEL RESECTION  03/30/2012   Procedure: SMALL BOWEL RESECTION;  Surgeon: Donato Heinz, MD;  Location: [facility];  Service: General;  Laterality: N/A;   COLONOSCOPY N/A 01/10/2014   Procedure: COLONOSCOPY;  Surgeon: Daneil Dolin, MD;  Location: [facility];  Service: Endoscopy;  Laterality: N/A;  1:30 PM   COLONOSCOPY N/A 11/03/2018   Procedure: COLONOSCOPY;  Surgeon: Daneil Dolin, MD;  Location: [facility];  Service: Endoscopy;  Laterality: N/A;  7:30   CYSTECTOMY     right thumb 2o yrs   CYSTECTOMY     HERNIA REPAIR     umbilical hernia [id number] yrs ago   [place]  10/22/2012   Procedure: LAPAROSCOPIC INCISIONAL HERNIA;  Surgeon: Adin Hector, MD;  Location: [facility];  Service: General;  Laterality: N/A;  incarcerated   INSERTION OF MESH  10/22/2012   Procedure: INSERTION OF MESH;  Surgeon: Adin Hector, MD;  Location: [facility];  Service: General;  Laterality: N/A;   KNEE ARTHROSCOPY     with bone removal-right leg- 2 yrs ago  LAPAROTOMY  03/30/2012   Procedure: EXPLORATORY LAPAROTOMY;  Surgeon: Donato Heinz, MD;  Location: [facility];  Service: General;  Laterality: N/A;   MASS EXCISION  12/11/2011   Procedure: EXCISION MASS;  Surgeon: Marcheta Grammes, DPM;  Location: [facility];  Service: Orthopedics;  Laterality: Left;  Excision of plantar fibroma   REPAIR OF PERONEUS BREVIS TENDON Left 03/09/2014   Procedure: LEFT PERONEUS BREVIS LONGUS TENOLYSIS AND TENDON REPAIR ;  Surgeon: Wylene Simmer, MD;  Location: [facility];  Service: Orthopedics;  Laterality: Left;   WOUND EXPLORATION N/A 02/03/2017   Procedure: EXPLORE ABDOMINAL WOUND, INJECT  ABSOLUTE ALCOHOL;  Surgeon: Fanny Skates, MD;  Location: [facility];  Service: General;  Laterality: N/A;    SOCIAL HISTORY:  Social History   Socioeconomic History   Marital status: Married    Spouse name: Not on file   Number of children: 2   Years of education: college   Highest education level: Not on file  Occupational History   Occupation: Retired   Occupation: Likes to build cars.  Tobacco Use   Smoking status: Never   Smokeless tobacco: Never  Vaping Use   Vaping Use: Never used  Substance and Sexual Activity   Alcohol use: Not Currently   Drug use: No   Sexual activity: Yes  Other Topics Concern   Not on file  Social History Narrative   Lives at home with his wife.   Right-handed.   Two cups caffeine per day.   Social Determinants of Health   Financial Resource Strain: Low Risk    Difficulty of Paying Living Expenses: Not hard at all  Food Insecurity: No Food Insecurity   Worried About Charity fundraiser in the Last Year: Never true   [place] in the Last Year: Never true  Transportation Needs: No Transportation Needs   Lack of Transportation (Medical): No   Lack of Transportation (Non-Medical): No  Physical Activity: Inactive   Days of Exercise per Week: 7 days   Minutes of Exercise per Session: 0 min  Stress: No Stress Concern Present   Feeling of Stress : Not at all  Social Connections: Moderately Integrated   Frequency of Communication with Friends and Family: More than three times a week   Frequency of Social Gatherings with Friends and Family: More than three times a week   Attends Religious Services: More than 4 times per year   Active Member of [employer] or Organizations: No   Attends Music therapist: Never   Marital Status: Married  Human resources officer Violence: Not At Risk   Fear of Current or Ex-Partner: No   Emotionally Abused: No   Physically Abused: No   Sexually Abused: No    FAMILY HISTORY:  Family History  Problem  Relation Age of Onset   Stroke Mother    Breast cancer Mother    Dementia Father    Heart disease Father    Bladder Cancer Father    Diabetes Father    Diabetes Brother    Diabetes Brother    Diabetes Brother    Diabetes Brother    Anesthesia problems Neg Hx    Hypotension Neg Hx    Malignant hyperthermia Neg Hx    Pseudochol deficiency Neg Hx     CURRENT MEDICATIONS:  Current Outpatient Medications  Medication Sig Dispense Refill   aspirin EC 81 MG tablet Take 81 mg by mouth at bedtime.     atorvastatin (LIPITOR)  20 MG tablet Take 1 tablet by mouth daily.     FLUAD QUADRIVALENT 0.5 ML injection      Fluorouracil (TOLAK) 4 % CREA Apply nightly x 2 weeks to face (sun protect) 40 g 0   ketoconazole (NIZORAL) 2 % cream Apply topically 2 (two) times daily.     tadalafil (CIALIS) 20 MG tablet Take 20 mg by mouth as needed for erectile dysfunction.     terbinafine (LAMISIL) 250 MG tablet Take 250 mg by mouth daily.     No current facility-administered medications for this visit.    ALLERGIES:  No Known Allergies  PHYSICAL EXAM:  Performance status (ECOG): 1 - Symptomatic but completely ambulatory  Vitals:   12/04/21 0752  BP: 134/81  Pulse: 66  Resp: 18  Temp: (!) 96.8 F (36 C)  SpO2: 98%   Wt Readings from Last 3 Encounters:  12/04/21 193 lb 11.2 oz (87.9 kg)  07/29/21 184 lb 14.4 oz (83.9 kg)  07/17/21 184 lb (83.5 kg)   Physical Exam Vitals reviewed.  Constitutional:      Appearance: Normal appearance.  Cardiovascular:     Rate and Rhythm: Normal rate and regular rhythm.     Pulses: Normal pulses.     Heart sounds: Normal heart sounds.  Pulmonary:     Effort: Pulmonary effort is normal.     Breath sounds: Normal breath sounds.  Neurological:     General: No focal deficit present.     Mental Status: He is alert and oriented to person, place, and time.  Psychiatric:        Mood and Affect: Mood normal.        Behavior: Behavior normal.    LABORATORY  DATA:  I have reviewed the labs as listed.  CBC Latest Ref Rng & Units 11/27/2021 09/05/2019 02/03/2017  WBC 4.0 - 10.5 K/uL 5.4 5.2 6.2  Hemoglobin 13.0 - 17.0 g/dL 13.8 13.4 14.4  Hematocrit 39.0 - 52.0 % 39.7 38.8 41.5  Platelets 150 - 400 K/uL 171 166 138(L)   CMP Latest Ref Rng & Units 11/27/2021 06/26/2021 09/05/2019  Glucose 70 - 99 mg/dL 115(H) 114(H) 100(H)  BUN 8 - 23 mg/dL 16 12 10   Creatinine 0.61 - 1.24 mg/dL 0.80 0.80 0.84  Sodium 135 - 145 mmol/L 139 140 141  Potassium 3.5 - 5.1 mmol/L 4.7 4.6 4.6  Chloride 98 - 111 mmol/L 106 107 103  CO2 22 - 32 mmol/L 26 27 23   Calcium 8.9 - 10.3 mg/dL 9.4 9.3 9.8  Total Protein 6.5 - 8.1 g/dL 7.3 7.4 7.2  Total Bilirubin 0.3 - 1.2 mg/dL 0.8 1.1 0.6  Alkaline Phos 38 - 126 U/L 49 48 59  AST 15 - 41 U/L 28 27 24   ALT 0 - 44 U/L 34 27 27      Component Value Date/Time   RBC 4.01 (L) 11/27/2021 0829   MCV 99.0 11/27/2021 0829   MCV 94 09/05/2019 1221   MCH 34.4 (H) 11/27/2021 0829   MCHC 34.8 11/27/2021 0829   RDW 12.8 11/27/2021 0829   RDW 12.1 09/05/2019 1221   LYMPHSABS 1.8 11/27/2021 0829   LYMPHSABS 1.5 09/05/2019 1221   MONOABS 0.4 11/27/2021 0829   EOSABS 0.1 11/27/2021 0829   EOSABS 0.1 09/05/2019 1221   BASOSABS 0.0 11/27/2021 0829   BASOSABS 0.0 09/05/2019 1221    DIAGNOSTIC IMAGING:  I have independently reviewed the scans and discussed with the patient. No results found.   ASSESSMENT:  Plasma cell disorder: - Seen at the request of Dr. Willey Blade.  Labs on 06/06/2021 at his office showed normal CBC with hemoglobin 13.3.  SPEP showed M spike of 0.3 g.  Immunofixation shows IgG kappa. - LDH and beta-2 microglobulin normal. - He reports history of melanoma of skin resected 20 years ago. - He had abdominal surgery and had infections.  Denies any new onset bone pains.  He reports occasional tingling in the legs and feet from back surgery. - 24-hour urine was negative for M spike on immunofixation. - Skeletal survey on  06/26/2021 showed 3 lucent lesions in the calvarium. - PET scan on 07/25/2021 with no evidence of metabolically active myeloma.  2.  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  Non-smoker. - Brother had non-Hodgkin's lymphoma.  Mother had breast cancer.  Dad had bladder cancer   PLAN:  1.  IgG kappa MGUS: - He does not report any new onset bone pains.  No recent infections. - Reviewed labs from 11/27/2021.  M spike is stable at 0.3 g.  Hemoglobin was normal.  Creatinine and calcium were normal. - Kappa light chains are mildly elevated at 29.7 with normal ratio. - We discussed normal pathophysiology of MGUS with 1% risk of transformation to myeloma per year. - Recommend follow-up in 6 months with repeat labs and skeletal survey.  Orders placed this encounter:  No orders of the defined types were placed in this encounter.    Derek Jack, MD [place] [phone]   I, Thana Ates, am acting as a scribe for Dr. Derek Jack.  I, Derek Jack MD, have reviewed the above documentation for accuracy and completeness, and I agree with the above.

## 2021-12-27 DIAGNOSIS — M4316 Spondylolisthesis, lumbar region: Secondary | ICD-10-CM | POA: Diagnosis not present

## 2022-03-31 DIAGNOSIS — Z0100 Encounter for examination of eyes and vision without abnormal findings: Secondary | ICD-10-CM | POA: Diagnosis not present

## 2022-03-31 DIAGNOSIS — H5200 Hypermetropia, unspecified eye: Secondary | ICD-10-CM | POA: Diagnosis not present

## 2022-05-28 ENCOUNTER — Inpatient Hospital Stay: Payer: Medicare HMO | Attending: Hematology

## 2022-05-28 ENCOUNTER — Ambulatory Visit (HOSPITAL_COMMUNITY)
Admission: RE | Admit: 2022-05-28 | Discharge: 2022-05-28 | Disposition: A | Discharge status: Home / Self Care | Payer: Medicare HMO | Source: Ambulatory Visit | Attending: Hematology | Admitting: Hematology

## 2022-05-28 DIAGNOSIS — Z807 Family history of other malignant neoplasms of lymphoid, hematopoietic and related tissues: Secondary | ICD-10-CM | POA: Diagnosis not present

## 2022-05-28 DIAGNOSIS — E8809 Other disorders of plasma-protein metabolism, not elsewhere classified: Secondary | ICD-10-CM

## 2022-05-28 DIAGNOSIS — K219 Gastro-esophageal reflux disease without esophagitis: Secondary | ICD-10-CM | POA: Insufficient documentation

## 2022-05-28 DIAGNOSIS — Z803 Family history of malignant neoplasm of breast: Secondary | ICD-10-CM | POA: Diagnosis not present

## 2022-05-28 DIAGNOSIS — D472 Monoclonal gammopathy: Secondary | ICD-10-CM | POA: Insufficient documentation

## 2022-05-28 DIAGNOSIS — Z85828 Personal history of other malignant neoplasm of skin: Secondary | ICD-10-CM | POA: Insufficient documentation

## 2022-05-28 DIAGNOSIS — Z8052 Family history of malignant neoplasm of bladder: Secondary | ICD-10-CM | POA: Diagnosis not present

## 2022-05-28 DIAGNOSIS — K5909 Other constipation: Secondary | ICD-10-CM | POA: Diagnosis not present

## 2022-05-28 LAB — COMPREHENSIVE METABOLIC PANEL
ALT: 27 U/L (ref 0–44)
AST: 25 U/L (ref 15–41)
Albumin: 4.3 g/dL (ref 3.5–5.0)
Alkaline Phosphatase: 50 U/L (ref 38–126)
Anion gap: 6 (ref 5–15)
BUN: 17 mg/dL (ref 8–23)
CO2: 25 mmol/L (ref 22–32)
Calcium: 9.4 mg/dL (ref 8.9–10.3)
Chloride: 107 mmol/L (ref 98–111)
Creatinine, Ser: 0.8 mg/dL (ref 0.61–1.24)
GFR, Estimated: >60 mL/min (ref >60)
Glucose, Bld: 111 mg/dL — ABNORMAL HIGH (ref 70–99)
Potassium: 4.5 mmol/L (ref 3.5–5.1)
Sodium: 138 mmol/L (ref 135–145)
Total Bilirubin: 1 mg/dL (ref 0.3–1.2)
Total Protein: 7.7 g/dL (ref 6.5–8.1)

## 2022-05-28 LAB — CBC WITH DIFFERENTIAL/PLATELET
Abs Immature Granulocytes: 0.02 10*3/uL (ref 0.00–0.07)
Basophils Absolute: 0 10*3/uL (ref 0.0–0.1)
Basophils Relative: 1 %
Eosinophils Absolute: 0.1 10*3/uL (ref 0.0–0.5)
Eosinophils Relative: 2 %
HCT: 40.2 % (ref 39.0–52.0)
Hemoglobin: 13.4 g/dL (ref 13.0–17.0)
Immature Granulocytes: 0 %
Lymphocytes Relative: 29 %
Lymphs Abs: 1.5 10*3/uL (ref 0.7–4.0)
MCH: 32.3 pg (ref 26.0–34.0)
MCHC: 33.3 g/dL (ref 30.0–36.0)
MCV: 96.9 fL (ref 80.0–100.0)
Monocytes Absolute: 0.3 10*3/uL (ref 0.1–1.0)
Monocytes Relative: 6 %
Neutro Abs: 3.2 10*3/uL (ref 1.7–7.7)
Neutrophils Relative %: 62 %
Platelets: 151 10*3/uL (ref 150–400)
RBC: 4.15 MIL/uL — ABNORMAL LOW (ref 4.22–5.81)
RDW: 12.8 % (ref 11.5–15.5)
WBC: 5.2 10*3/uL (ref 4.0–10.5)
nRBC: 0 % (ref 0.0–0.2)

## 2022-05-28 LAB — LACTATE DEHYDROGENASE: LDH: 142 U/L (ref 98–192)

## 2022-05-29 LAB — KAPPA/LAMBDA LIGHT CHAINS
Kappa free light chain: 28.2 mg/L — ABNORMAL HIGH (ref 3.3–19.4)
Kappa, lambda light chain ratio: 1.49 (ref 0.26–1.65)
Lambda free light chains: 18.9 mg/L (ref 5.7–26.3)

## 2022-05-30 LAB — PROTEIN ELECTROPHORESIS, SERUM
A/G Ratio: 1.3 (ref 0.7–1.7)
Albumin ELP: 3.8 g/dL (ref 2.9–4.4)
Alpha-1-Globulin: 0.2 g/dL (ref 0.0–0.4)
Alpha-2-Globulin: 0.6 g/dL (ref 0.4–1.0)
Beta Globulin: 0.9 g/dL (ref 0.7–1.3)
Gamma Globulin: 1.1 g/dL (ref 0.4–1.8)
Globulin, Total: 2.9 g/dL (ref 2.2–3.9)
M-Spike, %: 0.2 g/dL — ABNORMAL HIGH
Total Protein ELP: 6.7 g/dL (ref 6.0–8.5)

## 2022-06-04 ENCOUNTER — Inpatient Hospital Stay (HOSPITAL_BASED_OUTPATIENT_CLINIC_OR_DEPARTMENT_OTHER): Payer: Medicare HMO | Admitting: Hematology

## 2022-06-04 VITALS — BP 161/81 | HR 70 | Temp 98.0°F | Resp 16 | Wt 192.5 lb

## 2022-06-04 DIAGNOSIS — K219 Gastro-esophageal reflux disease without esophagitis: Secondary | ICD-10-CM | POA: Diagnosis not present

## 2022-06-04 DIAGNOSIS — Z807 Family history of other malignant neoplasms of lymphoid, hematopoietic and related tissues: Secondary | ICD-10-CM | POA: Diagnosis not present

## 2022-06-04 DIAGNOSIS — Z8052 Family history of malignant neoplasm of bladder: Secondary | ICD-10-CM | POA: Diagnosis not present

## 2022-06-04 DIAGNOSIS — E8809 Other disorders of plasma-protein metabolism, not elsewhere classified: Secondary | ICD-10-CM | POA: Diagnosis not present

## 2022-06-04 DIAGNOSIS — Z85828 Personal history of other malignant neoplasm of skin: Secondary | ICD-10-CM | POA: Diagnosis not present

## 2022-06-04 DIAGNOSIS — Z803 Family history of malignant neoplasm of breast: Secondary | ICD-10-CM | POA: Diagnosis not present

## 2022-06-04 DIAGNOSIS — D472 Monoclonal gammopathy: Secondary | ICD-10-CM | POA: Diagnosis not present

## 2022-06-04 DIAGNOSIS — K5909 Other constipation: Secondary | ICD-10-CM | POA: Diagnosis not present

## 2022-06-04 NOTE — Patient Instructions (Signed)
[place] at [facility] Discharge Instructions   You were seen and examined today by Dr. Delton Coombes.  He reviewed your lab work today which is normal/stable.   He reviewed the results of your skeletal survey which is stable - no changes.   Return as scheduled in 6 months. We will repeat blood work prior to next visit.    Thank you for choosing [place] at [facility] to provide your oncology and hematology care.  To afford each patient quality time with our provider, please arrive at least 15 minutes before your scheduled appointment time.   If you have a lab appointment with the [facility] please come in thru the Main Entrance and check in at the main information desk.  You need to re-schedule your appointment should you arrive 10 or more minutes late.  We strive to give you quality time with our providers, and arriving late affects you and other patients whose appointments are after yours.  Also, if you no show three or more times for appointments you may be dismissed from the clinic at the providers discretion.     Again, thank you for choosing [facility].  Our hope is that these requests will decrease the amount of time that you wait before being seen by our physicians.       _____________________________________________________________  Should you have questions after your visit to [facility], please contact our office at [phone] and follow the prompts.  Our office hours are 8:00 a.m. and 4:30 p.m. Monday - Friday.  Please note that voicemails left after 4:00 p.m. may not be returned until the following business day.  We are closed weekends and major holidays.  You do have access to a nurse 24-7, just call the main number to the clinic [phone] and do not press any options, hold on the line and a nurse will answer the phone.    For prescription refill requests, have your pharmacy contact our office  and allow 72 hours.    Due to Covid, you will need to wear a mask upon entering the hospital. If you do not have a mask, a mask will be given to you at the Main Entrance upon arrival. For doctor visits, patients may have 1 support person age 37 or older with them. For treatment visits, patients can not have anyone with them due to social distancing guidelines and our immunocompromised population.

## 2022-06-04 NOTE — Progress Notes (Signed)
[place], [place] [address]   CLINIC:  Medical Oncology/Hematology  PCP:  Asencion Noble, MD [address] / [place] [address]  [phone]  REASON FOR VISIT:  Follow-up for plasma cell disorder  PRIOR THERAPY: none  CURRENT THERAPY: surveillance  INTERVAL HISTORY:  Jeffrey [name], a 71 y.o. male, seen for follow-up of MGUS.  He reports some tiredness with energy levels at 75%.  Numbness in the legs and feet has been stable.  He reports some occasional acid reflux.  Chronic constipation is also stable.  He had 1 episode of sinus infection in the last 6 months.  REVIEW OF SYSTEMS:  Review of Systems  Constitutional:  Negative for appetite change and fatigue.  Respiratory:  Negative for cough.   Gastrointestinal:  Positive for constipation.  Neurological:  Positive for dizziness and numbness.  Psychiatric/Behavioral:  Negative for sleep disturbance.   All other systems reviewed and are negative.   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/09/2014   Left Ear Rim (tx p bx)   Nodular basal cell carcinoma (BCC) 12/01/2019   Left Post Auriculaar (tx p bx)   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/2014   Right Forehead (in situ) (curet and 5FU)   SCCA (squamous cell carcinoma) of skin 11/04/2011   Left Outer Eye (curet and 5FU)   Squamous cell  carcinoma of skin 03/25/2006   Right Outer Eyebrow (in situ)   Superficial basal cell carcinoma (BCC) 02/07/2014   Right Ear (curet and 5FU)   Superficial nodular basal cell carcinoma 09/25/2015   Right Inner Cheek (curet and 5FU)   Ventral hernia    Wears glasses    Past Surgical History:  Procedure Laterality Date   BACK SURGERY  12/03/2015   Lumbar Fusion Lumber 2-3, 3-4,4-5   BOWEL RESECTION  03/30/2012   Procedure: SMALL BOWEL RESECTION;  Surgeon: Donato Heinz, MD;  Location: [facility];  Service: General;  Laterality: N/A;   COLONOSCOPY N/A 01/10/2014   Procedure: COLONOSCOPY;  Surgeon: Daneil Dolin, MD;  [facility];  Service: Endoscopy;  Laterality: N/A;  1:30 PM   COLONOSCOPY N/A 11/03/2018   Procedure: COLONOSCOPY;  Surgeon: Daneil Dolin, MD;  [facility];  Service: Endoscopy;  Laterality: N/A;  7:30   CYSTECTOMY     right thumb 2o yrs   CYSTECTOMY     HERNIA REPAIR     umbilical hernia [id number] yrs ago   [place]  10/22/2012   Procedure: LAPAROSCOPIC INCISIONAL HERNIA;  Surgeon: Adin Hector, MD;  [facility];  Service: General;  Laterality: N/A;  incarcerated   INSERTION OF MESH  10/22/2012   Procedure: INSERTION OF MESH;  Surgeon: Adin Hector, MD;  [facility]: [facility];  Service: General;  Laterality: N/A;  KNEE ARTHROSCOPY     with bone removal-right leg- 2 yrs ago   LAPAROTOMY  03/30/2012   Procedure: EXPLORATORY LAPAROTOMY;  Surgeon: Donato Heinz, MD;  Location: [facility];  Service: General;  Laterality: N/A;   MASS EXCISION  12/11/2011   Procedure: EXCISION MASS;  Surgeon: Marcheta Grammes, DPM;  Location: [facility];  Service: Orthopedics;  Laterality: Left;  Excision of plantar fibroma   REPAIR OF PERONEUS BREVIS TENDON Left 03/09/2014   Procedure: LEFT PERONEUS BREVIS LONGUS TENOLYSIS AND TENDON REPAIR ;  Surgeon: Wylene Simmer, MD;  Location: [facility];  Service: Orthopedics;  Laterality: Left;    WOUND EXPLORATION N/A 02/03/2017   Procedure: EXPLORE ABDOMINAL WOUND, INJECT ABSOLUTE ALCOHOL;  Surgeon: Fanny Skates, MD;  [facility];  Service: General;  Laterality: N/A;    SOCIAL HISTORY:  Social History   Socioeconomic History   Marital status: Married    Spouse name: Not on file   Number of children: 2   Years of education: college   Highest education level: Not on file  Occupational History   Occupation: Retired   Occupation: Likes to build cars.  Tobacco Use   Smoking status: Never   Smokeless tobacco: Never  Vaping Use   Vaping Use: Never used  Substance and Sexual Activity   Alcohol use: Not Currently   Drug use: No   Sexual activity: Yes  Other Topics Concern   Not on file  Social History Narrative   Lives at home with his wife.   Right-handed.   Two cups caffeine per day.   Social Determinants of Health   Financial Resource Strain: Low Risk  (06/26/2021)   Overall Financial Resource Strain (CARDIA)    Difficulty of Paying Living Expenses: Not hard at all  Food Insecurity: No Food Insecurity (06/26/2021)   Hunger Vital Sign    Worried About Running Out of Food in the Last Year: Never true    Ran Out of Food in the Last Year: Never true  Transportation Needs: No Transportation Needs (06/26/2021)   PRAPARE - Hydrologist (Medical): No    Lack of Transportation (Non-Medical): No  Physical Activity: Inactive (06/26/2021)   Exercise Vital Sign    Days of Exercise per Week: 7 days    Minutes of Exercise per Session: 0 min  Stress: No Stress Concern Present (06/26/2021)   [place]    Feeling of Stress : Not at all  Social Connections: Moderately Integrated (06/26/2021)   Social Connection and Isolation Panel [NHANES]    Frequency of Communication with Friends and Family: More than three times a week    Frequency of Social Gatherings with Friends and Family: More than  three times a week    Attends Religious Services: More than 4 times per year    Active Member of [employer] or Organizations: No    Attends Archivist Meetings: Never    Marital Status: Married  Human resources officer Violence: Not At Risk (06/26/2021)   Humiliation, Afraid, Rape, and Kick questionnaire    Fear of Current or Ex-Partner: No    Emotionally Abused: No    Physically Abused: No    Sexually Abused: No    FAMILY HISTORY:  Family History  Problem Relation Age of Onset   Stroke Mother    Breast cancer Mother    Dementia Father    Heart disease Father    Bladder  Cancer Father    Diabetes Father    Diabetes Brother    Diabetes Brother    Diabetes Brother    Diabetes Brother    Anesthesia problems Neg Hx    Hypotension Neg Hx    Malignant hyperthermia Neg Hx    Pseudochol deficiency Neg Hx     CURRENT MEDICATIONS:  Current Outpatient Medications  Medication Sig Dispense Refill   aspirin EC 81 MG tablet Take 81 mg by mouth at bedtime.     atorvastatin (LIPITOR) 20 MG tablet Take 1 tablet by mouth daily.     No current facility-administered medications for this visit.    ALLERGIES:  No Known Allergies  PHYSICAL EXAM:  Performance status (ECOG): 1 - Symptomatic but completely ambulatory  Vitals:   06/04/22 0815  BP: (!) 161/81  Pulse: 70  Resp: 16  Temp: 98 F (36.7 C)  SpO2: 96%   Wt Readings from Last 3 Encounters:  06/04/22 192 lb 8 oz (87.3 kg)  12/04/21 193 lb 11.2 oz (87.9 kg)  07/29/21 184 lb 14.4 oz (83.9 kg)   Physical Exam Vitals reviewed.  Constitutional:      Appearance: Normal appearance.  Cardiovascular:     Rate and Rhythm: Normal rate and regular rhythm.     Pulses: Normal pulses.     Heart sounds: Normal heart sounds.  Pulmonary:     Effort: Pulmonary effort is normal.     Breath sounds: Normal breath sounds.  Neurological:     General: No focal deficit present.     Mental Status: He is alert and oriented to person, place, and  time.  Psychiatric:        Mood and Affect: Mood normal.        Behavior: Behavior normal.    LABORATORY DATA:  I have reviewed the labs as listed.     Latest Ref Rng & Units 05/28/2022    9:37 AM 11/27/2021    8:29 AM 09/05/2019   12:21 PM  CBC  WBC 4.0 - 10.5 K/uL 5.2  5.4  5.2   Hemoglobin 13.0 - 17.0 g/dL 13.4  13.8  13.4   Hematocrit 39.0 - 52.0 % 40.2  39.7  38.8   Platelets 150 - 400 K/uL 151  171  166       Latest Ref Rng & Units 05/28/2022    9:37 AM 11/27/2021    8:29 AM 06/26/2021   11:53 AM  CMP  Glucose 70 - 99 mg/dL 111  115  114   BUN 8 - 23 mg/dL _0 Creatinine 0.61 - 1.24 mg/dL 0.80  0.80  0.80   Sodium 135 - 145 mmol/L 138  139  140   Potassium 3.5 - 5.1 mmol/L 4.5  4.7  4.6   Chloride 98 - 111 mmol/L 107  106  107   CO2 22 - 32 mmol/L _1 Calcium 8.9 - 10.3 mg/dL 9.4  9.4  9.3   Total Protein 6.5 - 8.1 g/dL 7.7  7.3  7.4   Total Bilirubin 0.3 - 1.2 mg/dL 1.0  0.8  1.1   Alkaline Phos 38 - 126 U/L 50  49  48   AST 15 - 41 U/L _2 ALT 0 - 44 U/L 27  34  27       Component Value Date/Time   RBC 4.15 (L) 05/28/2022 0937   MCV  96.9 05/28/2022 0937   MCV 94 09/05/2019 1221   MCH 32.3 05/28/2022 0937   MCHC 33.3 05/28/2022 0937   RDW 12.8 05/28/2022 0937   RDW 12.1 09/05/2019 1221   LYMPHSABS 1.5 05/28/2022 0937   LYMPHSABS 1.5 09/05/2019 1221   MONOABS 0.3 05/28/2022 0937   EOSABS 0.1 05/28/2022 0937   EOSABS 0.1 09/05/2019 1221   BASOSABS 0.0 05/28/2022 0937   BASOSABS 0.0 09/05/2019 1221    DIAGNOSTIC IMAGING:  I have independently reviewed the scans and discussed with the patient. DG Bone Survey Met  Result Date: 05/28/2022 CLINICAL DATA:  Mono clonal gammopathy unknown significance/plasma cell dyscrasia. EXAM: METASTATIC BONE SURVEY COMPARISON:  Bone survey 06/26/2021 FINDINGS: Small lesion in the calvarium are stable. No other lytic lesions in the skeleton. Negative for fracture. Degenerative change in the cervical,  thoracic, and lumbar spine. Pedicle screw and interbody fusion L2 through L5. Prior ventral hernia repair with mesh. IMPRESSION: Stable lytic lesions in the calvarium which may reflect myeloma. No new lesions. No fracture. Electronically Signed   By: Franchot Gallo M.D.   On: 05/28/2022 14:56     ASSESSMENT:  IgG kappa MGUS: - Seen at the request of Dr. Willey Blade.  Labs on 06/06/2021 at his office showed normal CBC with hemoglobin 13.3.  SPEP showed M spike of 0.3 g.  Immunofixation shows IgG kappa. - LDH and beta-2 microglobulin normal. - He reports history of melanoma of skin resected 20 years ago. - He had abdominal surgery and had infections.  Denies any new onset bone pains.  He reports occasional tingling in the legs and feet from back surgery. - 24-hour urine was negative for M spike on immunofixation. - Skeletal survey on 06/26/2021 showed 3 lucent lesions in the calvarium. - PET scan on 07/25/2021 with no evidence of metabolically active myeloma.  2.  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  Non-smoker. - Brother had non-Hodgkin's lymphoma.  Mother had breast cancer.  Dad had bladder cancer   PLAN:  1.  IgG kappa MGUS: - Myeloma labs from 05/28/2022 with normal creatinine and calcium.  M spike improved to 0.2 g from 0.3 g previously.  Hemoglobin is normal.  Kappa light chains at 28.2, ratio 1.49. - Skeletal survey on 05/28/2022 showed stable lytic lesions in the calvarium with no new lesions. - No "crab" features to warrant treatment at this time.  He may try taking iron tablet 3 times a week for his fatigue. - RTC 6 months for follow-up with repeat myeloma labs.  Orders placed this encounter:  No orders of the defined types were placed in this encounter.    Derek Jack, MD [place] [phone]

## 2022-07-29 DIAGNOSIS — K219 Gastro-esophageal reflux disease without esophagitis: Secondary | ICD-10-CM | POA: Diagnosis not present

## 2022-07-29 DIAGNOSIS — H9042 Sensorineural hearing loss, unilateral, left ear, with unrestricted hearing on the contralateral side: Secondary | ICD-10-CM | POA: Diagnosis not present

## 2022-07-29 DIAGNOSIS — K449 Diaphragmatic hernia without obstruction or gangrene: Secondary | ICD-10-CM | POA: Diagnosis not present

## 2022-07-29 DIAGNOSIS — H6993 Unspecified Eustachian tube disorder, bilateral: Secondary | ICD-10-CM | POA: Diagnosis not present

## 2022-07-31 DIAGNOSIS — H903 Sensorineural hearing loss, bilateral: Secondary | ICD-10-CM | POA: Diagnosis not present

## 2022-08-06 ENCOUNTER — Other Ambulatory Visit: Payer: Self-pay | Admitting: Otolaryngology

## 2022-08-06 DIAGNOSIS — H903 Sensorineural hearing loss, bilateral: Secondary | ICD-10-CM

## 2022-08-23 ENCOUNTER — Ambulatory Visit
Admission: RE | Admit: 2022-08-23 | Discharge: 2022-08-23 | Disposition: A | Discharge status: Home / Self Care | Payer: Medicare HMO | Source: Ambulatory Visit | Attending: Otolaryngology | Admitting: Otolaryngology

## 2022-08-23 DIAGNOSIS — H903 Sensorineural hearing loss, bilateral: Secondary | ICD-10-CM

## 2022-08-23 DIAGNOSIS — I6782 Cerebral ischemia: Secondary | ICD-10-CM | POA: Diagnosis not present

## 2022-08-23 MED ORDER — GADOPICLENOL 0.5 MMOL/ML IV SOLN
7.5000 mL | Freq: Once | INTRAVENOUS | Status: AC | PRN
  Administered 2022-08-23: 7.5 mL via INTRAVENOUS

## 2022-09-04 ENCOUNTER — Ambulatory Visit: Payer: Medicare HMO | Admitting: Physician Assistant

## 2022-09-16 DIAGNOSIS — H903 Sensorineural hearing loss, bilateral: Secondary | ICD-10-CM | POA: Diagnosis not present

## 2022-09-16 DIAGNOSIS — D333 Benign neoplasm of cranial nerves: Secondary | ICD-10-CM | POA: Diagnosis not present

## 2022-09-16 DIAGNOSIS — H9192 Unspecified hearing loss, left ear: Secondary | ICD-10-CM | POA: Diagnosis not present

## 2022-09-16 DIAGNOSIS — R930 Abnormal findings on diagnostic imaging of skull and head, not elsewhere classified: Secondary | ICD-10-CM | POA: Diagnosis not present

## 2022-11-13 ENCOUNTER — Ambulatory Visit: Payer: Medicare HMO | Admitting: Physician Assistant

## 2022-12-02 DIAGNOSIS — R030 Elevated blood-pressure reading, without diagnosis of hypertension: Secondary | ICD-10-CM | POA: Diagnosis not present

## 2022-12-02 DIAGNOSIS — E7849 Other hyperlipidemia: Secondary | ICD-10-CM | POA: Diagnosis not present

## 2022-12-02 DIAGNOSIS — Z6829 Body mass index (BMI) 29.0-29.9, adult: Secondary | ICD-10-CM | POA: Diagnosis not present

## 2022-12-02 DIAGNOSIS — K2100 Gastro-esophageal reflux disease with esophagitis, without bleeding: Secondary | ICD-10-CM | POA: Diagnosis not present

## 2022-12-02 DIAGNOSIS — R5383 Other fatigue: Secondary | ICD-10-CM | POA: Diagnosis not present

## 2022-12-02 DIAGNOSIS — E559 Vitamin D deficiency, unspecified: Secondary | ICD-10-CM | POA: Diagnosis not present

## 2022-12-02 DIAGNOSIS — D472 Monoclonal gammopathy: Secondary | ICD-10-CM | POA: Diagnosis not present

## 2022-12-05 ENCOUNTER — Inpatient Hospital Stay: Payer: Medicare HMO | Attending: Hematology

## 2022-12-05 DIAGNOSIS — D649 Anemia, unspecified: Secondary | ICD-10-CM | POA: Insufficient documentation

## 2022-12-05 DIAGNOSIS — D472 Monoclonal gammopathy: Secondary | ICD-10-CM | POA: Diagnosis not present

## 2022-12-05 DIAGNOSIS — E8809 Other disorders of plasma-protein metabolism, not elsewhere classified: Secondary | ICD-10-CM

## 2022-12-05 LAB — LACTATE DEHYDROGENASE: LDH: 162 U/L (ref 98–192)

## 2022-12-08 LAB — KAPPA/LAMBDA LIGHT CHAINS
Kappa free light chain: 26.7 mg/L — ABNORMAL HIGH (ref 3.3–19.4)
Kappa, lambda light chain ratio: 1.37 (ref 0.26–1.65)
Lambda free light chains: 19.5 mg/L (ref 5.7–26.3)

## 2022-12-10 LAB — PROTEIN ELECTROPHORESIS, SERUM
A/G Ratio: 1.3 (ref 0.7–1.7)
Albumin ELP: 3.9 g/dL (ref 2.9–4.4)
Alpha-1-Globulin: 0.2 g/dL (ref 0.0–0.4)
Alpha-2-Globulin: 0.7 g/dL (ref 0.4–1.0)
Beta Globulin: 0.9 g/dL (ref 0.7–1.3)
Gamma Globulin: 1 g/dL (ref 0.4–1.8)
Globulin, Total: 2.9 g/dL (ref 2.2–3.9)
M-Spike, %: 0.2 g/dL — ABNORMAL HIGH
Total Protein ELP: 6.8 g/dL (ref 6.0–8.5)

## 2022-12-12 ENCOUNTER — Ambulatory Visit: Payer: Medicare HMO | Admitting: Physician Assistant

## 2022-12-18 NOTE — Progress Notes (Signed)
[place], [place] [address]   CLINIC:  Medical Oncology/Hematology  PCP:  Asencion Noble, MD [address] [place] [address] [phone]   REASON FOR VISIT:  Follow-up for IgG kappa MGUS  PRIOR THERAPY: None  CURRENT THERAPY: Surveillance  INTERVAL HISTORY:   [name] 72 y.o. male returns for routine follow-up of IgG kappa MGUS.  He was last seen by Dr. Delton Coombes on 06/04/2022.  At today's visit, he reports feeling well.  No recent hospitalizations, surgeries, or changes in baseline health status.   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  He is following with ENT.  He denies any other neurologic changes such as blurry vision or headache.  He continues to have no new onset bone pain or fractures.  No B symptoms such as fever, chills, night sweats, unintentional weight loss.  He denies any new masses or lymphadenopathy.  Occasional numbness and tingling in his legs and feet related to his history of back surgery.  He has 80% energy and 80% appetite. He endorses that he is maintaining a stable weight.   ASSESSMENT & PLAN:  1.  IgG kappa MGUS: - Seen at the request of Dr. Willey Blade. Labs on 06/06/2021 at his office showed normal CBC with hemoglobin 13.3.  SPEP showed M spike of 0.3 g.  Immunofixation shows IgG kappa. - LDH and beta-2 microglobulin normal. - 24-hour urine was negative for M spike on immunofixation. - Skeletal survey on 06/26/2021 showed 3 lucent lesions in the calvarium. - PET scan on 07/25/2021 with no evidence of metabolically active myeloma.  - Skeletal survey on 05/28/2022 showed stable lytic lesions in the calvarium with no new lesions. - Most recent MGUS/myeloma labs (12/05/2022): SPEP stable at 0.2. Stable elevation in kappa light chains 26.7, normal lambda and normal ratio 1.37. LDH normal. - Labs from  PCP (12/02/2022): = Hemoglobin 12.8/MCV 93.4, creatinine 0.94, calcium 9.5 - No evidence of CRAB features at this time - Denies any new onset bone pains.  No B symptoms.  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  He reports occasional tingling in the legs and feet from back surgery. - PLAN: Repeat MGUS/myeloma labs and skeletal survey in 6 months (August/September 2024)  2.  Mild normocytic anemia - Labs from PCP (12/02/2022) shows mild normocytic anemia with Hgb 12.8 and MCV 93.4.  No evidence of renal dysfunction. - Patient denies any signs of blood loss such as rectal bleeding or melena.  No unusual fatigue. - PLAN: We will check nutritional panel today and discuss results via MyChart message to patient.  Otherwise, we will continue to monitor mild anemia with repeat CBC in 6 months.  3.  Schwannoma - Evaluated by ENT (Dr. Sabino Gasser) due to unilateral (left) sensorineural hearing loss and dizziness - MRI brain (08/23/2022) showed 2 mm focus of enhancement within the left internal auditory canal, which may reflect asymmetrically prominent vascular enhancement versus small vestibular schwannoma - He is scheduled for follow-up MRI in 6 months, ENT following  4.  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  Other history: - PMH also includes GERD and hyperlipidemia - He retired from Research officer, trade union.  No exposure to chemicals.  Currently builds  cars and remodels houses.  Non-smoker. - Brother had non-Hodgkin's lymphoma.  Mother had breast cancer.  Dad had bladder cancer   PLAN SUMMARY: >> Labs today = B12, MMA, folate, vitamin D, iron/TIBC, ferritin, copper (SEND MyChart message to discuss results) >> Labs in 6 months = CBC/D, CMP, SPEP, light chains, LDH >> Skeletal survey in 6 months >> OFFICE visit in 6 months (1 week after labs/x-ray)     REVIEW  OF SYSTEMS:   Review of Systems  Constitutional:  Negative for appetite change, chills, diaphoresis, fatigue, fever and unexpected weight change.  HENT:   Positive for hearing loss. Negative for lump/mass and nosebleeds.   Eyes:  Negative for eye problems.  Respiratory:  Negative for cough, hemoptysis and shortness of breath.   Cardiovascular:  Negative for chest pain, leg swelling and palpitations.  Gastrointestinal:  Negative for abdominal pain, blood in stool, constipation, diarrhea, nausea and vomiting.  Genitourinary:  Negative for hematuria.   Skin: Negative.   Neurological:  Positive for dizziness, headaches and numbness. Negative for light-headedness.  Hematological:  Does not bruise/bleed easily.  Psychiatric/Behavioral:  Positive for sleep disturbance.      PHYSICAL EXAM:  ECOG PERFORMANCE STATUS: 1 - Symptomatic but completely ambulatory  There were no vitals filed for this visit. There were no vitals filed for this visit. Physical Exam Constitutional:      Appearance: Normal appearance. He is normal weight.  Cardiovascular:     Heart sounds: Normal heart sounds.  Pulmonary:     Breath sounds: Normal breath sounds.  Neurological:     General: No focal deficit present.     Mental Status: Mental status is at baseline.  Psychiatric:        Behavior: Behavior normal. Behavior is cooperative.    PAST MEDICAL/SURGICAL HISTORY:  Past Medical History:  Diagnosis Date   Abdominal distension    Abdominal pain    Atypical mole 05/31/2001   Right Clavicle (marked) (excision)   Basal cell carcinoma 11/04/2011   Left Post Shoulder (excision)   BCC (basal cell carcinoma of skin) 11/04/2011   Right Post Neck (curet and 5FU)   BCC (basal cell carcinoma of skin) 08/09/2014   Left Post Auricular (sclerosing) (tx p bx)   Cancer (HCC)    skin cancer-chest and neck Melonoma right side of chest   Dizziness    GERD (gastroesophageal reflux disease)    Hypercholesteremia     Infiltrative basal cell carcinoma (BCC) 08/09/2014   Left Ear Rim (tx p bx)   Nodular basal cell carcinoma (BCC) 12/01/2019   Left Post Auriculaar (tx p bx)   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/2014   Right Forehead (in situ) (curet and 5FU)   SCCA (squamous cell carcinoma) of skin 11/04/2011   Left Outer Eye (curet and 5FU)   Squamous cell carcinoma of skin 03/25/2006   Right Outer Eyebrow (in situ)   Superficial basal cell carcinoma (BCC) 02/07/2014   Right Ear (curet and 5FU)   Superficial nodular basal cell carcinoma 09/25/2015   Right Inner Cheek (curet and 5FU)   Ventral hernia    Wears glasses    Past Surgical History:  Procedure Laterality Date   BACK SURGERY  12/03/2015   Lumbar Fusion Lumber 2-3, 3-4,4-5   BOWEL RESECTION  03/30/2012   Procedure: SMALL BOWEL RESECTION;  Surgeon: Donato Heinz, MD;  [facility]: [facility];  Service: General;  Laterality: N/A;   COLONOSCOPY N/A 01/10/2014   Procedure: COLONOSCOPY;  Surgeon:  Daneil Dolin, MD;  [facility];  Service: Endoscopy;  Laterality: N/A;  1:30 PM   COLONOSCOPY N/A 11/03/2018   Procedure: COLONOSCOPY;  Surgeon: Daneil Dolin, MD;  [facility];  Service: Endoscopy;  Laterality: N/A;  7:30   CYSTECTOMY     right thumb 2o yrs   CYSTECTOMY     HERNIA REPAIR     umbilical hernia [id number] yrs ago   [place]  10/22/2012   Procedure: LAPAROSCOPIC INCISIONAL HERNIA;  Surgeon: Adin Hector, MD;  [facility];  Service: General;  Laterality: N/A;  incarcerated   INSERTION OF MESH  10/22/2012   Procedure: INSERTION OF MESH;  Surgeon: Adin Hector, MD;  [facility];  Service: General;  Laterality: N/A;   KNEE ARTHROSCOPY     with bone removal-right leg- 2 yrs ago   LAPAROTOMY  03/30/2012   Procedure: EXPLORATORY LAPAROTOMY;  Surgeon: Donato Heinz, MD;  Location[facility];  Service: General;  Laterality: N/A;   MASS EXCISION  12/11/2011   Procedure: EXCISION MASS;   Surgeon: Marcheta Grammes, DPM;  Location: [facility];  Service: Orthopedics;  Laterality: Left;  Excision of plantar fibroma   REPAIR OF PERONEUS BREVIS TENDON Left 03/09/2014   Procedure: LEFT PERONEUS BREVIS LONGUS TENOLYSIS AND TENDON REPAIR ;  Surgeon: Wylene Simmer, MD;  Location: [facility];  Service: Orthopedics;  Laterality: Left;   WOUND EXPLORATION N/A 02/03/2017   Procedure: EXPLORE ABDOMINAL WOUND, INJECT ABSOLUTE ALCOHOL;  Surgeon: Fanny Skates, MD;  [facility];  Service: General;  Laterality: N/A;    SOCIAL HISTORY:  Social History   Socioeconomic History   Marital status: Married    Spouse name: Not on file   Number of children: 2   Years of education: college   Highest education level: Not on file  Occupational History   Occupation: Retired   Occupation: Likes to build cars.  Tobacco Use   Smoking status: Never   Smokeless tobacco: Never  Vaping Use   Vaping Use: Never used  Substance and Sexual Activity   Alcohol use: Not Currently   Drug use: No   Sexual activity: Yes  Other Topics Concern   Not on file  Social History Narrative   Lives at home with his wife.   Right-handed.   Two cups caffeine per day.   Social Determinants of Health   Financial Resource Strain: Low Risk  (06/26/2021)   Overall Financial Resource Strain (CARDIA)    Difficulty of Paying Living Expenses: Not hard at all  Food Insecurity: No Food Insecurity (06/26/2021)   Hunger Vital Sign    Worried About Running Out of Food in the Last Year: Never true    Ran Out of Food in the Last Year: Never true  Transportation Needs: No Transportation Needs (06/26/2021)   PRAPARE - Hydrologist (Medical): No    Lack of Transportation (Non-Medical): No  Physical Activity: Inactive (06/26/2021)   Exercise Vital Sign    Days of Exercise per Week: 7 days    Minutes of Exercise per Session: 0 min  Stress: No Stress Concern Present (06/26/2021)   [place]    Feeling of Stress : Not at all  Social Connections: Moderately Integrated (06/26/2021)   Social Connection and Isolation Panel [NHANES]    Frequency of Communication with Friends and Family: More than three times a  week    Frequency of Social Gatherings with Friends and Family: More than three times a week    Attends Religious Services: More than 4 times per year    Active Member of [employer] or Organizations: No    Attends Archivist Meetings: Never    Marital Status: Married  Human resources officer Violence: Not At Risk (06/26/2021)   Humiliation, Afraid, Rape, and Kick questionnaire    Fear of Current or Ex-Partner: No    Emotionally Abused: No    Physically Abused: No    Sexually Abused: No    FAMILY HISTORY:  Family History  Problem Relation Age of Onset   Stroke Mother    Breast cancer Mother    Dementia Father    Heart disease Father    Bladder Cancer Father    Diabetes Father    Diabetes Brother    Diabetes Brother    Diabetes Brother    Diabetes Brother    Anesthesia problems Neg Hx    Hypotension Neg Hx    Malignant hyperthermia Neg Hx    Pseudochol deficiency Neg Hx     CURRENT MEDICATIONS:  Outpatient Encounter Medications as of 12/19/2022  Medication Sig   aspirin EC 81 MG tablet Take 81 mg by mouth at bedtime.   atorvastatin (LIPITOR) 20 MG tablet Take 1 tablet by mouth daily.   No facility-administered encounter medications on file as of 12/19/2022.    ALLERGIES:  No Known Allergies  LABORATORY DATA:  I have reviewed the labs as listed.  CBC    Component Value Date/Time   WBC 5.2 05/28/2022 0937   RBC 4.15 (L) 05/28/2022 0937   HGB 13.4 05/28/2022 0937   HGB 13.4 09/05/2019 1221   HCT 40.2 05/28/2022 0937   HCT 38.8 09/05/2019 1221   PLT 151 05/28/2022 0937   PLT 166 09/05/2019 1221   MCV 96.9 05/28/2022 0937   MCV 94 09/05/2019 1221   MCH 32.3 05/28/2022 0937   MCHC 33.3  05/28/2022 0937   RDW 12.8 05/28/2022 0937   RDW 12.1 09/05/2019 1221   LYMPHSABS 1.5 05/28/2022 0937   LYMPHSABS 1.5 09/05/2019 1221   MONOABS 0.3 05/28/2022 0937   EOSABS 0.1 05/28/2022 0937   EOSABS 0.1 09/05/2019 1221   BASOSABS 0.0 05/28/2022 0937   BASOSABS 0.0 09/05/2019 1221      Latest Ref Rng & Units 05/28/2022    9:37 AM 11/27/2021    8:29 AM 06/26/2021   11:53 AM  CMP  Glucose 70 - 99 mg/dL 111  115  114   BUN 8 - 23 mg/dL '17  16  12   '$ Creatinine 0.61 - 1.24 mg/dL 0.80  0.80  0.80   Sodium 135 - 145 mmol/L 138  139  140   Potassium 3.5 - 5.1 mmol/L 4.5  4.7  4.6   Chloride 98 - 111 mmol/L 107  106  107   CO2 22 - 32 mmol/L '25  26  27   '$ Calcium 8.9 - 10.3 mg/dL 9.4  9.4  9.3   Total Protein 6.5 - 8.1 g/dL 7.7  7.3  7.4   Total Bilirubin 0.3 - 1.2 mg/dL 1.0  0.8  1.1   Alkaline Phos 38 - 126 U/L 50  49  48   AST 15 - 41 U/L '25  28  27   '$ ALT 0 - 44 U/L 27  34  27     DIAGNOSTIC IMAGING:  I have independently reviewed the relevant  imaging and discussed with the patient.   WRAP UP:  All questions were answered. The patient knows to call the clinic with any problems, questions or concerns.  Medical decision making: Moderate  Time spent on visit: I spent 20 minutes counseling the patient face to face. The total time spent in the appointment was 30 minutes and more than 50% was on counseling.  Harriett Rush, PA-C  12/19/22 9:27 AM

## 2022-12-19 ENCOUNTER — Inpatient Hospital Stay: Payer: Medicare HMO

## 2022-12-19 ENCOUNTER — Inpatient Hospital Stay: Payer: Medicare HMO | Attending: Physician Assistant | Admitting: Physician Assistant

## 2022-12-19 VITALS — BP 128/78 | HR 85 | Temp 98.9°F | Resp 17 | Ht 68.0 in | Wt 193.3 lb

## 2022-12-19 DIAGNOSIS — E785 Hyperlipidemia, unspecified: Secondary | ICD-10-CM | POA: Diagnosis not present

## 2022-12-19 DIAGNOSIS — Z803 Family history of malignant neoplasm of breast: Secondary | ICD-10-CM | POA: Diagnosis not present

## 2022-12-19 DIAGNOSIS — D649 Anemia, unspecified: Secondary | ICD-10-CM | POA: Insufficient documentation

## 2022-12-19 DIAGNOSIS — Z7982 Long term (current) use of aspirin: Secondary | ICD-10-CM | POA: Diagnosis not present

## 2022-12-19 DIAGNOSIS — Z79899 Other long term (current) drug therapy: Secondary | ICD-10-CM | POA: Insufficient documentation

## 2022-12-19 DIAGNOSIS — Z85828 Personal history of other malignant neoplasm of skin: Secondary | ICD-10-CM | POA: Diagnosis not present

## 2022-12-19 DIAGNOSIS — Z8639 Personal history of other endocrine, nutritional and metabolic disease: Secondary | ICD-10-CM | POA: Diagnosis not present

## 2022-12-19 DIAGNOSIS — D472 Monoclonal gammopathy: Secondary | ICD-10-CM | POA: Diagnosis not present

## 2022-12-19 DIAGNOSIS — K219 Gastro-esophageal reflux disease without esophagitis: Secondary | ICD-10-CM | POA: Diagnosis not present

## 2022-12-19 DIAGNOSIS — R42 Dizziness and giddiness: Secondary | ICD-10-CM | POA: Diagnosis not present

## 2022-12-19 DIAGNOSIS — E8809 Other disorders of plasma-protein metabolism, not elsewhere classified: Secondary | ICD-10-CM

## 2022-12-19 DIAGNOSIS — Z8052 Family history of malignant neoplasm of bladder: Secondary | ICD-10-CM | POA: Insufficient documentation

## 2022-12-19 LAB — VITAMIN D 25 HYDROXY (VIT D DEFICIENCY, FRACTURES): Vit D, 25-Hydroxy: 22.08 ng/mL — ABNORMAL LOW (ref 30–100)

## 2022-12-19 LAB — IRON AND TIBC
Iron: 73 ug/dL (ref 45–182)
Saturation Ratios: 21 % (ref 17.9–39.5)
TIBC: 351 ug/dL (ref 250–450)
UIBC: 278 ug/dL

## 2022-12-19 LAB — FOLATE: Folate: 20.7 ng/mL (ref >5.9)

## 2022-12-19 LAB — FERRITIN: Ferritin: 160 ng/mL (ref 24–336)

## 2022-12-19 LAB — VITAMIN B12: Vitamin B-12: 321 pg/mL (ref 180–914)

## 2022-12-19 NOTE — Patient Instructions (Signed)
[facility] [place] **   You were seen today by Tarri Abernethy PA-C for your follow-up visit.    MGUS ("monoclonal gammopathy of undetermined significance") As we discussed, this is a "precancerous" condition where you have slightly increased plasma cells making a slightly increased amount of abnormal immunoglobulin proteins. Although MGUS is not causing any current problems in your body, it has a 1% each year risk of progression to multiple myeloma cancer. We will continue to monitor your labs every 6 months and check whole-body x-rays once a year (next due August/September 2024) Please see the attached handout for further information regarding MGUS.  MILD ANEMIA You have very mild anemia with hemoglobin 12.8 (normal hemoglobin 13.0-17.0). We will check labs today to see if you have any iron, vitamin B12, or other nutritional deficiency. We will send you a MyChart message to discuss these results. If no cause of your anemia is found at this time, we will continue to monitor at your follow-up visit in 6 months.  At this time, your anemia is mild and no aggressive treatment is needed.  FOLLOW-UP APPOINTMENT: Follow-up visit in 6 months with labs and whole-body x-rays the week before  ** Thank you for trusting me with your healthcare!  I strive to provide all of my patients with quality care at each visit.  If you receive a survey for this visit, I would be so grateful to you for taking the time to provide feedback.  Thank you in advance!  ~ Carrick Rijos                   Dr. Derek Jack   &   Tarri Abernethy, PA-C   - - - - - - - - - - - - - - - - - -    Thank you for choosing [facility] to provide your oncology and hematology care.  To afford each patient quality time with our provider, please arrive at least 15 minutes before your scheduled appointment time.   If you have  a lab appointment with the [facility] please come in thru the Main Entrance and check in at the main information desk.  You need to re-schedule your appointment should you arrive 10 or more minutes late.  We strive to give you quality time with our providers, and arriving late affects you and other patients whose appointments are after yours.  Also, if you no show three or more times for appointments you may be dismissed from the clinic at the providers discretion.     Again, thank you for choosing [facility].  Our hope is that these requests will decrease the amount of time that you wait before being seen by our physicians.       _____________________________________________________________  Should you have questions after your visit to [facility], please contact our office at [phone] and follow the prompts.  Our office hours are 8:00 a.m. and 4:30 p.m. Monday - Friday.  Please note that voicemails left after 4:00 p.m. may not be returned until the following business day.  We are closed weekends and major holidays.  You do have access to a nurse 24-7, just call the main number to the clinic [phone] and do not press any options, hold on the line and a nurse will answer the phone.    For prescription refill requests, have your pharmacy  contact our office and allow 72 hours.

## 2022-12-23 LAB — COPPER, SERUM: Copper: 80 ug/dL (ref 69–132)

## 2022-12-24 LAB — METHYLMALONIC ACID, SERUM: Methylmalonic Acid, Quantitative: 136 nmol/L (ref 0–378)

## 2022-12-26 ENCOUNTER — Encounter: Payer: Self-pay | Admitting: Physician Assistant

## 2023-01-09 DIAGNOSIS — M5416 Radiculopathy, lumbar region: Secondary | ICD-10-CM | POA: Diagnosis not present

## 2023-01-09 DIAGNOSIS — M4316 Spondylolisthesis, lumbar region: Secondary | ICD-10-CM | POA: Diagnosis not present

## 2023-03-09 ENCOUNTER — Telehealth: Payer: Self-pay | Admitting: Internal Medicine

## 2023-03-09 ENCOUNTER — Other Ambulatory Visit: Payer: Self-pay

## 2023-03-09 MED ORDER — OMEPRAZOLE 20 MG PO CPDR: 20.0000 mg | DELAYED_RELEASE_CAPSULE | Freq: Two times a day (BID) | ORAL | 3 refills | Status: DC

## 2023-03-09 NOTE — Telephone Encounter (Signed)
Rx was sent to pharmacy on file.  

## 2023-03-09 NOTE — Telephone Encounter (Signed)
Spoke to patient yesterday.  Saw ENT for hoarseness.  Switch him to omeprazole 20 mg twice daily.  Is helping.  He would like to stay on that.  He is asked me to call in a refill.  Lets call him omeprazole 20 mg pill twice daily 30 minutes before breakfast and supper.  Dispense 180 with 3 refills.  Thanks.

## 2023-05-07 DIAGNOSIS — Z8249 Family history of ischemic heart disease and other diseases of the circulatory system: Secondary | ICD-10-CM | POA: Diagnosis not present

## 2023-05-07 DIAGNOSIS — Z833 Family history of diabetes mellitus: Secondary | ICD-10-CM | POA: Diagnosis not present

## 2023-05-07 DIAGNOSIS — K5900 Constipation, unspecified: Secondary | ICD-10-CM | POA: Diagnosis not present

## 2023-05-07 DIAGNOSIS — E785 Hyperlipidemia, unspecified: Secondary | ICD-10-CM | POA: Diagnosis not present

## 2023-05-07 DIAGNOSIS — N529 Male erectile dysfunction, unspecified: Secondary | ICD-10-CM | POA: Diagnosis not present

## 2023-05-07 DIAGNOSIS — Z7982 Long term (current) use of aspirin: Secondary | ICD-10-CM | POA: Diagnosis not present

## 2023-05-07 DIAGNOSIS — D333 Benign neoplasm of cranial nerves: Secondary | ICD-10-CM | POA: Diagnosis not present

## 2023-05-07 DIAGNOSIS — I10 Essential (primary) hypertension: Secondary | ICD-10-CM | POA: Diagnosis not present

## 2023-05-07 DIAGNOSIS — J301 Allergic rhinitis due to pollen: Secondary | ICD-10-CM | POA: Diagnosis not present

## 2023-05-07 DIAGNOSIS — K219 Gastro-esophageal reflux disease without esophagitis: Secondary | ICD-10-CM | POA: Diagnosis not present

## 2023-05-26 DIAGNOSIS — Z08 Encounter for follow-up examination after completed treatment for malignant neoplasm: Secondary | ICD-10-CM | POA: Diagnosis not present

## 2023-05-26 DIAGNOSIS — L570 Actinic keratosis: Secondary | ICD-10-CM | POA: Diagnosis not present

## 2023-05-26 DIAGNOSIS — L568 Other specified acute skin changes due to ultraviolet radiation: Secondary | ICD-10-CM | POA: Diagnosis not present

## 2023-05-26 DIAGNOSIS — Z85828 Personal history of other malignant neoplasm of skin: Secondary | ICD-10-CM | POA: Diagnosis not present

## 2023-06-11 DIAGNOSIS — Z0001 Encounter for general adult medical examination with abnormal findings: Secondary | ICD-10-CM | POA: Diagnosis not present

## 2023-06-11 DIAGNOSIS — E7849 Other hyperlipidemia: Secondary | ICD-10-CM | POA: Diagnosis not present

## 2023-06-11 DIAGNOSIS — Z125 Encounter for screening for malignant neoplasm of prostate: Secondary | ICD-10-CM | POA: Diagnosis not present

## 2023-06-11 DIAGNOSIS — K2100 Gastro-esophageal reflux disease with esophagitis, without bleeding: Secondary | ICD-10-CM | POA: Diagnosis not present

## 2023-06-11 DIAGNOSIS — R5383 Other fatigue: Secondary | ICD-10-CM | POA: Diagnosis not present

## 2023-06-18 DIAGNOSIS — R5383 Other fatigue: Secondary | ICD-10-CM | POA: Diagnosis not present

## 2023-06-18 DIAGNOSIS — E7849 Other hyperlipidemia: Secondary | ICD-10-CM | POA: Diagnosis not present

## 2023-06-18 DIAGNOSIS — D472 Monoclonal gammopathy: Secondary | ICD-10-CM | POA: Diagnosis not present

## 2023-06-18 DIAGNOSIS — R030 Elevated blood-pressure reading, without diagnosis of hypertension: Secondary | ICD-10-CM | POA: Diagnosis not present

## 2023-06-18 DIAGNOSIS — Z0001 Encounter for general adult medical examination with abnormal findings: Secondary | ICD-10-CM | POA: Diagnosis not present

## 2023-06-18 DIAGNOSIS — Z6829 Body mass index (BMI) 29.0-29.9, adult: Secondary | ICD-10-CM | POA: Diagnosis not present

## 2023-06-18 DIAGNOSIS — K2100 Gastro-esophageal reflux disease with esophagitis, without bleeding: Secondary | ICD-10-CM | POA: Diagnosis not present

## 2023-06-18 DIAGNOSIS — M5416 Radiculopathy, lumbar region: Secondary | ICD-10-CM | POA: Diagnosis not present

## 2023-06-26 ENCOUNTER — Inpatient Hospital Stay: Payer: Medicare HMO | Attending: Hematology

## 2023-06-26 ENCOUNTER — Ambulatory Visit (HOSPITAL_COMMUNITY)
Admission: RE | Admit: 2023-06-26 | Discharge: 2023-06-26 | Disposition: A | Discharge status: Home / Self Care | Payer: Medicare HMO | Source: Ambulatory Visit | Attending: Physician Assistant | Admitting: Physician Assistant

## 2023-06-26 DIAGNOSIS — M898X8 Other specified disorders of bone, other site: Secondary | ICD-10-CM | POA: Diagnosis not present

## 2023-06-26 DIAGNOSIS — D649 Anemia, unspecified: Secondary | ICD-10-CM | POA: Insufficient documentation

## 2023-06-26 DIAGNOSIS — R200 Anesthesia of skin: Secondary | ICD-10-CM | POA: Diagnosis not present

## 2023-06-26 DIAGNOSIS — Z79899 Other long term (current) drug therapy: Secondary | ICD-10-CM | POA: Diagnosis not present

## 2023-06-26 DIAGNOSIS — G8929 Other chronic pain: Secondary | ICD-10-CM | POA: Diagnosis not present

## 2023-06-26 DIAGNOSIS — Z803 Family history of malignant neoplasm of breast: Secondary | ICD-10-CM | POA: Insufficient documentation

## 2023-06-26 DIAGNOSIS — D472 Monoclonal gammopathy: Secondary | ICD-10-CM | POA: Diagnosis not present

## 2023-06-26 DIAGNOSIS — Z7982 Long term (current) use of aspirin: Secondary | ICD-10-CM | POA: Insufficient documentation

## 2023-06-26 DIAGNOSIS — D3610 Benign neoplasm of peripheral nerves and autonomic nervous system, unspecified: Secondary | ICD-10-CM | POA: Diagnosis not present

## 2023-06-26 DIAGNOSIS — D72829 Elevated white blood cell count, unspecified: Secondary | ICD-10-CM | POA: Diagnosis not present

## 2023-06-26 DIAGNOSIS — R202 Paresthesia of skin: Secondary | ICD-10-CM | POA: Diagnosis not present

## 2023-06-26 DIAGNOSIS — M549 Dorsalgia, unspecified: Secondary | ICD-10-CM | POA: Insufficient documentation

## 2023-06-26 DIAGNOSIS — E559 Vitamin D deficiency, unspecified: Secondary | ICD-10-CM | POA: Diagnosis not present

## 2023-06-26 DIAGNOSIS — Z85828 Personal history of other malignant neoplasm of skin: Secondary | ICD-10-CM | POA: Diagnosis not present

## 2023-06-26 LAB — CBC WITH DIFFERENTIAL/PLATELET
Abs Immature Granulocytes: 0.04 10*3/uL (ref 0.00–0.07)
Basophils Absolute: 0 10*3/uL (ref 0.0–0.1)
Basophils Relative: 0 %
Eosinophils Absolute: 0.1 10*3/uL (ref 0.0–0.5)
Eosinophils Relative: 1 %
HCT: 40.9 % (ref 39.0–52.0)
Hemoglobin: 14 g/dL (ref 13.0–17.0)
Immature Granulocytes: 0 %
Lymphocytes Relative: 25 %
Lymphs Abs: 2.6 10*3/uL (ref 0.7–4.0)
MCH: 33 pg (ref 26.0–34.0)
MCHC: 34.2 g/dL (ref 30.0–36.0)
MCV: 96.5 fL (ref 80.0–100.0)
Monocytes Absolute: 0.6 10*3/uL (ref 0.1–1.0)
Monocytes Relative: 6 %
Neutro Abs: 7.3 10*3/uL (ref 1.7–7.7)
Neutrophils Relative %: 68 %
Platelets: 180 10*3/uL (ref 150–400)
RBC: 4.24 MIL/uL (ref 4.22–5.81)
RDW: 13.1 % (ref 11.5–15.5)
WBC: 10.6 10*3/uL — ABNORMAL HIGH (ref 4.0–10.5)
nRBC: 0 % (ref 0.0–0.2)

## 2023-06-26 LAB — COMPREHENSIVE METABOLIC PANEL
ALT: 30 U/L (ref 0–44)
AST: 23 U/L (ref 15–41)
Albumin: 4.4 g/dL (ref 3.5–5.0)
Alkaline Phosphatase: 48 U/L (ref 38–126)
Anion gap: 11 (ref 5–15)
BUN: 15 mg/dL (ref 8–23)
CO2: 26 mmol/L (ref 22–32)
Calcium: 9.5 mg/dL (ref 8.9–10.3)
Chloride: 100 mmol/L (ref 98–111)
Creatinine, Ser: 0.93 mg/dL (ref 0.61–1.24)
GFR, Estimated: >60 mL/min (ref >60)
Glucose, Bld: 101 mg/dL — ABNORMAL HIGH (ref 70–99)
Potassium: 3.9 mmol/L (ref 3.5–5.1)
Sodium: 137 mmol/L (ref 135–145)
Total Bilirubin: 1.4 mg/dL — ABNORMAL HIGH (ref 0.3–1.2)
Total Protein: 7.6 g/dL (ref 6.5–8.1)

## 2023-06-26 LAB — LACTATE DEHYDROGENASE: LDH: 138 U/L (ref 98–192)

## 2023-06-29 LAB — KAPPA/LAMBDA LIGHT CHAINS
Kappa free light chain: 15.9 mg/L (ref 3.3–19.4)
Kappa, lambda light chain ratio: 1.33 (ref 0.26–1.65)
Lambda free light chains: 12 mg/L (ref 5.7–26.3)

## 2023-07-01 LAB — PROTEIN ELECTROPHORESIS, SERUM
A/G Ratio: 1.4 (ref 0.7–1.7)
Albumin ELP: 4.1 g/dL (ref 2.9–4.4)
Alpha-1-Globulin: 0.2 g/dL (ref 0.0–0.4)
Alpha-2-Globulin: 0.7 g/dL (ref 0.4–1.0)
Beta Globulin: 1 g/dL (ref 0.7–1.3)
Gamma Globulin: 1 g/dL (ref 0.4–1.8)
Globulin, Total: 2.9 g/dL (ref 2.2–3.9)
M-Spike, %: 0.3 g/dL — ABNORMAL HIGH
Total Protein ELP: 7 g/dL (ref 6.0–8.5)

## 2023-07-03 ENCOUNTER — Inpatient Hospital Stay: Payer: Medicare HMO

## 2023-07-03 ENCOUNTER — Inpatient Hospital Stay (HOSPITAL_BASED_OUTPATIENT_CLINIC_OR_DEPARTMENT_OTHER): Payer: Medicare HMO | Admitting: Oncology

## 2023-07-03 DIAGNOSIS — E559 Vitamin D deficiency, unspecified: Secondary | ICD-10-CM

## 2023-07-03 DIAGNOSIS — R202 Paresthesia of skin: Secondary | ICD-10-CM | POA: Diagnosis not present

## 2023-07-03 DIAGNOSIS — Z85828 Personal history of other malignant neoplasm of skin: Secondary | ICD-10-CM | POA: Diagnosis not present

## 2023-07-03 DIAGNOSIS — D3610 Benign neoplasm of peripheral nerves and autonomic nervous system, unspecified: Secondary | ICD-10-CM | POA: Diagnosis not present

## 2023-07-03 DIAGNOSIS — G8929 Other chronic pain: Secondary | ICD-10-CM | POA: Diagnosis not present

## 2023-07-03 DIAGNOSIS — M549 Dorsalgia, unspecified: Secondary | ICD-10-CM | POA: Diagnosis not present

## 2023-07-03 DIAGNOSIS — Z7982 Long term (current) use of aspirin: Secondary | ICD-10-CM | POA: Diagnosis not present

## 2023-07-03 DIAGNOSIS — R200 Anesthesia of skin: Secondary | ICD-10-CM | POA: Diagnosis not present

## 2023-07-03 DIAGNOSIS — D472 Monoclonal gammopathy: Secondary | ICD-10-CM | POA: Diagnosis not present

## 2023-07-03 DIAGNOSIS — D649 Anemia, unspecified: Secondary | ICD-10-CM | POA: Diagnosis not present

## 2023-07-03 DIAGNOSIS — D72829 Elevated white blood cell count, unspecified: Secondary | ICD-10-CM | POA: Diagnosis not present

## 2023-07-03 DIAGNOSIS — Z803 Family history of malignant neoplasm of breast: Secondary | ICD-10-CM | POA: Diagnosis not present

## 2023-07-03 DIAGNOSIS — Z79899 Other long term (current) drug therapy: Secondary | ICD-10-CM | POA: Diagnosis not present

## 2023-07-03 LAB — VITAMIN D 25 HYDROXY (VIT D DEFICIENCY, FRACTURES): Vit D, 25-Hydroxy: 64.57 ng/mL (ref 30–100)

## 2023-07-03 NOTE — Progress Notes (Signed)
[facility] [address], [place] [address]   CLINIC:  Medical Oncology/Hematology  PCP:  Richardean Chimera, MD [address] [place] [address] [phone]   REASON FOR VISIT:  Follow-up for IgG kappa MGUS  PRIOR THERAPY: None  CURRENT THERAPY: Surveillance  INTERVAL HISTORY:   Jeffrey [name] 72 y.o. male returns for routine follow-up of IgG kappa MGUS.  He was last seen in clinic on 12/19/2022 by Rojelio Brenner, PA.  He denies any recent hospitalizations, surgeries or changes in baseline health.    He was recently started on omeprazole 20 mg tablets twice a daily for hoarseness. This appears to be helping.  Reports stable chronic back pain.  Recently was prescribed prednisone x 5 days for low right-sided back pain that radiated down his leg.  Symptoms have improved.    At today's visit, he reports feeling well.  No recent hospitalizations, surgeries, or changes in baseline health status.   He has follow-up in November for repeat MRI for schwannoma.   At his last visit, he was started on vitamin D supplements which he has been taking.  He continues to have no new onset bone pain or fractures.  No B symptoms such as fever, chills, night sweats, unintentional weight loss.  He denies any new masses or lymphadenopathy.  He has intermittent numbness and tingling in his legs and back which is a chronic problem.  His appetite is 100% and energy levels are 85%.   ASSESSMENT & PLAN:  1.  IgG kappa MGUS: - Seen at the request of Dr. Ouida Sills. Labs on 06/06/2021 at his office showed normal CBC with hemoglobin 13.3.  SPEP showed M spike of 0.3 g.  Immunofixation shows IgG kappa. - LDH and beta-2 microglobulin normal. - 24-hour urine was negative for M spike on immunofixation. - Skeletal survey on 06/26/2021 showed 3 lucent lesions in the calvarium. - PET scan on 07/25/2021 with no evidence of metabolically active myeloma.  - Skeletal survey on 05/28/2022 showed stable lytic  lesions in the calvarium with no new lesions. -Bone survey from 06/26/2023 is still pending. -Most recent MGUS labs from 06/26/2023 show M spike 0.3 (0.2), improvement in kappa free light chain 15.9 (26.7) within normal light chain ratio.  LDH is normal. -CBC from 06/26/2023 shows hemoglobin of 14.0, calcium 9.5 and creatinine 0.93. - No evidence of CRAB features at this time - Denies any new onset bone pains.  No B symptoms.   -Follow-up in 9 months with labs d/t patient preference.   2.  Mild normocytic anemia - Labs from 06/26/2023 show white count of 10.6, hemoglobin 14.0 and MCV 96.5.  Unremarkable differential.  No evidence of renal dysfunction. -Denies any bleeding. -Labs from 12/19/2022 showed normal ferritin, iron panel, copper, B12, MMA and folate.  Vitamin D low at 22.08. - We did not check his vitamin D level when labs were drawn.  He would like to have this checked.  Will call with results.  3.  Schwannoma - Evaluated by ENT (Dr. Ernestene Kiel) due to unilateral (left) sensorineural hearing loss and dizziness - MRI brain (08/23/2022) showed 2 mm focus of enhancement within the left internal auditory canal, which may reflect asymmetrically prominent vascular enhancement versus small vestibular schwannoma - He is scheduled for follow-up MRI in November 2024.  4.  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  Vitamin D deficiency -She was started on vitamin D supplements which his PCP told him to double. -Will check vitamin D level today.  Will call with results.  6.  Leukocytosis -Secondary to recent prednisone. -No signs of infection.  PLAN SUMMARY: >> Return to clinic in 9 months for follow-up with labs a few days before and see provider. >> Will add on vitamin D levels today.  Will call with results. >> Will call with results from skeletal survey.       REVIEW OF  SYSTEMS:   Review of Systems  Gastrointestinal:  Positive for constipation.  Musculoskeletal:  Positive for back pain (Chronic).  Neurological:  Positive for dizziness and numbness.     PHYSICAL EXAM:  ECOG PERFORMANCE STATUS: 1 - Symptomatic but completely ambulatory  There were no vitals filed for this visit. There were no vitals filed for this visit. Physical Exam Constitutional:      Appearance: Normal appearance.  Cardiovascular:     Rate and Rhythm: Normal rate and regular rhythm.  Pulmonary:     Effort: Pulmonary effort is normal.     Breath sounds: Normal breath sounds.  Abdominal:     General: Bowel sounds are normal.     Palpations: Abdomen is soft.  Musculoskeletal:        General: No swelling. Normal range of motion.  Neurological:     Mental Status: He is alert and oriented to person, place, and time. Mental status is at baseline.    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/09/2014   Left Ear Rim (tx p bx)   Nodular basal cell carcinoma (BCC) 12/01/2019   Left Post Auriculaar (tx p bx)   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/2014   Right Forehead (in situ) (curet and 5FU)   SCCA (squamous cell carcinoma) of skin 11/04/2011   Left Outer Eye (curet and 5FU)   Squamous cell carcinoma of skin 03/25/2006   Right Outer Eyebrow (in situ)   Superficial basal cell carcinoma (BCC)  02/07/2014   Right Ear (curet and 5FU)   Superficial nodular basal cell carcinoma 09/25/2015   Right Inner Cheek (curet and 5FU)   Ventral hernia    Wears glasses    Past Surgical History:  Procedure Laterality Date   BACK SURGERY  12/03/2015   Lumbar Fusion Lumber 2-3, 3-4,4-5   BOWEL RESECTION  03/30/2012   Procedure: SMALL BOWEL RESECTION;  Surgeon: Fabio Bering, MD;  Location: [facility];  Service: General;  Laterality: N/A;   COLONOSCOPY N/A 01/10/2014   Procedure: COLONOSCOPY;  Surgeon: Corbin Ade, MD;  Location: [facility];  Service: Endoscopy;  Laterality: N/A;  1:30 PM   COLONOSCOPY N/A 11/03/2018   Procedure: COLONOSCOPY;  Surgeon: Corbin Ade, MD;  Location: [facility];  Service: Endoscopy;  Laterality: N/A;  7:30   CYSTECTOMY     right thumb 2o yrs   CYSTECTOMY     HERNIA REPAIR     umbilical hernia repair-3-4 yrs ago   INCISIONAL HERNIA REPAIR  10/22/2012   Procedure: LAPAROSCOPIC INCISIONAL HERNIA;  Surgeon: Ernestene Mention, MD;  Location: [facility];  Service: General;  Laterality: N/A;  incarcerated   INSERTION OF MESH  10/22/2012   Procedure: INSERTION OF MESH;  Surgeon: Ernestene Mention, MD;  Location: [facility];  Service: General;  Laterality: N/A;   KNEE ARTHROSCOPY     with bone removal-right leg- 2 yrs ago   LAPAROTOMY  03/30/2012   Procedure: EXPLORATORY LAPAROTOMY;  Surgeon: Fabio Bering, MD;  Location: [facility];  Service: General;  Laterality: N/A;   MASS EXCISION  12/11/2011   Procedure: EXCISION MASS;  Surgeon: Dallas Schimke, DPM;  Location: [facility];  Service: Orthopedics;  Laterality: Left;  Excision of plantar fibroma   REPAIR OF PERONEUS BREVIS TENDON Left 03/09/2014   Procedure: LEFT PERONEUS BREVIS LONGUS TENOLYSIS AND TENDON REPAIR ;  Surgeon: Toni Arthurs, MD;  Location: [facility];  Service: Orthopedics;  Laterality: Left;   WOUND EXPLORATION N/A 02/03/2017   Procedure: EXPLORE ABDOMINAL WOUND, INJECT ABSOLUTE ALCOHOL;  Surgeon:  Claud Kelp, MD;  Location: [facility];  Service: General;  Laterality: N/A;    SOCIAL HISTORY:  Social History   Socioeconomic History   Marital status: Married    Spouse name: Not on file   Number of children: 2   Years of education: college   Highest education level: Not on file  Occupational History   Occupation: Retired   Occupation: Likes to build cars.  Tobacco Use   Smoking status: Never   Smokeless tobacco: Never  Vaping Use   Vaping status: Never Used  Substance and Sexual Activity   Alcohol use: Not Currently   Drug use: No   Sexual activity: Yes  Other Topics Concern   Not on file  Social History Narrative   Lives at home with his wife.   Right-handed.   Two cups caffeine per day.   Social Determinants of Health   Financial Resource Strain: Low Risk  (06/26/2021)   Overall Financial Resource Strain (CARDIA)    Difficulty of Paying Living Expenses: Not hard at all  Food Insecurity: No Food Insecurity (06/26/2021)   Hunger Vital Sign    Worried About Running Out of Food in the Last Year: Never true    Ran Out of Food in the Last Year: Never true  Transportation Needs: No Transportation Needs (06/26/2021)   PRAPARE - Administrator, Civil Service (Medical): No    Lack of Transportation (Non-Medical): No  Physical Activity: Inactive (06/26/2021)   Exercise Vital Sign    Days of Exercise per Week: 7 days    Minutes of Exercise per Session: 0 min  Stress: No Stress Concern Present (06/26/2021)   [employer] of Occupational Health - Occupational Stress Questionnaire    Feeling of Stress : Not at all  Social Connections: Moderately Integrated (06/26/2021)   Social Connection and Isolation Panel [NHANES]    Frequency of Communication with Friends and Family: More than three times a week    Frequency of Social Gatherings with Friends and Family: More than three times a week    Attends Religious Services: More than 4 times per year    Active Member of Clubs  or Organizations: No  Attends Banker Meetings: Never    Marital Status: Married  Catering manager Violence: Not At Risk (06/26/2021)   Humiliation, Afraid, Rape, and Kick questionnaire    Fear of Current or Ex-Partner: No    Emotionally Abused: No    Physically Abused: No    Sexually Abused: No    FAMILY HISTORY:  Family History  Problem Relation Age of Onset   Stroke Mother    Breast cancer Mother    Dementia Father    Heart disease Father    Bladder Cancer Father    Diabetes Father    Diabetes Brother    Diabetes Brother    Diabetes Brother    Diabetes Brother    Anesthesia problems Neg Hx    Hypotension Neg Hx    Malignant hyperthermia Neg Hx    Pseudochol deficiency Neg Hx     CURRENT MEDICATIONS:  Outpatient Encounter Medications as of 07/03/2023  Medication Sig   aspirin EC 81 MG tablet Take 81 mg by mouth at bedtime.   atorvastatin (LIPITOR) 20 MG tablet Take 1 tablet by mouth daily.   omeprazole (PRILOSEC) 20 MG capsule Take 1 capsule (20 mg total) by mouth 2 (two) times daily before a meal.   No facility-administered encounter medications on file as of 07/03/2023.    ALLERGIES:  No Known Allergies  LABORATORY DATA:  I have reviewed the labs as listed.  CBC    Component Value Date/Time   WBC 10.6 (H) 06/26/2023 1301   RBC 4.24 06/26/2023 1301   HGB 14.0 06/26/2023 1301   HGB 13.4 09/05/2019 1221   HCT 40.9 06/26/2023 1301   HCT 38.8 09/05/2019 1221   PLT 180 06/26/2023 1301   PLT 166 09/05/2019 1221   MCV 96.5 06/26/2023 1301   MCV 94 09/05/2019 1221   MCH 33.0 06/26/2023 1301   MCHC 34.2 06/26/2023 1301   RDW 13.1 06/26/2023 1301   RDW 12.1 09/05/2019 1221   LYMPHSABS 2.6 06/26/2023 1301   LYMPHSABS 1.5 09/05/2019 1221   MONOABS 0.6 06/26/2023 1301   EOSABS 0.1 06/26/2023 1301   EOSABS 0.1 09/05/2019 1221   BASOSABS 0.0 06/26/2023 1301   BASOSABS 0.0 09/05/2019 1221      Latest Ref Rng & Units 06/26/2023    1:01 PM 05/28/2022     9:37 AM 11/27/2021    8:29 AM  CMP  Glucose 70 - 99 mg/dL 401  027  253   BUN 8 - 23 mg/dL 15  17  16    Creatinine 0.61 - 1.24 mg/dL 6.64  4.03  4.74   Sodium 135 - 145 mmol/L 137  138  139   Potassium 3.5 - 5.1 mmol/L 3.9  4.5  4.7   Chloride 98 - 111 mmol/L 100  107  106   CO2 22 - 32 mmol/L 26  25  26    Calcium 8.9 - 10.3 mg/dL 9.5  9.4  9.4   Total Protein 6.5 - 8.1 g/dL 7.6  7.7  7.3   Total Bilirubin 0.3 - 1.2 mg/dL 1.4  1.0  0.8   Alkaline Phos 38 - 126 U/L 48  50  49   AST 15 - 41 U/L 23  25  28    ALT 0 - 44 U/L 30  27  34     DIAGNOSTIC IMAGING:  I have independently reviewed the relevant imaging and discussed with the patient.   WRAP UP:  All questions were answered. The patient knows to call  the clinic with any problems, questions or concerns.  Medical decision making: Moderate  Time spent on visit: I spent 20 minutes counseling the patient face to face. The total time spent in the appointment was 30 minutes and more than 50% was on counseling.  Mauro Kaufmann, NP  07/03/23 9:22 AM

## 2023-08-18 IMAGING — DX DG BONE SURVEY MET
8 of 10 series · 8 of 10 positions shown · non-contrast
Comparison: None.

CLINICAL DATA: History of multiple myeloma.

EXAM:
METASTATIC BONE SURVEY

[skull lat]
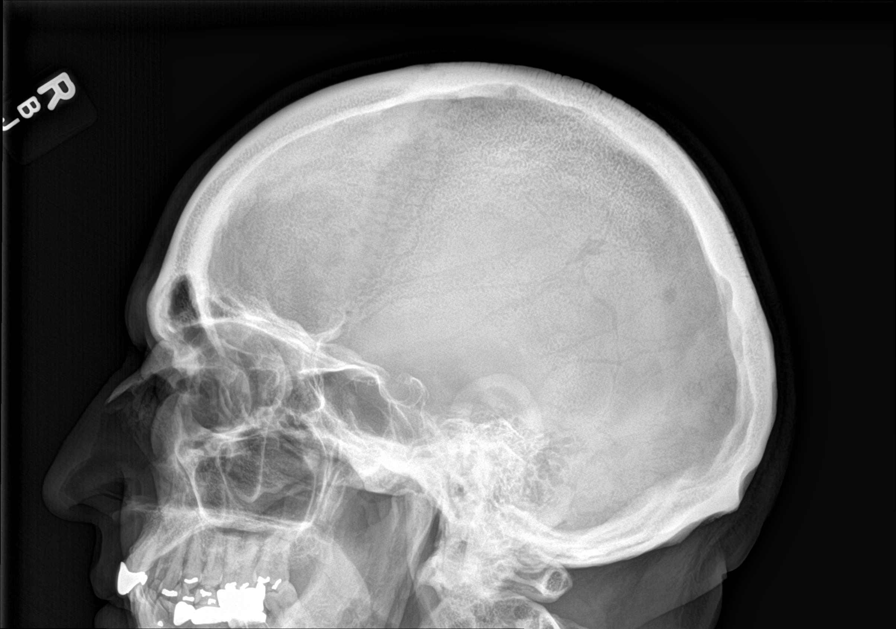

[shoulder ap (1 of 2)]
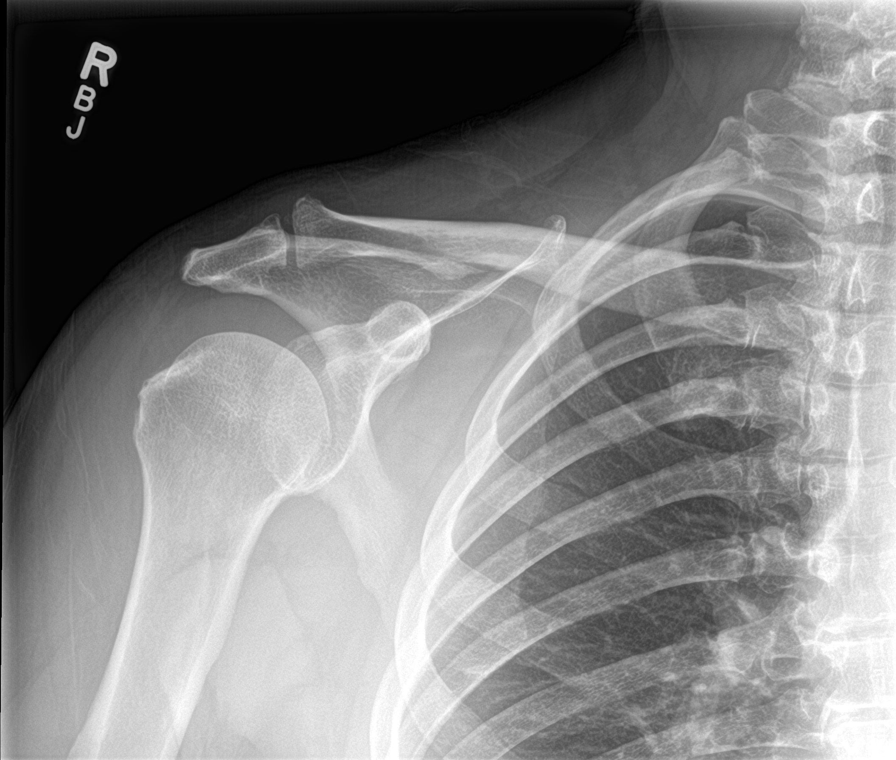

[shoulder ap (2 of 2)]
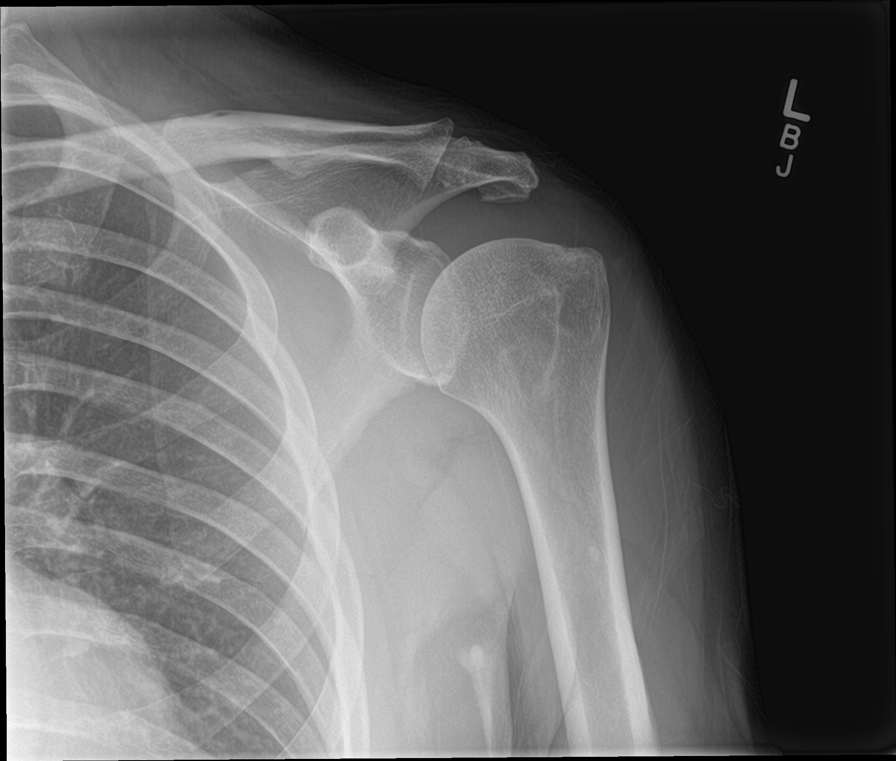

[humerus ap (1 of 2)]
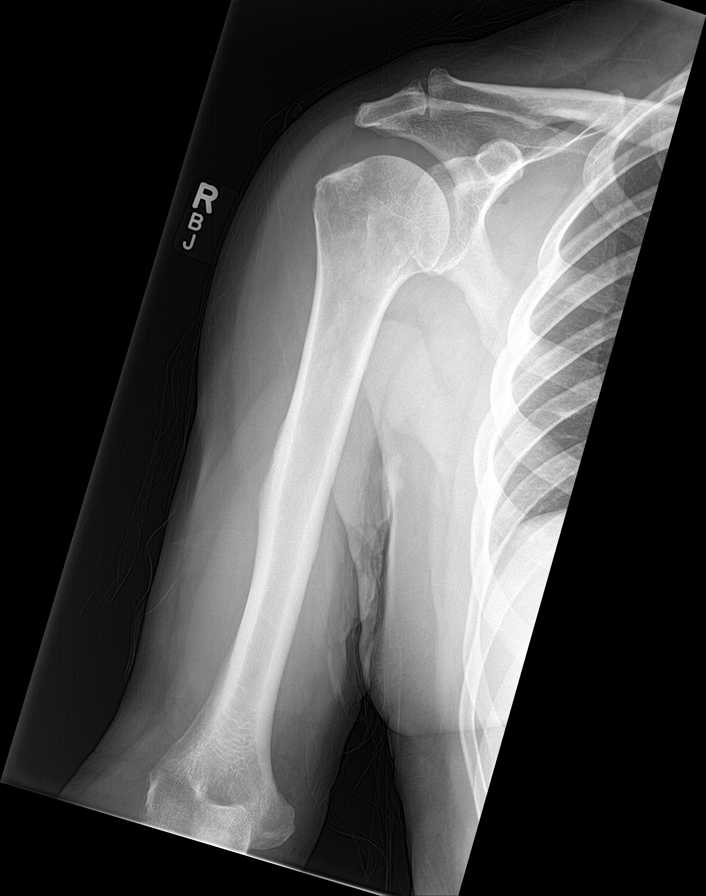

[humerus ap (2 of 2)]
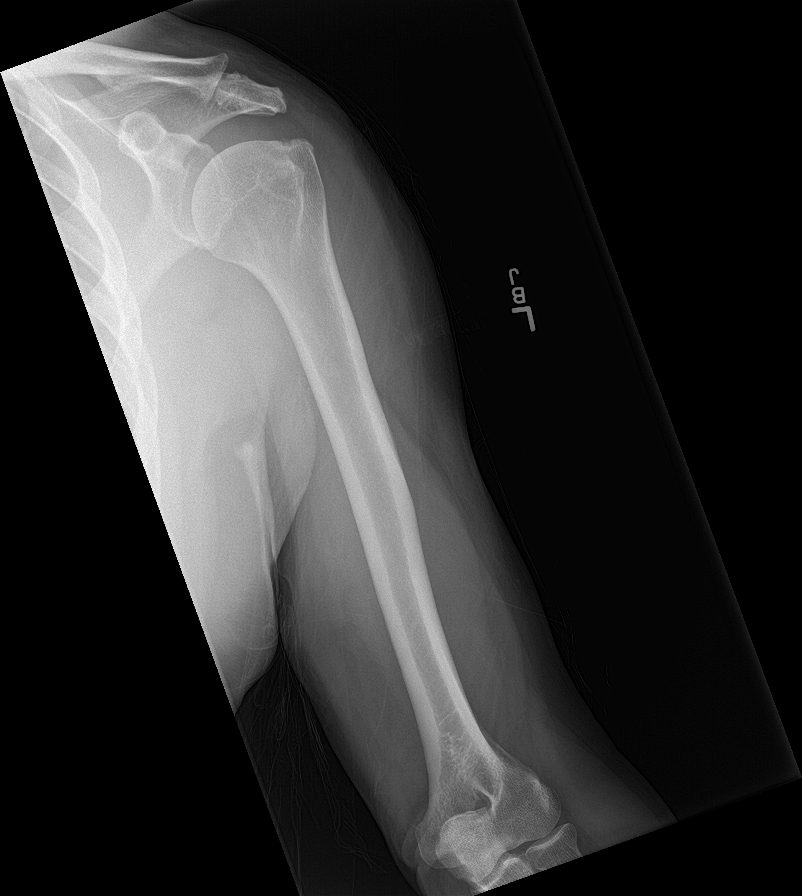

[forearm ap (1 of 2)]
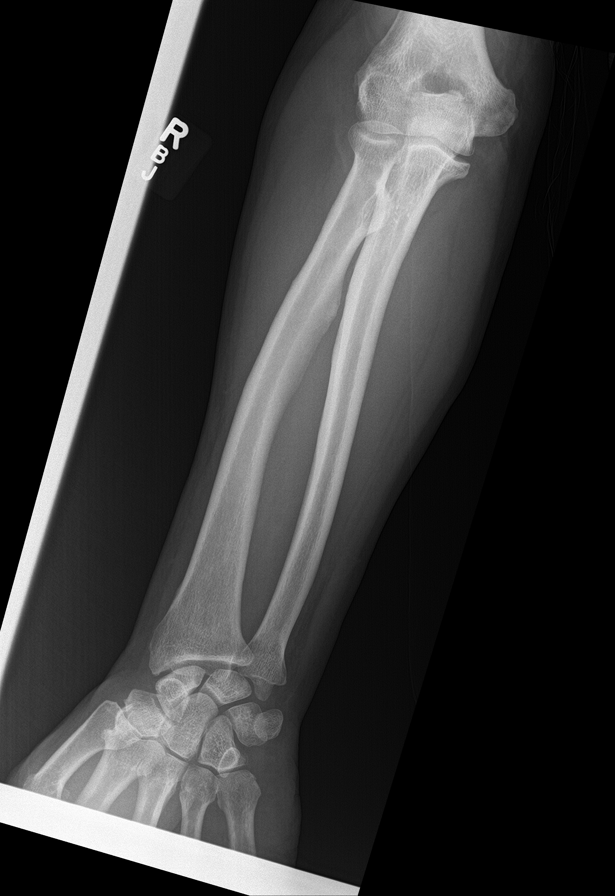

[forearm ap (2 of 2)]
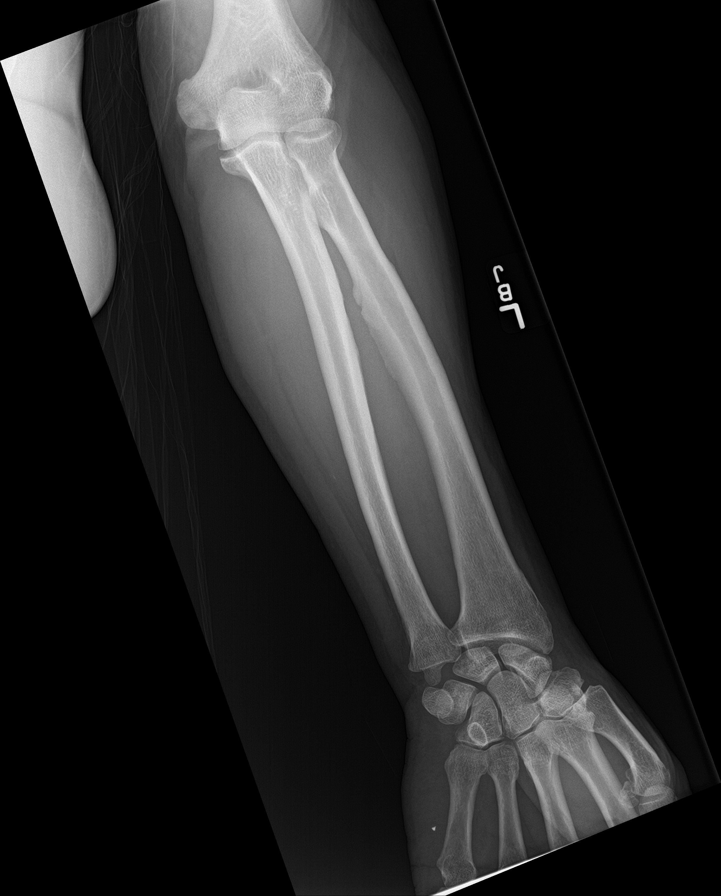

[c-spine ap]
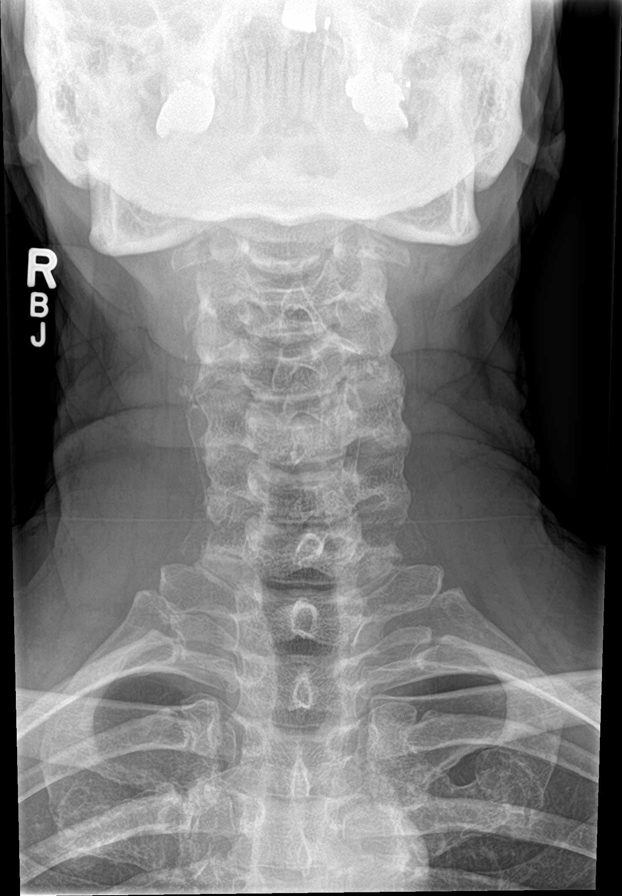

[8 of 10 positions shown; findings below may reference images not displayed]

FINDINGS: There 3 lucent lesions identified within the calvarium measuring up
to 6 mm which are moderately suspicious for lesions of myeloma.
These measure up to 6 mm. No additional lucent lesions identified
within the remainder of the axial and appendicular skeleton.
IMPRESSION: Three lucent lesions within the calvarium are moderately suspicious
for lesions of myeloma. Consider further evaluation with PET-CT.

## 2023-08-24 DIAGNOSIS — L568 Other specified acute skin changes due to ultraviolet radiation: Secondary | ICD-10-CM | POA: Diagnosis not present

## 2023-08-24 DIAGNOSIS — Z85828 Personal history of other malignant neoplasm of skin: Secondary | ICD-10-CM | POA: Diagnosis not present

## 2023-08-24 DIAGNOSIS — C44519 Basal cell carcinoma of skin of other part of trunk: Secondary | ICD-10-CM | POA: Diagnosis not present

## 2023-08-24 DIAGNOSIS — Z08 Encounter for follow-up examination after completed treatment for malignant neoplasm: Secondary | ICD-10-CM | POA: Diagnosis not present

## 2023-08-24 DIAGNOSIS — D485 Neoplasm of uncertain behavior of skin: Secondary | ICD-10-CM | POA: Diagnosis not present

## 2023-09-01 DIAGNOSIS — H5203 Hypermetropia, bilateral: Secondary | ICD-10-CM | POA: Diagnosis not present

## 2023-09-03 DIAGNOSIS — Z01118 Encounter for examination of ears and hearing with other abnormal findings: Secondary | ICD-10-CM | POA: Diagnosis not present

## 2023-09-03 DIAGNOSIS — D333 Benign neoplasm of cranial nerves: Secondary | ICD-10-CM | POA: Diagnosis not present

## 2023-09-03 DIAGNOSIS — R42 Dizziness and giddiness: Secondary | ICD-10-CM | POA: Diagnosis not present

## 2023-09-03 DIAGNOSIS — H903 Sensorineural hearing loss, bilateral: Secondary | ICD-10-CM | POA: Diagnosis not present

## 2023-09-03 DIAGNOSIS — M542 Cervicalgia: Secondary | ICD-10-CM | POA: Diagnosis not present

## 2023-09-05 DIAGNOSIS — Z20828 Contact with and (suspected) exposure to other viral communicable diseases: Secondary | ICD-10-CM | POA: Diagnosis not present

## 2023-09-05 DIAGNOSIS — R059 Cough, unspecified: Secondary | ICD-10-CM | POA: Diagnosis not present

## 2023-09-05 DIAGNOSIS — D472 Monoclonal gammopathy: Secondary | ICD-10-CM | POA: Diagnosis not present

## 2023-09-05 DIAGNOSIS — Z6829 Body mass index (BMI) 29.0-29.9, adult: Secondary | ICD-10-CM | POA: Diagnosis not present

## 2023-09-05 DIAGNOSIS — R030 Elevated blood-pressure reading, without diagnosis of hypertension: Secondary | ICD-10-CM | POA: Diagnosis not present

## 2023-09-15 DIAGNOSIS — L568 Other specified acute skin changes due to ultraviolet radiation: Secondary | ICD-10-CM | POA: Diagnosis not present

## 2023-09-15 DIAGNOSIS — D485 Neoplasm of uncertain behavior of skin: Secondary | ICD-10-CM | POA: Diagnosis not present

## 2023-09-15 DIAGNOSIS — C44519 Basal cell carcinoma of skin of other part of trunk: Secondary | ICD-10-CM | POA: Diagnosis not present

## 2023-09-15 DIAGNOSIS — Z85828 Personal history of other malignant neoplasm of skin: Secondary | ICD-10-CM | POA: Diagnosis not present

## 2023-09-15 DIAGNOSIS — L570 Actinic keratosis: Secondary | ICD-10-CM | POA: Diagnosis not present

## 2023-09-15 DIAGNOSIS — L988 Other specified disorders of the skin and subcutaneous tissue: Secondary | ICD-10-CM | POA: Diagnosis not present

## 2023-09-15 DIAGNOSIS — Z08 Encounter for follow-up examination after completed treatment for malignant neoplasm: Secondary | ICD-10-CM | POA: Diagnosis not present

## 2023-09-29 DIAGNOSIS — R030 Elevated blood-pressure reading, without diagnosis of hypertension: Secondary | ICD-10-CM | POA: Diagnosis not present

## 2023-09-29 DIAGNOSIS — R059 Cough, unspecified: Secondary | ICD-10-CM | POA: Diagnosis not present

## 2023-09-29 DIAGNOSIS — L568 Other specified acute skin changes due to ultraviolet radiation: Secondary | ICD-10-CM | POA: Diagnosis not present

## 2023-09-29 DIAGNOSIS — Z6830 Body mass index (BMI) 30.0-30.9, adult: Secondary | ICD-10-CM | POA: Diagnosis not present

## 2023-09-29 DIAGNOSIS — Z20828 Contact with and (suspected) exposure to other viral communicable diseases: Secondary | ICD-10-CM | POA: Diagnosis not present

## 2023-09-29 DIAGNOSIS — R509 Fever, unspecified: Secondary | ICD-10-CM | POA: Diagnosis not present

## 2023-09-29 DIAGNOSIS — L570 Actinic keratosis: Secondary | ICD-10-CM | POA: Diagnosis not present

## 2023-10-01 DIAGNOSIS — Z20828 Contact with and (suspected) exposure to other viral communicable diseases: Secondary | ICD-10-CM | POA: Diagnosis not present

## 2023-10-01 DIAGNOSIS — R059 Cough, unspecified: Secondary | ICD-10-CM | POA: Diagnosis not present

## 2023-10-01 DIAGNOSIS — J101 Influenza due to other identified influenza virus with other respiratory manifestations: Secondary | ICD-10-CM | POA: Diagnosis not present

## 2023-10-01 DIAGNOSIS — Z6829 Body mass index (BMI) 29.0-29.9, adult: Secondary | ICD-10-CM | POA: Diagnosis not present

## 2023-10-01 DIAGNOSIS — R030 Elevated blood-pressure reading, without diagnosis of hypertension: Secondary | ICD-10-CM | POA: Diagnosis not present

## 2023-10-29 ENCOUNTER — Encounter: Payer: Self-pay | Admitting: *Deleted

## 2023-11-26 DIAGNOSIS — L570 Actinic keratosis: Secondary | ICD-10-CM | POA: Diagnosis not present

## 2023-11-26 DIAGNOSIS — D225 Melanocytic nevi of trunk: Secondary | ICD-10-CM | POA: Diagnosis not present

## 2023-11-26 DIAGNOSIS — L853 Xerosis cutis: Secondary | ICD-10-CM | POA: Diagnosis not present

## 2023-11-26 DIAGNOSIS — L568 Other specified acute skin changes due to ultraviolet radiation: Secondary | ICD-10-CM | POA: Diagnosis not present

## 2023-11-26 DIAGNOSIS — D0439 Carcinoma in situ of skin of other parts of face: Secondary | ICD-10-CM | POA: Diagnosis not present

## 2023-11-26 DIAGNOSIS — D485 Neoplasm of uncertain behavior of skin: Secondary | ICD-10-CM | POA: Diagnosis not present

## 2023-12-17 DIAGNOSIS — Z23 Encounter for immunization: Secondary | ICD-10-CM | POA: Diagnosis not present

## 2023-12-17 DIAGNOSIS — E782 Mixed hyperlipidemia: Secondary | ICD-10-CM | POA: Diagnosis not present

## 2023-12-17 DIAGNOSIS — D472 Monoclonal gammopathy: Secondary | ICD-10-CM | POA: Diagnosis not present

## 2023-12-17 DIAGNOSIS — K2100 Gastro-esophageal reflux disease with esophagitis, without bleeding: Secondary | ICD-10-CM | POA: Diagnosis not present

## 2023-12-17 DIAGNOSIS — L209 Atopic dermatitis, unspecified: Secondary | ICD-10-CM | POA: Diagnosis not present

## 2023-12-17 DIAGNOSIS — Z6830 Body mass index (BMI) 30.0-30.9, adult: Secondary | ICD-10-CM | POA: Diagnosis not present

## 2023-12-25 DIAGNOSIS — M5441 Lumbago with sciatica, right side: Secondary | ICD-10-CM | POA: Diagnosis not present

## 2023-12-25 DIAGNOSIS — M4316 Spondylolisthesis, lumbar region: Secondary | ICD-10-CM | POA: Diagnosis not present

## 2023-12-25 DIAGNOSIS — M5442 Lumbago with sciatica, left side: Secondary | ICD-10-CM | POA: Diagnosis not present

## 2024-01-12 ENCOUNTER — Ambulatory Visit: Admitting: Internal Medicine

## 2024-01-12 DIAGNOSIS — D485 Neoplasm of uncertain behavior of skin: Secondary | ICD-10-CM | POA: Diagnosis not present

## 2024-01-12 DIAGNOSIS — D0439 Carcinoma in situ of skin of other parts of face: Secondary | ICD-10-CM | POA: Diagnosis not present

## 2024-01-12 DIAGNOSIS — L568 Other specified acute skin changes due to ultraviolet radiation: Secondary | ICD-10-CM | POA: Diagnosis not present

## 2024-01-12 DIAGNOSIS — L820 Inflamed seborrheic keratosis: Secondary | ICD-10-CM | POA: Diagnosis not present

## 2024-01-12 DIAGNOSIS — L853 Xerosis cutis: Secondary | ICD-10-CM | POA: Diagnosis not present

## 2024-01-30 DIAGNOSIS — D0439 Carcinoma in situ of skin of other parts of face: Secondary | ICD-10-CM | POA: Diagnosis not present

## 2024-02-09 DIAGNOSIS — L568 Other specified acute skin changes due to ultraviolet radiation: Secondary | ICD-10-CM | POA: Diagnosis not present

## 2024-02-09 DIAGNOSIS — D0472 Carcinoma in situ of skin of left lower limb, including hip: Secondary | ICD-10-CM | POA: Diagnosis not present

## 2024-02-09 DIAGNOSIS — L853 Xerosis cutis: Secondary | ICD-10-CM | POA: Diagnosis not present

## 2024-02-09 DIAGNOSIS — D485 Neoplasm of uncertain behavior of skin: Secondary | ICD-10-CM | POA: Diagnosis not present

## 2024-02-12 DIAGNOSIS — D0439 Carcinoma in situ of skin of other parts of face: Secondary | ICD-10-CM | POA: Diagnosis not present

## 2024-02-29 ENCOUNTER — Other Ambulatory Visit: Payer: Self-pay | Admitting: Internal Medicine

## 2024-03-28 DIAGNOSIS — L853 Xerosis cutis: Secondary | ICD-10-CM | POA: Diagnosis not present

## 2024-03-28 DIAGNOSIS — L570 Actinic keratosis: Secondary | ICD-10-CM | POA: Diagnosis not present

## 2024-03-28 DIAGNOSIS — L568 Other specified acute skin changes due to ultraviolet radiation: Secondary | ICD-10-CM | POA: Diagnosis not present

## 2024-03-28 DIAGNOSIS — Z1283 Encounter for screening for malignant neoplasm of skin: Secondary | ICD-10-CM | POA: Diagnosis not present

## 2024-03-29 DIAGNOSIS — M47816 Spondylosis without myelopathy or radiculopathy, lumbar region: Secondary | ICD-10-CM | POA: Diagnosis not present

## 2024-03-31 ENCOUNTER — Other Ambulatory Visit: Payer: Self-pay

## 2024-03-31 DIAGNOSIS — D472 Monoclonal gammopathy: Secondary | ICD-10-CM

## 2024-03-31 DIAGNOSIS — D649 Anemia, unspecified: Secondary | ICD-10-CM

## 2024-03-31 DIAGNOSIS — E559 Vitamin D deficiency, unspecified: Secondary | ICD-10-CM

## 2024-04-01 ENCOUNTER — Inpatient Hospital Stay: Payer: Medicare HMO | Attending: Hematology

## 2024-04-01 DIAGNOSIS — D3610 Benign neoplasm of peripheral nerves and autonomic nervous system, unspecified: Secondary | ICD-10-CM | POA: Insufficient documentation

## 2024-04-01 DIAGNOSIS — Z85828 Personal history of other malignant neoplasm of skin: Secondary | ICD-10-CM | POA: Insufficient documentation

## 2024-04-01 DIAGNOSIS — D649 Anemia, unspecified: Secondary | ICD-10-CM | POA: Insufficient documentation

## 2024-04-01 DIAGNOSIS — Z85820 Personal history of malignant melanoma of skin: Secondary | ICD-10-CM | POA: Diagnosis not present

## 2024-04-01 DIAGNOSIS — D472 Monoclonal gammopathy: Secondary | ICD-10-CM | POA: Insufficient documentation

## 2024-04-01 DIAGNOSIS — E559 Vitamin D deficiency, unspecified: Secondary | ICD-10-CM | POA: Insufficient documentation

## 2024-04-01 LAB — LACTATE DEHYDROGENASE: LDH: 157 U/L (ref 98–192)

## 2024-04-01 LAB — CBC WITH DIFFERENTIAL/PLATELET
Abs Immature Granulocytes: 0.02 10*3/uL (ref 0.00–0.07)
Basophils Absolute: 0.1 10*3/uL (ref 0.0–0.1)
Basophils Relative: 1 %
Eosinophils Absolute: 0.1 10*3/uL (ref 0.0–0.5)
Eosinophils Relative: 2 %
HCT: 37.5 % — ABNORMAL LOW (ref 39.0–52.0)
Hemoglobin: 12.8 g/dL — ABNORMAL LOW (ref 13.0–17.0)
Immature Granulocytes: 0 %
Lymphocytes Relative: 31 %
Lymphs Abs: 1.8 10*3/uL (ref 0.7–4.0)
MCH: 33.2 pg (ref 26.0–34.0)
MCHC: 34.1 g/dL (ref 30.0–36.0)
MCV: 97.4 fL (ref 80.0–100.0)
Monocytes Absolute: 0.4 10*3/uL (ref 0.1–1.0)
Monocytes Relative: 7 %
Neutro Abs: 3.4 10*3/uL (ref 1.7–7.7)
Neutrophils Relative %: 59 %
Platelets: 156 10*3/uL (ref 150–400)
RBC: 3.85 MIL/uL — ABNORMAL LOW (ref 4.22–5.81)
RDW: 12.7 % (ref 11.5–15.5)
WBC: 5.7 10*3/uL (ref 4.0–10.5)
nRBC: 0 % (ref 0.0–0.2)

## 2024-04-01 LAB — COMPREHENSIVE METABOLIC PANEL WITH GFR
ALT: 35 U/L (ref 0–44)
AST: 30 U/L (ref 15–41)
Albumin: 4.2 g/dL (ref 3.5–5.0)
Alkaline Phosphatase: 53 U/L (ref 38–126)
Anion gap: 10 (ref 5–15)
BUN: 21 mg/dL (ref 8–23)
CO2: 24 mmol/L (ref 22–32)
Calcium: 9.3 mg/dL (ref 8.9–10.3)
Chloride: 105 mmol/L (ref 98–111)
Creatinine, Ser: 0.81 mg/dL (ref 0.61–1.24)
GFR, Estimated: >60 mL/min (ref >60)
Glucose, Bld: 117 mg/dL — ABNORMAL HIGH (ref 70–99)
Potassium: 4.5 mmol/L (ref 3.5–5.1)
Sodium: 139 mmol/L (ref 135–145)
Total Bilirubin: 1 mg/dL (ref 0.0–1.2)
Total Protein: 7.5 g/dL (ref 6.5–8.1)

## 2024-04-04 LAB — PROTEIN ELECTROPHORESIS, SERUM
A/G Ratio: 1.2 (ref 0.7–1.7)
Albumin ELP: 3.8 g/dL (ref 2.9–4.4)
Alpha-1-Globulin: 0.2 g/dL (ref 0.0–0.4)
Alpha-2-Globulin: 0.7 g/dL (ref 0.4–1.0)
Beta Globulin: 1.1 g/dL (ref 0.7–1.3)
Gamma Globulin: 1.2 g/dL (ref 0.4–1.8)
Globulin, Total: 3.1 g/dL (ref 2.2–3.9)
M-Spike, %: 0.3 g/dL — ABNORMAL HIGH
Total Protein ELP: 6.9 g/dL (ref 6.0–8.5)

## 2024-04-04 LAB — KAPPA/LAMBDA LIGHT CHAINS
Kappa free light chain: 24.8 mg/L — ABNORMAL HIGH (ref 3.3–19.4)
Kappa, lambda light chain ratio: 1.25 (ref 0.26–1.65)
Lambda free light chains: 19.9 mg/L (ref 5.7–26.3)

## 2024-04-08 ENCOUNTER — Inpatient Hospital Stay: Payer: Medicare HMO | Admitting: Oncology

## 2024-04-08 VITALS — BP 141/93 | HR 83 | Temp 97.8°F | Resp 16 | Wt 194.2 lb

## 2024-04-08 DIAGNOSIS — D3610 Benign neoplasm of peripheral nerves and autonomic nervous system, unspecified: Secondary | ICD-10-CM | POA: Diagnosis not present

## 2024-04-08 DIAGNOSIS — Z85820 Personal history of malignant melanoma of skin: Secondary | ICD-10-CM | POA: Diagnosis not present

## 2024-04-08 DIAGNOSIS — Z85828 Personal history of other malignant neoplasm of skin: Secondary | ICD-10-CM | POA: Diagnosis not present

## 2024-04-08 DIAGNOSIS — D472 Monoclonal gammopathy: Secondary | ICD-10-CM

## 2024-04-08 DIAGNOSIS — E559 Vitamin D deficiency, unspecified: Secondary | ICD-10-CM

## 2024-04-08 DIAGNOSIS — D649 Anemia, unspecified: Secondary | ICD-10-CM | POA: Diagnosis not present

## 2024-04-08 NOTE — Progress Notes (Signed)
 [facility] [address], [place] [address]   CLINIC:  Medical Oncology/Hematology  PCP:  Leesa Pulling, MD [address] [place] [address] [phone]   REASON FOR VISIT:  Follow-up for IgG kappa MGUS  PRIOR THERAPY: None  CURRENT THERAPY: Surveillance  INTERVAL HISTORY:   Jeffrey [name] 73 y.o. male returns for routine follow-up of IgG kappa MGUS.  He was last seen in clinic on 07/03/2023 by me.  He denies any recent hospitalizations, surgeries or changes in baseline health.    He continues omeprazole  20 mg tablets for hoarseness.  He continues to have low back pain although this appears to be improving.  At today's visit, he reports feeling well.  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  Reports he has had several bouts of radiation instead of surgery due to location.  He had a brain MRI on 08/23/2022 for left-sided hearing loss and was found to have a 2 mm schwannoma.  Follow-up at [facility] in November and self-reports no change with his scan.  He continues OTC vitamin D  2/day.  He denies any new onset bone pains or fractures. No B symptoms such as fever, chills, night sweats, unintentional weight loss.  He denies any new masses or lymphadenopathy.  He has intermittent numbness and tingling in his legs and back which is a chronic problem.  His appetite is 100% and energy levels are 75%.   ASSESSMENT & PLAN:  1.  IgG kappa MGUS: - Seen at the request of Dr. Hildy Lowers. Labs on 06/06/2021 at his office showed normal CBC with hemoglobin 13.3.  SPEP showed M spike of 0.3 g.  Immunofixation shows IgG kappa. - LDH and beta-2  microglobulin normal. - 24-hour urine was negative for M spike on immunofixation. - Skeletal survey on 06/26/2021 showed 3 lucent lesions in the calvarium. - PET scan on 07/25/2021 with no evidence of metabolically active myeloma.  - Skeletal survey on 05/28/2022 showed stable lytic  lesions in the calvarium with no new lesions. -Bone survey from 06/26/2023 showed stable small lytic lesions involving the calvarium.  No other suspicious lytic lesions in the skeleton. .   2.  Mild normocytic anemia -Labs from 12/19/2022 showed normal ferritin, iron panel, copper , B12, MMA and folate.  Vitamin D  low at 22.08.  3.  Schwannoma - Evaluated by ENT (Dr. Ralston Burkes) due to unilateral (left) sensorineural hearing loss and dizziness - MRI brain (08/23/2022) showed 2 mm focus of enhancement within the left internal auditory canal, which may reflect asymmetrically prominent vascular enhancement versus small vestibular schwannoma - He is scheduled for follow-up MRI in November 2024.  4.  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  Vitamin D  deficiency -He was started on vitamin D  supplements which his PCP told him to double.  PLAN: 1. MGUS (monoclonal gammopathy of unknown significance) (Primary) -Labs from 04/01/2024 revealed a creatinine 0.81, calcium  9.3 and a hemoglobin of 12.8.  He denies any new onset of bone pain.  MGUS labs show an M spike of 0.3, elevated kappa free light chain at 24.8 with a normal kappa lambda light chain ratio and normal LDH. - No evidence of CRAB features at this time - Denies any B symptoms. -Repeat bone scan in September 2025.  2. Vitamin D  deficiency -Vitamin D  level 64.57. - Decrease vitamin D  to 1/day and add a multivitamin.  3. Normocytic anemia - Labs from 06/26/2023 show white count of 10.6, hemoglobin 14.0 and MCV 96.5.  Unremarkable differential.  No evidence of renal dysfunction. -Denies any bleeding. -We discussed adding a multivitamin daily and will add on nutritional labs at his next visit. -If any significant changes to his blood work, would recommend bone marrow biopsy.   PLAN SUMMARY: >> Return to clinic in 9 months  for follow-up with labs a few days before and see provider. >> Skeletal survey in September 2025. >> Start a multivitamin and decrease vitamin D  to 1/day.      REVIEW OF SYSTEMS:   Review of Systems  Musculoskeletal:  Positive for back pain.  Neurological:  Positive for headaches.  Psychiatric/Behavioral:  Positive for sleep disturbance.      PHYSICAL EXAM:  ECOG PERFORMANCE STATUS: 1 - Symptomatic but completely ambulatory  There were no vitals filed for this visit. There were no vitals filed for this visit. Physical Exam Constitutional:      Appearance: Normal appearance.   Cardiovascular:     Rate and Rhythm: Normal rate and regular rhythm.  Pulmonary:     Effort: Pulmonary effort is normal.     Breath sounds: Normal breath sounds.  Abdominal:     General: Bowel sounds are normal.     Palpations: Abdomen is soft.   Musculoskeletal:        General: No swelling. Normal range of motion.   Neurological:     Mental Status: He is alert and oriented to person, place, and time. Mental status is at baseline.    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/09/2014   Left Ear Rim (tx p bx)   Nodular basal cell carcinoma (BCC) 12/01/2019   Left Post Auriculaar (tx p bx)   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/2014   Right Forehead (in situ) (curet and 5FU)   SCCA (squamous cell carcinoma) of skin  11/04/2011   Left Outer Eye (curet and 5FU)   Squamous cell carcinoma of skin 03/25/2006   Right Outer Eyebrow (in situ)   Superficial basal cell carcinoma (BCC) 02/07/2014   Right Ear (curet and 5FU)   Superficial nodular basal cell carcinoma 09/25/2015   Right Inner Cheek (curet and 5FU)   Ventral hernia    Wears glasses    Past Surgical History:  Procedure Laterality Date   BACK SURGERY  12/03/2015   Lumbar Fusion Lumber 2-3, 3-4,4-5   BOWEL RESECTION  03/30/2012   Procedure: SMALL BOWEL RESECTION;  Surgeon: Lovena Rubinstein, MD;  Location: [facility];  Service: General;  Laterality: N/A;   COLONOSCOPY N/A 01/10/2014   Procedure: COLONOSCOPY;  Surgeon: Suzette Espy, MD;  [facility];  Service: Endoscopy;  Laterality:  N/A;  1:30 PM   COLONOSCOPY N/A 11/03/2018   Procedure: COLONOSCOPY;  Surgeon: Suzette Espy, MD;  Location: [facility];  Service: Endoscopy;  Laterality: N/A;  7:30   CYSTECTOMY     right thumb 2o yrs   CYSTECTOMY     HERNIA REPAIR     umbilical hernia repair-3-4 yrs ago   INCISIONAL HERNIA REPAIR  10/22/2012   Procedure: LAPAROSCOPIC INCISIONAL HERNIA;  Surgeon: Levert Ready, MD;  Location: [facility];  Service: General;  Laterality: N/A;  incarcerated   INSERTION OF MESH  10/22/2012   Procedure: INSERTION OF MESH;  Surgeon: Levert Ready, MD;  Location: [facility];  Service: General;  Laterality: N/A;   KNEE ARTHROSCOPY     with bone removal-right leg- 2 yrs ago   LAPAROTOMY  03/30/2012   Procedure: EXPLORATORY LAPAROTOMY;  Surgeon: Lovena Rubinstein, MD;  Location: [facility];  Service: General;  Laterality: N/A;   MASS EXCISION  12/11/2011   Procedure: EXCISION MASS;  Surgeon: Dewayne Ford, DPM;  Location: [facility];  Service: Orthopedics;  Laterality: Left;  Excision of plantar fibroma   REPAIR OF PERONEUS BREVIS TENDON Left 03/09/2014   Procedure: LEFT PERONEUS BREVIS LONGUS TENOLYSIS AND TENDON REPAIR ;  Surgeon: Amada Backer, MD;  Location: [facility];  Service: Orthopedics;  Laterality: Left;   WOUND EXPLORATION N/A 02/03/2017   Procedure: EXPLORE ABDOMINAL WOUND, INJECT ABSOLUTE ALCOHOL ;  Surgeon: Boyce Byes, MD;  Location: [facility];  Service: General;  Laterality: N/A;    SOCIAL HISTORY:  Social History   Socioeconomic History   Marital status: Married    Spouse name: Not on file   Number of children: 2   Years of education: college   Highest education level: Not on file  Occupational History   Occupation: Retired   Occupation: Likes to build cars.  Tobacco Use   Smoking status: Never   Smokeless tobacco: Never  Vaping Use   Vaping status: Never Used  Substance and Sexual Activity   Alcohol  use: Not Currently   Drug use: No   Sexual activity: Yes  Other Topics Concern   Not on file  Social History Narrative   Lives at home with his wife.   Right-handed.   Two cups caffeine per day.   Social Drivers of Corporate investment banker Strain: Low Risk  (06/26/2021)   Overall Financial Resource Strain (CARDIA)    Difficulty of Paying Living Expenses: Not hard at all  Food Insecurity: No Food Insecurity (06/26/2021)   Hunger Vital Sign    Worried About Running Out of Food in the Last Year: Never true    Ran Out of Food in the Last Year: Never true  Transportation Needs: No Transportation Needs (06/26/2021)   PRAPARE - Administrator, Civil Service (Medical): No    Lack of Transportation (Non-Medical): No  Physical Activity: Inactive (06/26/2021)   Exercise Vital Sign    Days of Exercise per Week: 7 days    Minutes of Exercise per Session: 0 min  Stress: No Stress Concern Present (06/26/2021)   [employer] of Occupational Health - Occupational Stress Questionnaire    Feeling of Stress : Not at all  Social Connections: Moderately Integrated (06/26/2021)   Social Connection and Isolation Panel    Frequency of Communication with Friends and Family: More than three times a week    Frequency of  Social Gatherings with Friends and Family: More than three  times a week    Attends Religious Services: More than 4 times per year    Active Member of Clubs or Organizations: No    Attends Banker Meetings: Never    Marital Status: Married  Catering manager Violence: Not At Risk (06/26/2021)   Humiliation, Afraid, Rape, and Kick questionnaire    Fear of Current or Ex-Partner: No    Emotionally Abused: No    Physically Abused: No    Sexually Abused: No    FAMILY HISTORY:  Family History  Problem Relation Age of Onset   Stroke Mother    Breast cancer Mother    Dementia Father    Heart disease Father    Bladder Cancer Father    Diabetes Father    Diabetes Brother    Diabetes Brother    Diabetes Brother    Diabetes Brother    Anesthesia problems Neg Hx    Hypotension Neg Hx    Malignant hyperthermia Neg Hx    Pseudochol deficiency Neg Hx     CURRENT MEDICATIONS:  Outpatient Encounter Medications as of 04/08/2024  Medication Sig   aspirin  EC 81 MG tablet Take 81 mg by mouth at bedtime.   atorvastatin  (LIPITOR) 20 MG tablet Take 1 tablet by mouth daily.   esomeprazole (NEXIUM) 20 MG packet Take 20 mg by mouth daily before breakfast.   No facility-administered encounter medications on file as of 04/08/2024.    ALLERGIES:  No Known Allergies  LABORATORY DATA:  I have reviewed the labs as listed.  CBC    Component Value Date/Time   WBC 5.7 04/01/2024 0738   RBC 3.85 (L) 04/01/2024 0738   HGB 12.8 (L) 04/01/2024 0738   HGB 13.4 09/05/2019 1221   HCT 37.5 (L) 04/01/2024 0738   HCT 38.8 09/05/2019 1221   PLT 156 04/01/2024 0738   PLT 166 09/05/2019 1221   MCV 97.4 04/01/2024 0738   MCV 94 09/05/2019 1221   MCH 33.2 04/01/2024 0738   MCHC 34.1 04/01/2024 0738   RDW 12.7 04/01/2024 0738   RDW 12.1 09/05/2019 1221   LYMPHSABS 1.8 04/01/2024 0738   LYMPHSABS 1.5 09/05/2019 1221   MONOABS 0.4 04/01/2024 0738   EOSABS 0.1 04/01/2024 0738   EOSABS 0.1  09/05/2019 1221   BASOSABS 0.1 04/01/2024 0738   BASOSABS 0.0 09/05/2019 1221      Latest Ref Rng & Units 04/01/2024    7:38 AM 06/26/2023    1:01 PM 05/28/2022    9:37 AM  CMP  Glucose 70 - 99 mg/dL 409  811  914   BUN 8 - 23 mg/dL 21  15  17    Creatinine 0.61 - 1.24 mg/dL 7.82  9.56  2.13   Sodium 135 - 145 mmol/L 139  137  138   Potassium 3.5 - 5.1 mmol/L 4.5  3.9  4.5   Chloride 98 - 111 mmol/L 105  100  107   CO2 22 - 32 mmol/L 24  26  25    Calcium  8.9 - 10.3 mg/dL 9.3  9.5  9.4   Total Protein 6.5 - 8.1 g/dL 7.5  7.6  7.7   Total Bilirubin 0.0 - 1.2 mg/dL 1.0  1.4  1.0   Alkaline Phos 38 - 126 U/L 53  48  50   AST 15 - 41 U/L 30  23  25    ALT 0 - 44 U/L 35  30  27     DIAGNOSTIC IMAGING:  I have independently  reviewed the relevant imaging and discussed with the patient.   WRAP UP:  All questions were answered. The patient knows to call the clinic with any problems, questions or concerns.  Medical decision making: Moderate  Time spent on visit: I spent 25 minutes dedicated to the care of this patient (face-to-face and non-face-to-face) on the date of the encounter to include what is described in the assessment and plan.  Aurther Blue, NP  04/08/24 8:03 AM

## 2024-04-09 DIAGNOSIS — D0439 Carcinoma in situ of skin of other parts of face: Secondary | ICD-10-CM | POA: Diagnosis not present

## 2024-04-09 DIAGNOSIS — D0472 Carcinoma in situ of skin of left lower limb, including hip: Secondary | ICD-10-CM | POA: Diagnosis not present

## 2024-04-23 DIAGNOSIS — D0472 Carcinoma in situ of skin of left lower limb, including hip: Secondary | ICD-10-CM | POA: Diagnosis not present

## 2024-04-23 DIAGNOSIS — D0439 Carcinoma in situ of skin of other parts of face: Secondary | ICD-10-CM | POA: Diagnosis not present

## 2024-05-01 DIAGNOSIS — D0472 Carcinoma in situ of skin of left lower limb, including hip: Secondary | ICD-10-CM | POA: Diagnosis not present

## 2024-05-01 DIAGNOSIS — D0439 Carcinoma in situ of skin of other parts of face: Secondary | ICD-10-CM | POA: Diagnosis not present

## 2024-05-23 DIAGNOSIS — D0472 Carcinoma in situ of skin of left lower limb, including hip: Secondary | ICD-10-CM | POA: Diagnosis not present

## 2024-05-23 DIAGNOSIS — D0439 Carcinoma in situ of skin of other parts of face: Secondary | ICD-10-CM | POA: Diagnosis not present

## 2024-06-28 DIAGNOSIS — J0100 Acute maxillary sinusitis, unspecified: Secondary | ICD-10-CM | POA: Diagnosis not present

## 2024-06-28 DIAGNOSIS — J309 Allergic rhinitis, unspecified: Secondary | ICD-10-CM | POA: Diagnosis not present

## 2024-06-28 DIAGNOSIS — Z6830 Body mass index (BMI) 30.0-30.9, adult: Secondary | ICD-10-CM | POA: Diagnosis not present

## 2024-07-07 ENCOUNTER — Encounter (INDEPENDENT_AMBULATORY_CARE_PROVIDER_SITE_OTHER): Payer: Self-pay | Admitting: *Deleted

## 2024-07-19 ENCOUNTER — Ambulatory Visit (HOSPITAL_COMMUNITY)
Admission: RE | Admit: 2024-07-19 | Discharge: 2024-07-19 | Disposition: A | Discharge status: Home / Self Care | Source: Ambulatory Visit | Attending: Oncology | Admitting: Oncology

## 2024-07-19 DIAGNOSIS — D649 Anemia, unspecified: Secondary | ICD-10-CM | POA: Insufficient documentation

## 2024-07-19 DIAGNOSIS — D472 Monoclonal gammopathy: Secondary | ICD-10-CM | POA: Insufficient documentation

## 2024-07-19 DIAGNOSIS — E559 Vitamin D deficiency, unspecified: Secondary | ICD-10-CM | POA: Insufficient documentation

## 2024-07-20 ENCOUNTER — Telehealth: Payer: Self-pay | Admitting: *Deleted

## 2024-07-20 NOTE — Telephone Encounter (Signed)
 LMOVM to call back to do triage for colonoscopy over the phone and then will send to Dr. Shaaron

## 2024-07-20 NOTE — Telephone Encounter (Signed)
 Estimated body mass index is 29.53 kg/m as calculated from the following:   Height as of 12/19/22: 5' 8 (1.727 m).   Weight as of 04/08/24: 194 lb 3.6 oz (88.1 kg).  Do you have family history of colon cancer?  no  Do you have a family history of polyps? no  Previous colonoscopy with polyps removed? yes  Do you have a history colorectal cancer?   no  Are you diabetic?  no  Do you have a prosthetic or mechanical heart valve? no  Do you have a pacemaker/defibrillator?   no  Have you had endocarditis/atrial fibrillation?  no  Do you use supplemental oxygen/CPAP?  no  Have you had joint replacement within the last 12 months?  no  Do you tend to be constipated or have to use laxatives?  Veggie laxative daily   Do you have history of alcohol  use? If yes, how much and how often.  no  Do you have history or are you using drugs? If yes, what do are you  using?  no  Have you ever had a stroke/heart attack?  no  Have you ever had a heart or other vascular stent placed,? no  Do you take weight loss medication? no  Do you take any blood-thinning medications such as: (Plavix, aspirin , Coumadin, Aggrenox, Brilinta, Xarelto, Eliquis, Pradaxa, Savaysa or Effient)? Aspirin  81mg   If yes we need the name, milligram, dosage and who is prescribing doctor:               Current Outpatient Medications on File Prior to Visit  Medication Sig Dispense Refill   aspirin  EC 81 MG tablet Take 81 mg by mouth at bedtime.     atorvastatin  (LIPITOR) 20 MG tablet Take 1 tablet by mouth daily.     cyclobenzaprine (FLEXERIL) 5 MG tablet Take 1-2 tablet(s) by oral route every 8 hours as needed for muscle spasms.     esomeprazole (NEXIUM) 20 MG packet Take 20 mg by mouth daily before breakfast.     Multiple Vitamin (MULTIVITAMIN) tablet Take 1 tablet by mouth daily.     OVER THE COUNTER MEDICATION Veggie laxative daily     No current facility-administered medications on file prior to visit.      No  Known Allergies

## 2024-07-21 NOTE — Telephone Encounter (Signed)
LMOVM to call back to schedule 

## 2024-07-22 ENCOUNTER — Encounter: Payer: Self-pay | Admitting: *Deleted

## 2024-07-22 ENCOUNTER — Other Ambulatory Visit: Payer: Self-pay | Admitting: *Deleted

## 2024-07-22 MED ORDER — PEG 3350-KCL-NA BICARB-NACL 420 G PO SOLR: 4000.0000 mL | Freq: Once | ORAL | 0 refills | Status: AC

## 2024-07-22 NOTE — Telephone Encounter (Signed)
 Pt has been scheduled for 08/18/24. Instructions mailed and prep sent to pharmacy

## 2024-07-25 ENCOUNTER — Encounter (INDEPENDENT_AMBULATORY_CARE_PROVIDER_SITE_OTHER): Payer: Self-pay | Admitting: *Deleted

## 2024-07-25 NOTE — Telephone Encounter (Signed)
 Referral completed, [facility] apt letter sent to PCP

## 2024-08-18 ENCOUNTER — Encounter (HOSPITAL_COMMUNITY): Payer: Self-pay | Admitting: Internal Medicine

## 2024-08-18 ENCOUNTER — Other Ambulatory Visit: Payer: Self-pay

## 2024-08-18 ENCOUNTER — Telehealth: Payer: Self-pay

## 2024-08-18 ENCOUNTER — Ambulatory Visit (HOSPITAL_COMMUNITY)

## 2024-08-18 ENCOUNTER — Encounter (HOSPITAL_COMMUNITY)
Admission: RE | Disposition: A | Discharge status: Home / Self Care | Payer: Self-pay | Source: Home / Self Care | Attending: Internal Medicine

## 2024-08-18 ENCOUNTER — Ambulatory Visit (HOSPITAL_COMMUNITY)
Admission: RE | Admit: 2024-08-18 | Discharge: 2024-08-18 | Disposition: A | Discharge status: Home / Self Care | Attending: Internal Medicine | Admitting: Internal Medicine

## 2024-08-18 DIAGNOSIS — Z1211 Encounter for screening for malignant neoplasm of colon: Secondary | ICD-10-CM | POA: Insufficient documentation

## 2024-08-18 DIAGNOSIS — Z8371 Family history of adenomatous and serrated polyps: Secondary | ICD-10-CM | POA: Diagnosis not present

## 2024-08-18 DIAGNOSIS — K5730 Diverticulosis of large intestine without perforation or abscess without bleeding: Secondary | ICD-10-CM | POA: Insufficient documentation

## 2024-08-18 DIAGNOSIS — K640 First degree hemorrhoids: Secondary | ICD-10-CM | POA: Insufficient documentation

## 2024-08-18 DIAGNOSIS — Z860100 Personal history of colon polyps, unspecified: Secondary | ICD-10-CM

## 2024-08-18 DIAGNOSIS — Z860101 Personal history of adenomatous and serrated colon polyps: Secondary | ICD-10-CM | POA: Diagnosis not present

## 2024-08-18 DIAGNOSIS — K219 Gastro-esophageal reflux disease without esophagitis: Secondary | ICD-10-CM | POA: Diagnosis not present

## 2024-08-18 HISTORY — PX: COLONOSCOPY: SHX5424

## 2024-08-18 SURGERY — COLONOSCOPY
Anesthesia: General

## 2024-08-18 MED ORDER — PROPOFOL 10 MG/ML IV BOLUS
INTRAVENOUS | Status: DC | PRN
  Administered 2024-08-18: 100 mg via INTRAVENOUS

## 2024-08-18 MED ORDER — PHENYLEPHRINE 80 MCG/ML (10ML) SYRINGE FOR IV PUSH (FOR BLOOD PRESSURE SUPPORT)
PREFILLED_SYRINGE | INTRAVENOUS | Status: DC | PRN
  Administered 2024-08-18: 160 ug via INTRAVENOUS

## 2024-08-18 MED ORDER — LACTATED RINGERS IV SOLN: INTRAVENOUS | Status: DC

## 2024-08-18 MED ORDER — LINACLOTIDE 145 MCG PO CAPS
145.0000 ug | ORAL_CAPSULE | Freq: Every day | ORAL | 11 refills | Status: AC
Start: 2024-08-18 — End: ?

## 2024-08-18 MED ORDER — PROPOFOL 500 MG/50ML IV EMUL
INTRAVENOUS | Status: DC | PRN
  Administered 2024-08-18: 200 ug/kg/min via INTRAVENOUS

## 2024-08-18 NOTE — H&P (Signed)
 @LOGO @   Gastroenterology Progress Note    Primary Care Physician:  Toribio Jerel MATSU, MD Primary Gastroenterologist:  Dr. Shaaron  Pre-Procedure History & Physical: HPI:  Jeffrey [name] is a 73 y.o. male here for surveillance colonoscopy.  History of colonic adenoma removed 2015.  Also, a family history of colonic adenoma first-degree relative.  No bowel symptoms at this time.  Past Medical History:  Diagnosis Date   Abdominal distension    Abdominal pain    Atypical mole 05/31/2001   Right Clavicle (marked) (excision)   Basal cell carcinoma 11/04/2011   Left Post Shoulder (excision)   BCC (basal cell carcinoma of skin) 11/04/2011   Right Post Neck (curet and 5FU)   BCC (basal cell carcinoma of skin) 08/09/2014   Left Post Auricular (sclerosing) (tx p bx)   Cancer (HCC)    skin cancer-chest and neck Melonoma right side of chest   Dizziness    GERD (gastroesophageal reflux disease)    Hypercholesteremia    Infiltrative basal cell carcinoma (BCC) 08/09/2014   Left Ear Rim (tx p bx)   Nodular basal cell carcinoma (BCC) 12/01/2019   Left Post Auriculaar (tx p bx)   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/2014   Right Forehead (in situ) (curet and 5FU)   SCCA (squamous cell carcinoma) of skin 11/04/2011   Left Outer Eye (curet and 5FU)   Squamous cell carcinoma of skin 03/25/2006   Right Outer Eyebrow (in situ)   Superficial basal cell carcinoma (BCC) 02/07/2014   Right Ear (curet and 5FU)   Superficial nodular basal cell carcinoma 09/25/2015   Right Inner Cheek (curet and 5FU)   Ventral hernia    Wears glasses     Past Surgical History:  Procedure Laterality Date   BACK SURGERY  12/03/2015   Lumbar Fusion Lumber 2-3, 3-4,4-5   BOWEL RESECTION  03/30/2012   Procedure: SMALL BOWEL RESECTION;  Surgeon: Thresa JAYSON Pulling, MD;  Location: [facility];  Service: General;  Laterality: N/A;   COLONOSCOPY N/A 01/10/2014   Procedure: COLONOSCOPY;  Surgeon: Lamar CHRISTELLA Shaaron, MD;   [facility];  Service: Endoscopy;  Laterality: N/A;  1:30 PM   COLONOSCOPY N/A 11/03/2018   Procedure: COLONOSCOPY;  Surgeon: Shaaron Lamar CHRISTELLA, MD;  Location: [facility];  Service: Endoscopy;  Laterality: N/A;  7:30   CYSTECTOMY     right thumb 2o yrs   CYSTECTOMY     HERNIA REPAIR     umbilical hernia repair-3-4 yrs ago   INCISIONAL HERNIA REPAIR  10/22/2012   Procedure: LAPAROSCOPIC INCISIONAL HERNIA;  Surgeon: Elon CHRISTELLA Pacini, MD;  Location: [facility];  Service: General;  Laterality: N/A;  incarcerated   INSERTION OF MESH  10/22/2012   Procedure: INSERTION OF MESH;  Surgeon: Elon CHRISTELLA Pacini, MD;  Location: [facility];  Service: General;  Laterality: N/A;   KNEE ARTHROSCOPY     with bone removal-right leg- 2 yrs ago   LAPAROTOMY  03/30/2012   Procedure: EXPLORATORY LAPAROTOMY;  Surgeon: Thresa JAYSON Pulling, MD;  Location: [facility];  Service: General;  Laterality: N/A;   MASS EXCISION  12/11/2011   Procedure: EXCISION MASS;  Surgeon: Morene Donley Anon, DPM;  Location: [facility];  Service: Orthopedics;  Laterality: Left;  Excision of plantar fibroma   REPAIR OF PERONEUS BREVIS TENDON Left 03/09/2014   Procedure: LEFT PERONEUS BREVIS LONGUS TENOLYSIS AND TENDON REPAIR ;  Surgeon: Norleen Armor, MD;  Location: [facility];  Service: Orthopedics;  Laterality: Left;   WOUND EXPLORATION N/A 02/03/2017   Procedure: EXPLORE ABDOMINAL WOUND, INJECT ABSOLUTE ALCOHOL ;  Surgeon: Elon Pacini, MD;  Location: [facility];  Service: General;  Laterality: N/A;    Prior to Admission medications   Medication Sig Start Date End Date Taking? Authorizing Provider  aspirin  EC 81 MG tablet Take 81 mg by mouth at bedtime.   Yes [provider]  atorvastatin  (LIPITOR) 20 MG tablet Take 1 tablet by mouth daily. 02/01/19  Yes [provider]  cyclobenzaprine (FLEXERIL) 5 MG tablet Take 1-2 tablet(s) by oral route every 8 hours as needed for muscle spasms. 01/09/23  Yes [provider]   esomeprazole (NEXIUM) 20 MG packet Take 20 mg by mouth daily before breakfast.   Yes [provider]  Multiple Vitamin (MULTIVITAMIN) tablet Take 1 tablet by mouth daily.   Yes [provider]  OVER THE COUNTER MEDICATION Veggie laxative daily   Yes [provider]    Allergies as of 07/22/2024   (No Known Allergies)    Family History  Problem Relation Age of Onset   Stroke Mother    Breast cancer Mother    Dementia Father    Heart disease Father    Bladder Cancer Father    Diabetes Father    Diabetes Brother    Diabetes Brother    Diabetes Brother    Diabetes Brother    Anesthesia problems Neg Hx    Hypotension Neg Hx    Malignant hyperthermia Neg Hx    Pseudochol deficiency Neg Hx     Social History   Socioeconomic History   Marital status: Married    Spouse name: Not on file   Number of children: 2   Years of education: college   Highest education level: Not on file  Occupational History   Occupation: Retired   Occupation: Likes to build cars.  Tobacco Use   Smoking status: Never   Smokeless tobacco: Never  Vaping Use   Vaping status: Never Used  Substance and Sexual Activity   Alcohol  use: Not Currently   Drug use: No   Sexual activity: Yes  Other Topics Concern   Not on file  Social History Narrative   Lives at home with his wife.   Right-handed.   Two cups caffeine per day.   Social Drivers of Corporate Investment Banker Strain: Low Risk  (06/26/2021)   Overall Financial Resource Strain (CARDIA)    Difficulty of Paying Living Expenses: Not hard at all  Food Insecurity: No Food Insecurity (06/26/2021)   Hunger Vital Sign    Worried About Running Out of Food in the Last Year: Never true    Ran Out of Food in the Last Year: Never true  Transportation Needs: No Transportation Needs (06/26/2021)   PRAPARE - Administrator, Civil Service (Medical): No    Lack of Transportation (Non-Medical): No  Physical Activity:  Inactive (06/26/2021)   Exercise Vital Sign    Days of Exercise per Week: 7 days    Minutes of Exercise per Session: 0 min  Stress: No Stress Concern Present (06/26/2021)   [employer] of Occupational Health - Occupational Stress Questionnaire    Feeling of Stress : Not at all  Social Connections: Moderately Integrated (06/26/2021)   Social Connection and Isolation Panel    Frequency of Communication with Friends and Family: More than three times a week    Frequency of Social Gatherings with Friends and Family: More  than three times a week    Attends Religious Services: More than 4 times per year    Active Member of Clubs or Organizations: No    Attends Banker Meetings: Never    Marital Status: Married  Catering Manager Violence: Not At Risk (06/26/2021)   Humiliation, Afraid, Rape, and Kick questionnaire    Fear of Current or Ex-Partner: No    Emotionally Abused: No    Physically Abused: No    Sexually Abused: No    Review of Systems   See HPI, otherwise negative ROS  Physical Exam: BP (!) 155/84   Pulse 79   Temp 98.6 F (37 C) (Oral)   Resp 16   Ht 5' 8 (1.727 m)   Wt 87.1 kg   SpO2 97%   BMI 29.19 kg/m  General:   Alert,  Well-developed, well-nourished, pleasant and cooperative in NAD Neck:  Supple; no masses or thyromegaly. No significant cervical adenopathy. Lungs:  Clear throughout to auscultation.   No wheezes, crackles, or rhonchi. No acute distress. Heart:  Regular rate and rhythm; no murmurs, clicks, rubs,  or gallops. Abdomen: Non-distended, normal bowel sounds.  Soft and nontender without appreciable mass or hepatosplenomegaly.    Impression/Plan:   73 year old gentleman with a personal and family history of colonic adenomas; he is here for surveillance examination per plan. The risks, benefits, limitations, alternatives and imponderables have been reviewed with the patient. Questions have been answered. All parties are agreeable.       Notice: This dictation was prepared with Dragon dictation along with smaller phrase technology. Any transcriptional errors that result from this process are unintentional and may not be corrected upon review.

## 2024-08-18 NOTE — Transfer of Care (Signed)
 Immediate Anesthesia Transfer of Care Note  Patient: Jeffrey [name]  Procedure(s) Performed: COLONOSCOPY  Patient Location: Endoscopy Unit  Anesthesia Type:General  Level of Consciousness: awake, alert , oriented, and patient cooperative  Airway & Oxygen Therapy: Patient Spontanous Breathing  Post-op Assessment: Report given to RN, Post -op Vital signs reviewed and stable, and Patient moving all extremities X 4  Post vital signs: Reviewed and stable  Last Vitals:  Vitals Value Taken Time  BP 106/46 08/18/24 09:05  Temp 37.1 C 08/18/24 09:05  Pulse 65 08/18/24 09:05  Resp 21 08/18/24 09:05  SpO2 96 % 08/18/24 09:05    Last Pain:  Vitals:   08/18/24 0905  TempSrc: Oral  PainSc:       Patients Stated Pain Goal: 5 (08/18/24 0744)  Complications: No notable events documented.

## 2024-08-18 NOTE — Telephone Encounter (Signed)
 Rx sent to pharmacy on file.

## 2024-08-18 NOTE — Op Note (Addendum)
 [facility] Patient Name: Jeffrey [name] Procedure Date: 08/18/2024 8:04 AM MRN: [id number] Date of Birth: 1951/07/16 Attending MD: Lamar Ozell Hollingshead , MD, [id number] CSN: [id number] Age: 73 Admit Type: Outpatient Procedure:                Colonoscopy Indications:              High risk colon cancer surveillance: Personal                            history of colonic polyps Providers:                Lamar Ozell Hollingshead, MD, Devere Lodge, Madelin Hunter,                            RN Referring MD:              Medicines:                Propofol  per Anesthesia Complications:            No immediate complications. Estimated Blood Loss:     Estimated blood loss: none. Procedure:                Pre-Anesthesia Assessment:                           - Prior to the procedure, a History and Physical                            was performed, and patient medications and                            allergies were reviewed. The patient's tolerance of                            previous anesthesia was also reviewed. The risks                            and benefits of the procedure and the sedation                            options and risks were discussed with the patient.                            All questions were answered, and informed consent                            was obtained. Prior Anticoagulants: The patient has                            taken no anticoagulant or antiplatelet agents. ASA                            Grade Assessment: II - A patient with mild systemic  disease. After reviewing the risks and benefits,                            the patient was deemed in satisfactory condition to                            undergo the procedure.                           After obtaining informed consent, the colonoscope                            was passed under direct vision. Throughout the                            procedure, the patient's blood  pressure, pulse, and                            oxygen saturations were monitored continuously. The                            [id number] ([id number]) Colon was introduced through                            the anus and advanced to the the cecum, identified                            by appendiceal orifice and ileocecal valve. The                            colonoscopy was performed without difficulty. The                            patient tolerated the procedure well. The quality                            of the bowel preparation was adequate. The terminal                            ileum, ileocecal valve, appendiceal orifice, and                            rectum were photographed. Scope In: 8:49:50 AM Scope Out: 9:02:09 AM Scope Withdrawal Time: 0 hours 9 minutes 23 seconds  Total Procedure Duration: 0 hours 12 minutes 19 seconds  Findings:      The perianal and digital rectal examinations were normal.      Internal hemorrhoids were found during retroflexion. The hemorrhoids       were mild, small and Grade I (internal hemorrhoids that do not prolapse).      Scattered small-mouthed diverticula were found in the sigmoid colon and       descending colon.      The exam was otherwise without abnormality on direct and retroflexion       views. Distal 10 cm of TI appeared normal Impression:               -  Internal hemorrhoids.                           - Diverticulosis in the sigmoid colon and in the                            descending colon.                           - The examination was otherwise normal on direct                            and retroflexion views. Distal 10 cm TI. Normal                           - No specimens collected. Moderate Sedation:      Moderate (conscious) sedation was personally administered by an       anesthesia professional. The following parameters were monitored: oxygen       saturation, heart rate, blood pressure, respiratory rate, EKG, adequacy        of pulmonary ventilation, and response to care. Recommendation:           Constipation suboptimally managed with Senokot and-                            Patient has a contact number available for                            emergencies. The signs and symptoms of potential                            delayed complications were discussed with the                            patient. Return to normal activities tomorrow.                            Written discharge instructions were provided to the                            patient.                           - Advance diet as tolerated. MiraLAX  and Senokot                            not working very well for constipation. Stop both.                            Begin Linzess 145 daily. Procedure Code(s):        --- Professional ---                           [phone], Colonoscopy, flexible; diagnostic, including  collection of specimen(s) by brushing or washing,                            when performed (separate procedure) Diagnosis Code(s):        --- Professional ---                           Z86.010, Personal history of colonic polyps                           K64.0, First degree hemorrhoids                           K57.30, Diverticulosis of large intestine without                            perforation or abscess without bleeding CPT copyright 2022 [employer]. All rights reserved. The codes documented in this report are preliminary and upon coder review may  be revised to meet current compliance requirements. Lamar HERO. Topanga Alvelo, MD Lamar Ozell Hollingshead, MD 08/18/2024 9:10:14 AM This report has been signed electronically. Number of Addenda: 0

## 2024-08-18 NOTE — Anesthesia Postprocedure Evaluation (Signed)
 Anesthesia Post Note  Patient: JAIVEON SUPPES  Procedure(s) Performed: COLONOSCOPY  Patient location during evaluation: PACU Anesthesia Type: General Level of consciousness: awake and alert Pain management: pain level controlled Vital Signs Assessment: post-procedure vital signs reviewed and stable Respiratory status: spontaneous breathing, nonlabored ventilation, respiratory function stable and patient connected to nasal cannula oxygen Cardiovascular status: blood pressure returned to baseline and stable Postop Assessment: no apparent nausea or vomiting Anesthetic complications: no   No notable events documented.   Last Vitals:  Vitals:   08/18/24 0744 08/18/24 0905  BP: (!) 155/84 (!) 106/46  Pulse: 79 65  Resp: 16 (!) 21  Temp: 37 C 37.1 C  SpO2: 97% 96%    Last Pain:  Vitals:   08/18/24 0907  TempSrc:   PainSc: 0-No pain                 Andrea Limes

## 2024-08-18 NOTE — Discharge Instructions (Addendum)
  Colonoscopy Discharge Instructions  Read the instructions outlined below and refer to this sheet in the next few weeks. These discharge instructions provide you with general information on caring for yourself after you leave the hospital. Your doctor may also give you specific instructions. While your treatment has been planned according to the most current medical practices available, unavoidable complications occasionally occur. If you have any problems or questions after discharge, call Dr. Shaaron at [phone]. ACTIVITY You may resume your regular activity, but move at a slower pace for the next 24 hours.  Take frequent rest periods for the next 24 hours.  Walking will help get rid of the air and reduce the bloated feeling in your belly (abdomen).  No driving for 24 hours (because of the medicine (anesthesia) used during the test).   Do not sign any important legal documents or operate any machinery for 24 hours (because of the anesthesia used during the test).  NUTRITION Drink plenty of fluids.  You may resume your normal diet as instructed by your doctor.  Begin with a light meal and progress to your normal diet. Heavy or fried foods are harder to digest and may make you feel sick to your stomach (nauseated).  Avoid alcoholic beverages for 24 hours or as instructed.  MEDICATIONS You may resume your normal medications unless your doctor tells you otherwise.  WHAT YOU CAN EXPECT TODAY Some feelings of bloating in the abdomen.  Passage of more gas than usual.  Spotting of blood in your stool or on the toilet paper.  IF YOU HAD POLYPS REMOVED DURING THE COLONOSCOPY: No aspirin  products for 7 days or as instructed.  No alcohol  for 7 days or as instructed.  Eat a soft diet for the next 24 hours.  FINDING OUT THE RESULTS OF YOUR TEST Not all test results are available during your visit. If your test results are not back during the visit, make an appointment with your caregiver to find out the  results. Do not assume everything is normal if you have not heard from your caregiver or the medical facility. It is important for you to follow up on all of your test results.  SEEK IMMEDIATE MEDICAL ATTENTION IF: You have more than a spotting of blood in your stool.  Your belly is swollen (abdominal distention).  You are nauseated or vomiting.  You have a temperature over 101.  You have abdominal pain or discomfort that is severe or gets worse throughout the day.     You have mild diverticulosis.  No polyps.  A future colonoscopy is not recommended unless new symptoms develop  For your constipation, lets stop MiraLAX  and Senokot.  Trial of Linzess 145 gelcap  -take 1 daily daily.  New prescription is being called to your pharmacy from my office.  Let me know in 4 weeks how you are doing with bowel function and we will go from there.

## 2024-08-18 NOTE — Telephone Encounter (Signed)
-----   Message from Lamar Hollingshead sent at 08/18/2024  9:21 AM EDT ----- New prescription Linzess 145 gelcap.  Take 1 daily.  Dispense 30 with 11 refills.  Thanks.

## 2024-08-18 NOTE — Anesthesia Preprocedure Evaluation (Signed)
 Anesthesia Evaluation  Patient identified by MRN, date of birth, ID band Patient awake    Reviewed: Allergy & Precautions, H&P , NPO status , Patient's Chart, lab work & pertinent test results  Airway Mallampati: II  TM Distance: >3 FB Neck ROM: Full    Dental no notable dental hx.    Pulmonary neg pulmonary ROS   Pulmonary exam normal breath sounds clear to auscultation       Cardiovascular negative cardio ROS Normal cardiovascular exam Rhythm:Regular Rate:Normal     Neuro/Psych  Neuromuscular disease  negative psych ROS   GI/Hepatic Neg liver ROS,GERD  ,,  Endo/Other  negative endocrine ROS    Renal/GU negative Renal ROS  negative genitourinary   Musculoskeletal negative musculoskeletal ROS (+)    Abdominal   Peds negative pediatric ROS (+)  Hematology negative hematology ROS (+)   Anesthesia Other Findings   Reproductive/Obstetrics negative OB ROS                              Anesthesia Physical Anesthesia Plan  ASA: 2  Anesthesia Plan: General   Post-op Pain Management:    Induction: Intravenous  PONV Risk Score and Plan:   Airway Management Planned: Nasal Cannula  Additional Equipment:   Intra-op Plan:   Post-operative Plan:   Informed Consent: I have reviewed the patients History and Physical, chart, labs and discussed the procedure including the risks, benefits and alternatives for the proposed anesthesia with the patient or authorized representative who has indicated his/her understanding and acceptance.     Dental advisory given  Plan Discussed with: CRNA  Anesthesia Plan Comments:         Anesthesia Quick Evaluation

## 2024-08-19 ENCOUNTER — Encounter (HOSPITAL_COMMUNITY): Payer: Self-pay | Admitting: Internal Medicine

## 2024-09-01 DIAGNOSIS — D333 Benign neoplasm of cranial nerves: Secondary | ICD-10-CM | POA: Diagnosis not present

## 2024-09-01 DIAGNOSIS — H903 Sensorineural hearing loss, bilateral: Secondary | ICD-10-CM | POA: Diagnosis not present

## 2024-09-01 DIAGNOSIS — H7401 Tympanosclerosis, right ear: Secondary | ICD-10-CM | POA: Diagnosis not present

## 2024-09-01 DIAGNOSIS — Z974 Presence of external hearing-aid: Secondary | ICD-10-CM | POA: Diagnosis not present

## 2024-09-01 DIAGNOSIS — H918X2 Other specified hearing loss, left ear: Secondary | ICD-10-CM | POA: Diagnosis not present

## 2024-09-01 DIAGNOSIS — H9191 Unspecified hearing loss, right ear: Secondary | ICD-10-CM | POA: Diagnosis not present

## 2024-09-01 DIAGNOSIS — H938X3 Other specified disorders of ear, bilateral: Secondary | ICD-10-CM | POA: Diagnosis not present

## 2024-09-27 DIAGNOSIS — M47816 Spondylosis without myelopathy or radiculopathy, lumbar region: Secondary | ICD-10-CM | POA: Diagnosis not present

## 2024-09-27 DIAGNOSIS — M5416 Radiculopathy, lumbar region: Secondary | ICD-10-CM | POA: Diagnosis not present

## 2024-09-28 ENCOUNTER — Telehealth: Payer: Self-pay | Admitting: Internal Medicine

## 2024-09-28 NOTE — Telephone Encounter (Signed)
 Patient called me last night.  Was complaining of intermittent abdominal pain lancinating.  Worsening of pain when pressure is applied against his abdomen from time to time.  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  Ventral hernia development subsequently requiring mesh and alcohol  neurolysis of abdominal wall pain done by Dr. Elon Pacini previously.  I know the patient personally.  I did drive over his home last night and examined him.  Well-healed surgical scars.  I did not detect a fascial defect.  He does have a slightly asymmetrical bulge when he attempts to sit up- left greater than right but again I do not detect hernia.  He is concerned about recurrent issue and pain which comes and goes which I suspect is abdominal wall in origin and may have something to do with the mesh.  He enjoyed significant improvement with alcohol  ablation previously by Dr. Pacini.  Pacini is now retired.  I will get him back down to [facility] surgery so he surgeon can examine his abdomen and offer recommendations.

## 2024-10-24 ENCOUNTER — Other Ambulatory Visit: Payer: Self-pay | Admitting: General Surgery

## 2024-10-24 DIAGNOSIS — K430 Incisional hernia with obstruction, without gangrene: Secondary | ICD-10-CM

## 2024-10-28 ENCOUNTER — Ambulatory Visit
Admission: RE | Admit: 2024-10-28 | Discharge: 2024-10-28 | Disposition: A | Source: Ambulatory Visit | Attending: General Surgery

## 2024-10-28 DIAGNOSIS — K430 Incisional hernia with obstruction, without gangrene: Secondary | ICD-10-CM

## 2024-10-28 MED ORDER — IOPAMIDOL (ISOVUE-300) INJECTION 61%
100.0000 mL | Freq: Once | INTRAVENOUS | Status: AC | PRN
  Administered 2024-10-28: 100 mL via INTRAVENOUS

## 2024-11-01 ENCOUNTER — Other Ambulatory Visit: Payer: Self-pay

## 2024-11-01 MED ORDER — LINACLOTIDE 72 MCG PO CAPS: 72.0000 ug | ORAL_CAPSULE | Freq: Every day | ORAL | Status: AC

## 2024-11-01 NOTE — Progress Notes (Signed)
 Medication Samples have been provided to the patient.  Drug name: Linzess        Strength: 72 mcg        Qty: 4  LOT: [id number]  Exp.Date: 11/19/2026  Dosing instructions: Take one capsule daily 30 mins before breakfast  The patient has been instructed regarding the correct time, dose, and frequency of taking this medication, including desired effects and most common side effects.   Jeffrey [name] 2:24 PM 11/01/2024

## 2024-11-09 ENCOUNTER — Ambulatory Visit (HOSPITAL_COMMUNITY)

## 2024-12-30 ENCOUNTER — Other Ambulatory Visit

## 2025-01-06 ENCOUNTER — Ambulatory Visit: Admitting: Oncology

## 2025-01-13 ENCOUNTER — Other Ambulatory Visit

## 2025-01-20 ENCOUNTER — Inpatient Hospital Stay: Admitting: Oncology

## 2025-01-20 ENCOUNTER — Ambulatory Visit: Admitting: Oncology
